# Patient Record
Sex: Male | Born: 1935 | Race: White | Hispanic: No | Smoking: Former smoker
Health system: Southern US, Community
[De-identification: ages and names within clinical notes are randomized; demographics above are authoritative.]

## PROBLEM LIST (undated history)

## (undated) DIAGNOSIS — R269 Unspecified abnormalities of gait and mobility: Secondary | ICD-10-CM

## (undated) DIAGNOSIS — R42 Dizziness and giddiness: Secondary | ICD-10-CM

## (undated) DIAGNOSIS — I6529 Occlusion and stenosis of unspecified carotid artery: Secondary | ICD-10-CM

## (undated) DIAGNOSIS — E785 Hyperlipidemia, unspecified: Secondary | ICD-10-CM

## (undated) DIAGNOSIS — G7000 Myasthenia gravis without (acute) exacerbation: Secondary | ICD-10-CM

## (undated) DIAGNOSIS — M545 Low back pain, unspecified: Secondary | ICD-10-CM

## (undated) DIAGNOSIS — I10 Essential (primary) hypertension: Secondary | ICD-10-CM

## (undated) DIAGNOSIS — R609 Edema, unspecified: Secondary | ICD-10-CM

## (undated) DIAGNOSIS — R600 Localized edema: Secondary | ICD-10-CM

## (undated) DIAGNOSIS — H9190 Unspecified hearing loss, unspecified ear: Secondary | ICD-10-CM

## (undated) DIAGNOSIS — Z923 Personal history of irradiation: Secondary | ICD-10-CM

## (undated) DIAGNOSIS — N400 Enlarged prostate without lower urinary tract symptoms: Secondary | ICD-10-CM

## (undated) DIAGNOSIS — C3490 Malignant neoplasm of unspecified part of unspecified bronchus or lung: Secondary | ICD-10-CM

## (undated) DIAGNOSIS — I8290 Acute embolism and thrombosis of unspecified vein: Secondary | ICD-10-CM

## (undated) DIAGNOSIS — I739 Peripheral vascular disease, unspecified: Secondary | ICD-10-CM

## (undated) DIAGNOSIS — N200 Calculus of kidney: Secondary | ICD-10-CM

## (undated) DIAGNOSIS — M1990 Unspecified osteoarthritis, unspecified site: Secondary | ICD-10-CM

## (undated) HISTORY — DX: Unspecified hearing loss, unspecified ear: H91.90

## (undated) HISTORY — DX: Hyperlipidemia, unspecified: E78.5

## (undated) HISTORY — DX: Low back pain: M54.5

## (undated) HISTORY — PX: CATARACT EXTRACTION: SUR2

## (undated) HISTORY — DX: Localized edema: R60.0

## (undated) HISTORY — DX: Low back pain, unspecified: M54.50

## (undated) HISTORY — DX: Unspecified abnormalities of gait and mobility: R26.9

## (undated) HISTORY — DX: Malignant neoplasm of unspecified part of unspecified bronchus or lung: C34.90

## (undated) HISTORY — DX: Dizziness and giddiness: R42

## (undated) HISTORY — DX: Occlusion and stenosis of unspecified carotid artery: I65.29

## (undated) HISTORY — DX: Edema, unspecified: R60.9

## (undated) HISTORY — DX: Benign prostatic hyperplasia without lower urinary tract symptoms: N40.0

## (undated) HISTORY — DX: Myasthenia gravis without (acute) exacerbation: G70.00

## (undated) HISTORY — PX: THYROIDECTOMY, PARTIAL: SHX18

## (undated) HISTORY — PX: APPENDECTOMY: SHX54

## (undated) HISTORY — DX: Personal history of irradiation: Z92.3

## (undated) HISTORY — DX: Calculus of kidney: N20.0

## (undated) HISTORY — DX: Essential (primary) hypertension: I10

## (undated) HISTORY — DX: Peripheral vascular disease, unspecified: I73.9

## (undated) HISTORY — DX: Unspecified osteoarthritis, unspecified site: M19.90

## (undated) HISTORY — DX: Acute embolism and thrombosis of unspecified vein: I82.90

---

## 1999-07-30 ENCOUNTER — Encounter: Admission: RE | Admit: 1999-07-30 | Discharge: 1999-10-28 | Payer: Self-pay | Admitting: Orthopedic Surgery

## 2006-05-14 ENCOUNTER — Emergency Department (HOSPITAL_COMMUNITY): Admission: EM | Admit: 2006-05-14 | Discharge: 2006-05-14 | Payer: Self-pay | Admitting: Emergency Medicine

## 2006-10-04 ENCOUNTER — Emergency Department (HOSPITAL_COMMUNITY): Admission: EM | Admit: 2006-10-04 | Discharge: 2006-10-04 | Payer: Self-pay | Admitting: Emergency Medicine

## 2006-10-07 ENCOUNTER — Emergency Department (HOSPITAL_COMMUNITY): Admission: EM | Admit: 2006-10-07 | Discharge: 2006-10-07 | Payer: Self-pay | Admitting: Emergency Medicine

## 2006-10-08 ENCOUNTER — Emergency Department (HOSPITAL_COMMUNITY): Admission: EM | Admit: 2006-10-08 | Discharge: 2006-10-08 | Payer: Self-pay | Admitting: Emergency Medicine

## 2007-02-23 ENCOUNTER — Encounter: Admission: RE | Admit: 2007-02-23 | Discharge: 2007-02-23 | Payer: Self-pay | Admitting: Family Medicine

## 2007-04-21 ENCOUNTER — Ambulatory Visit: Payer: Self-pay | Admitting: Vascular Surgery

## 2007-10-27 ENCOUNTER — Ambulatory Visit: Payer: Self-pay | Admitting: Vascular Surgery

## 2008-05-03 ENCOUNTER — Ambulatory Visit: Payer: Self-pay | Admitting: Vascular Surgery

## 2008-05-27 ENCOUNTER — Emergency Department (HOSPITAL_COMMUNITY): Admission: EM | Admit: 2008-05-27 | Discharge: 2008-05-27 | Payer: Self-pay | Admitting: Emergency Medicine

## 2008-11-01 ENCOUNTER — Ambulatory Visit: Payer: Self-pay | Admitting: Vascular Surgery

## 2010-09-02 ENCOUNTER — Encounter
Admission: RE | Admit: 2010-09-02 | Discharge: 2010-09-17 | Payer: Self-pay | Source: Home / Self Care | Attending: Internal Medicine | Admitting: Internal Medicine

## 2010-09-19 ENCOUNTER — Ambulatory Visit: Payer: Medicare Other | Attending: Internal Medicine | Admitting: Physical Therapy

## 2010-09-19 DIAGNOSIS — IMO0001 Reserved for inherently not codable concepts without codable children: Secondary | ICD-10-CM | POA: Insufficient documentation

## 2010-09-19 DIAGNOSIS — R269 Unspecified abnormalities of gait and mobility: Secondary | ICD-10-CM | POA: Insufficient documentation

## 2010-09-19 DIAGNOSIS — R279 Unspecified lack of coordination: Secondary | ICD-10-CM | POA: Insufficient documentation

## 2010-09-24 ENCOUNTER — Ambulatory Visit: Payer: Medicare Other | Admitting: Physical Therapy

## 2010-09-26 ENCOUNTER — Ambulatory Visit: Payer: Medicare Other | Admitting: Physical Therapy

## 2010-10-01 ENCOUNTER — Ambulatory Visit: Payer: Medicare Other | Admitting: Physical Therapy

## 2010-10-03 ENCOUNTER — Ambulatory Visit: Payer: Medicare Other | Admitting: Physical Therapy

## 2010-10-31 ENCOUNTER — Encounter (INDEPENDENT_AMBULATORY_CARE_PROVIDER_SITE_OTHER): Payer: Medicare Other

## 2010-10-31 ENCOUNTER — Ambulatory Visit (INDEPENDENT_AMBULATORY_CARE_PROVIDER_SITE_OTHER): Payer: Medicare Other | Admitting: Vascular Surgery

## 2010-10-31 DIAGNOSIS — I70219 Atherosclerosis of native arteries of extremities with intermittent claudication, unspecified extremity: Secondary | ICD-10-CM

## 2010-10-31 DIAGNOSIS — I739 Peripheral vascular disease, unspecified: Secondary | ICD-10-CM

## 2010-11-01 NOTE — Assessment & Plan Note (Signed)
OFFICE VISIT  [name], [name] DOB:  [birth year]                                       10/31/2010 [id number]#:[id number]  The patient is a 77 year old male who returns for followup today for evaluation of peripheral arterial disease.  He was last seen in March of 2010.  At that time he had minimal symptoms.  Currently he has no calf claudication symptoms.  However, he does describe a cramping sensation in his hips with lengthy walking.  He also has some unsteadiness of gait and balance issues.  He denies any nonhealing wounds on his feet.  He has no history of rest pain.  CHRONIC MEDICAL PROBLEMS:  Continue to remain hypertension and elevated cholesterol.  These are followed by Dr. Clovis Riley and Dr. Thea Silversmith and are currently stable.  PAST SURGICAL HISTORY:  Subtotal thyroid, appendectomy, cataract removal, treatment for macular degeneration and a detached retina.  SOCIAL HISTORY:  He is widowed.  He has 3 children.  He is retired.  He is a former smoker but quit 40 years ago.  He is not consume alcohol regularly.  FAMILY HISTORY:  Not remarkable for vascular disease at early age.  REVIEW OF SYSTEMS:  Full 12 point review of systems was performed with the patient today.  He did complain of problems with an enlarged prostate as well as decline in his eyesight and hearing and some occasional dizziness.  All other systems were negative.  PHYSICAL EXAM:  Vital signs:  Blood pressure is 174/61 in the left arm, oxygen saturation is 98%, heart rate 62.  HEENT:  Unremarkable.  Neck: Has 2+ carotid pulses without bruit.  Chest:  Clear to auscultation. Cardiac:  Regular rate and rhythm without murmur.  Abdomen:  Soft, nontender, nondistended.  No masses.  Musculoskeletal:  Shows no major obvious joint deformities.  Lower extremity vascular exam:  He has 2+ femoral pulses bilaterally.  He has absent popliteal and pedal pulses.  He had bilateral ABIs performed  today which were 1.03 on the left, 0.75 on the right.  These are essentially unchanged from March of 2010. Overall the patient is fairly stable in his symptoms.  He does have some complaints which sound similar to hip claudication but also has some instability of gait which I believe is probably related to his age.  I believe the best option at this point is to continue conservative management and he will have repeat ABIs performed in 6 months' time.    Janetta Hora. Jawaun Celmer, MD Electronically Signed  CEF/MEDQ  D:  11/01/2010  T:  11/01/2010  Job:  [id number]  cc:   [name], M.D. Elsworth Soho, M.D.

## 2010-12-31 NOTE — Assessment & Plan Note (Signed)
OFFICE VISIT   Chirico, Kenan A  DOB:  [birth year]                                       10/27/2007  UEAVW#:[id number]   Patient is seen for followup today for claudication.  He was last seen  in 09/08.  He currently states that he is experiencing claudication  symptoms in his right leg at approximately 200 yards.  This is not very  bothersome to him.  He is able to still bowl twice weekly and is limited  only minimally by his walking distance.  His atherosclerotic risk  factors continue to include age and hypertension.  He is a former smoker  but quit in 1972.   PHYSICAL EXAMINATION:  Blood pressure 154/67 in the left arm.  Heart  rate 72 and regular.  Abdomen is soft and nontender with no mass.  He  has 2+ femoral pulses bilaterally.  He has a 1+ left popliteal, 1+ left  posterior tibial, and 2+ dorsalis pedis pulse.  In the right leg he has  no palpable popliteal or pedal pulses.  There are no ulcerations on the  feet.  Feet are pink, warm, and adequately perfused bilaterally.   He had bilateral ABIs today which were 0.64 on the right and 0.95 on the  left.   Since the patient is not really bothered by his symptoms and currently  is not at risk of limb loss, I believe the best management is continued  risk factor modification and surveillance.  We will see him again in six  months time for repeat ABIs.   Janetta Hora. Fields, MD  Electronically Signed   CEF/MEDQ  D:  10/27/2007  T:  10/28/2007  Job:  [id number]

## 2010-12-31 NOTE — Assessment & Plan Note (Signed)
OFFICE VISIT   Marvin [name], Marvin [name]  DOB:  01/16/1914                                       04/21/2007  [id number]#:[id number]   Patient is [name] 77 year old male who had some coolness of his right foot in  June of this year.  At that time, he also had some aching in his calf  when ambulating 25 yards or more.  He states that since that time, his  symptoms have improved considerably.  He is now walking at his normal  pace and has no symptoms at all at 30 yards.  He states that the  coolness in his foot has completely resolved, and the temperature in  both feet is symmetric.  He denies any rest pain.  He denies any  nonhealing wounds.  Atherosclerotic risk factors include age and  hypertension.  He denies [name] history of diabetes or coronary artery  disease.  He is [name] former smoker but quit in 1972.   Otherwise, past surgical history is remarkable for multiple eye  surgeries with [name] vitrectomy and cataracts.  He has also had appendectomy  and thyroidectomy.   MEDICATIONS:  1. Hydrochlorothiazide 1/2 tablet once daily.  2. Flomax once daily.  3. Prednisolone eye drops 1% twice daily.   FAMILY HISTORY:  Noncontributory.   SOCIAL HISTORY:  He is widowed and has three children.  Smoking history  is as above.  He does not consume alcohol regularly.   REVIEW OF SYSTEMS:  He is 5 feet 8 and 185 pounds.  He denies [name] history  of chest pain, shortness of breath, asthma, wheezing, GI bleeding.  He  has had [name] history of [name] few urinary tract infections.  He denies [name]  history of stroke, TIA, syncopal episodes.  He has had some decrease in  visual acuity recently.   PHYSICAL EXAMINATION:  Blood pressure is 160/94, heart rate 76 and  regular.  HEENT is unremarkable.  Neck has 2+ carotid pulses without  bruits.  Chest is clear to auscultation.  Cardiac exam reveals [name] regular  rate and rhythm.  Abdomen is soft and nontender with no pulsatile mass.  He has 2+ radial, 2+ femoral  pulses bilaterally.  On the right leg, he  has absent popliteal and pedal pulses.  The foot is pink, warm, well  perfused, and symmetric in temperature to the left.  On the left leg, he  has 1+ popliteal, [name] 2+ left posterior tibial pulse, and absent dorsalis  pedis pulse.   He had recent ABIs at [facility], which were normal in the left  leg and greater than 1.  The right was 0.49.   Patient most likely had subacute occlusion of his right superficial  femoral artery; however, his symptoms have almost completely resolved at  this point.  He certainly may have some claudication symptoms long term,  but he currently does not have lifestyle limiting claudication.  He  seems to be very satisfied with his current walking distance.  I believe  the best management for him would be conservative in nature.  We will  start him on aspirin once daily.  He will also follow up with me in six  months time for repeat ABIs or sooner if his symptoms return or become  more disabling to him.   Janetta Hora. Fields, MD  Electronically Signed  CEF/MEDQ  D:  04/26/2007  T:  04/26/2007  Job:  [id number]   cc:   L. Lupe Carney, M.D.

## 2010-12-31 NOTE — Assessment & Plan Note (Signed)
OFFICE VISIT   Marvin [name], Marvin [name]  DOB:  August 07, 1914                                       11/01/2008  UEAVW#:[id number]    The patient is [name] 77 year old male who I previously saw in September of  2009 for mild claudication symptoms.  He states that currently he has no  claudication symptoms whatsoever.  His atherosclerotic risk factors  continue to remain age and hypertension.  He is [name] former smoker but quit  in 1972.  He denies any complaints of rest pain or nonhealing  ulcerations.   PHYSICAL EXAMINATION:  Vital signs:  On physical exam blood pressure is  173/70 in the left arm, pulse 72 and regular.  HEENT:  Unremarkable.  Neck:  Has 2+ carotid pulse without bruit.  Chest:  Clear to  auscultation.  Cardiac:  Regular rate and rhythm without murmur.  Abdomen:  Soft, nontender, nondistended with no masses.  Extremities:  He has 2+ femoral pulses bilaterally.  He has [name] 2+ dorsalis pedis and  posterior tibial pulse in the left foot.  He has absent popliteal and  pedal pulses on the right foot.  There are no ulcerations on the feet.  There is no lower extremity edema.   He had repeat bilateral ABIs today which were 0.92 on the left and 0.68  on the right.  I reassured the patient today that he is not at risk of  imminent limb loss.  Although his ABIs are mildly decreased bilaterally  he currently is experiencing no symptoms of claudication.  I feel the  best management for now is continued observation with risk factor  modification of his hypertension.  He is continuing to keep [name] log of his  blood pressure medication for his primary care doctor.  He will follow  up with me in 1 year for repeat ABIs.   Janetta Hora. Fields, MD  Electronically Signed   CEF/MEDQ  D:  11/01/2008  T:  11/02/2008  Job:  [id number]   cc:   Dr Ronne Binning

## 2010-12-31 NOTE — Assessment & Plan Note (Signed)
OFFICE VISIT   Julin, Delyle A  DOB:  [birth year]                                       05/03/2008  [id number]#:[id number]   The patient is a 76 year old male who I have been following for mild to  moderate claudication symptoms.  He was last seen in March of 2009.  At  that time he was having complaints of claudication type symptoms in his  right leg at approximately 200 yards.  He states that his symptoms are  essentially the same.  He currently is able to go approximately 200  yards or a quarter of a mile before having pain.  He states his right  and left legs both kind of give out at the same time at this point.  He  still goes bowling twice a week and has no problems with this.  He  denies any rest pain.   PHYSICAL EXAMINATION:  Vital signs:  On physical exam today blood  pressure is 183/78 in the left arm, pulse is 67 and regular.  HEENT:  Unremarkable.  He has 2+ carotid pulses without bruit.  Chest:  Clear to  auscultation.  Cardiac:  Exam is regular rate and rhythm without murmur.  Abdomen:  Soft, nontender.  Extremities:  He has 2+ femoral pulses  bilaterally.  He has a 1+ left popliteal and 1+ left dorsalis pedis  pulse.  He has absent popliteal and pedal pulses on the right side.   MEDICATIONS:  1. His medications continue to include hydrochlorothiazide 12.5 mg      once a day.  2. Aspirin once daily.  3. Flomax.  4. Centrum multivitamin.   His ABIs today were 1.0 on the left and 0.56 on the right.  This is  essentially unchanged from 6 months ago.  I discussed with the patient  that he again currently is not at risk for limb loss.  He overall is  fairly satisfied with his walking distance and states this is his  primary nuisance to him.  He is not really interested in any  intervention at this time.  We will continue to follow him and treat him  with risk factor modification and continued walking.  He will follow up  with me in 6 months'  time for repeat ABIs.   Janetta Hora. Fields, MD  Electronically Signed   CEF/MEDQ  D:  05/03/2008  T:  05/04/2008  Job:  [id number]   cc:   L. Lupe Carney, M.D.

## 2011-01-15 HISTORY — PX: TRANSTHORACIC ECHOCARDIOGRAM: SHX275

## 2011-02-16 HISTORY — PX: RETINAL DETACHMENT SURGERY: SHX105

## 2011-02-24 ENCOUNTER — Ambulatory Visit (HOSPITAL_COMMUNITY)
Admission: RE | Admit: 2011-02-24 | Discharge: 2011-02-24 | Disposition: A | Discharge status: Home / Self Care | Payer: Medicare Other | Source: Ambulatory Visit | Attending: Ophthalmology | Admitting: Ophthalmology

## 2011-02-24 ENCOUNTER — Ambulatory Visit (HOSPITAL_COMMUNITY): Payer: Medicare Other

## 2011-02-24 DIAGNOSIS — I059 Rheumatic mitral valve disease, unspecified: Secondary | ICD-10-CM | POA: Insufficient documentation

## 2011-02-24 DIAGNOSIS — Z01812 Encounter for preprocedural laboratory examination: Secondary | ICD-10-CM | POA: Insufficient documentation

## 2011-02-24 DIAGNOSIS — Z79899 Other long term (current) drug therapy: Secondary | ICD-10-CM | POA: Insufficient documentation

## 2011-02-24 DIAGNOSIS — I10 Essential (primary) hypertension: Secondary | ICD-10-CM | POA: Insufficient documentation

## 2011-02-24 DIAGNOSIS — H33029 Retinal detachment with multiple breaks, unspecified eye: Secondary | ICD-10-CM | POA: Insufficient documentation

## 2011-02-24 DIAGNOSIS — Z01818 Encounter for other preprocedural examination: Secondary | ICD-10-CM | POA: Insufficient documentation

## 2011-02-24 DIAGNOSIS — I739 Peripheral vascular disease, unspecified: Secondary | ICD-10-CM | POA: Insufficient documentation

## 2011-02-24 DIAGNOSIS — E785 Hyperlipidemia, unspecified: Secondary | ICD-10-CM | POA: Insufficient documentation

## 2011-02-24 DIAGNOSIS — H543 Unqualified visual loss, both eyes: Secondary | ICD-10-CM | POA: Insufficient documentation

## 2011-02-24 DIAGNOSIS — Z01810 Encounter for preprocedural cardiovascular examination: Secondary | ICD-10-CM | POA: Insufficient documentation

## 2011-02-24 DIAGNOSIS — H3520 Other non-diabetic proliferative retinopathy, unspecified eye: Secondary | ICD-10-CM | POA: Insufficient documentation

## 2011-02-24 DIAGNOSIS — H34239 Retinal artery branch occlusion, unspecified eye: Secondary | ICD-10-CM | POA: Insufficient documentation

## 2011-02-24 DIAGNOSIS — H3340 Traction detachment of retina, unspecified eye: Secondary | ICD-10-CM | POA: Insufficient documentation

## 2011-02-24 LAB — CBC
MCV: 84.1 fL (ref 78.0–100.0)
Platelets: 199 10*3/uL (ref 150–400)
RBC: 4.35 MIL/uL (ref 4.22–5.81)
RDW: 13.5 % (ref 11.5–15.5)
WBC: 9.5 10*3/uL (ref 4.0–10.5)

## 2011-02-24 LAB — BASIC METABOLIC PANEL
BUN: 30 mg/dL — ABNORMAL HIGH (ref 6–23)
CO2: 27 mEq/L (ref 19–32)
Calcium: 9.2 mg/dL (ref 8.4–10.5)
Creatinine, Ser: 1.5 mg/dL — ABNORMAL HIGH (ref 0.50–1.35)
GFR calc Af Amer: 53 mL/min — ABNORMAL LOW (ref >60)
Sodium: 138 mEq/L (ref 135–145)

## 2011-02-24 LAB — SURGICAL PCR SCREEN: MRSA, PCR: NEGATIVE

## 2011-03-26 NOTE — Op Note (Signed)
NAMEANUP, BRIGHAM NO.:  [id number]  MEDICAL RECORD NO.:  [id number]  LOCATION:  [facility]                         FACILITY:  [facility]  PHYSICIAN:  Jillyn Hidden A. Haylynn Pha, M.D.   DATE OF BIRTH:  November 03, 1913  DATE OF PROCEDURE:  02/24/2011 DATE OF DISCHARGE:  02/24/2011                              OPERATIVE REPORT   PREOPERATIVE DIAGNOSES: 1. 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 2. 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  POSTOPERATIVE DIAGNOSES: 1. 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 2. 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 3. Branch retinal artery occlusion, left eye with vision loss.  PROCEDURES: 1. Posterior vitrectomy with membrane peel - left eye - 20 gauge     vitrectomy with complex repair of retinal detachment via     vitrectomy, endolaser photocoagulation, air-fluid exchange,     retinectomy, and insertion of silicone oil permanent 5000     centistokes. 2. Removal of nonmagnetic foreign body - silicone oil from prior     surgery, left eye.  SURGEON:  Alford Highland. Perris Conwell, MD.  ANESTHESIA:  Local retrobulbar with anesthesia control.  INDICATIONS FOR PROCEDURE:  The patient is a 77 year old alert self- sufficient man who has had retinal detachment repair via vitrectomy and endolaser and silicone oil who has recurrent proliferative vitreoretinopathy, now with an inferior and inferotemporal nasal detachment anterior to the equator threatening progression in vision loss involving the macula and profound vision loss.  The patient understands this is an attempt to reattach peripheral retina so as to allow for functioning and preservation of corrective visual acuity in the left eye.   He understands the grim nature of this process and the requirement for removal of the silicone oil and likely reinstallation of silicone oil on using on a long term basis since the size entering the stage of internal scarring and recurrent retinal detachments.  The patient understands the risk of anesthesia including recurrence, death, loss of the eye including but not limited to hemorrhage, infection, scarring, need for another surgery, no change in vision, loss of vision, progressive disease despite intervention.  Appropriate signed consent was obtained.  The patient was taken to the operating room.  In the operating room, appropriate monitors followed by mild sedation. Proper site selection was confirmed with the operating room staff. Thereafter the left periocular region sterilely prepped and draped in usual sterile fashion.  After under mild sedation 2% Xylocaine 5 mL was injected to the retrobulbar with additional 5 mL laterally in fashion of modified Darel Hong.  The right periocular region was sterilely prepped and draped at this time.  Lid speculum was applied.  Conjunctival peritomy was fashioned at each of three quadrants except the inferonasal.  A 20-gauge infusion cannula was secured inferotemporal quadrant in place and the vitreous cavity verified visually.  This was then turned on, secured with 5-0 Mersilene.  Superior sclerotomies fashioned with a 20-gauge MVR blade.  Silicone oil  was evacuated using extraction techniques with an 18-gauge Silastic cannula attached to the cath.  The 1000 centistoke hole was removed in this fashion.  There were multiple three fluid exchange were performed so as to mobilize all the small, partially emulsified silicone oil.  At this time under fluid, epiretinal dissection was really attempted but not necessary because there is no epiretinal tissues of PVR but more intraretinal fibrous contraction inferior and inferonasally.  Tenting the  foreshortened retina required inferior retinotomy fashion from the 6 o'clock position over to the 8:30 position inferonasally.  This freed the retina and relaxed the retina so as to allow to flatten.  At this time to maintain retinal flattening and macular attached which stayed attached throughout the procedure, Perfluoron was injected with flattened posterior pole allowed for removal of subretinal fluid peripherally through the incision of the retinotomy and careful drying on multiple occasions and thereafter the Perfluoron was aspirated from the eye.  The retina remained nicely attached.  Endolaser photocoagulation placed retinopexy fashioned inferiorly, inferonasally, superiorly and temporally.  No complications occurred.  Superotemporal sclerotomy was then closed with 7-0 Vicryl and preplaced 7-0 Vicryl sutures placed superonasally and then under passive conditions, 5000 centistoke oil was injected to the vitreous cavity to feel the appropriate fill.  At this time this sclerotomy was then closed with 7-0 Vicryl suture and thereafter the infusion removed and similarly closed with 7-0 Vicryl.  Conjunctivae closed with 7-0 Vicryl.  The surface of the eye was irrigated copiously with balanced salt solution.  Conjunctivae was then closed with 7-0 Vicryl.  Subconjunctival Decadron applied.  Sterile patch Fox shield applied.  The patient tolerated the procedure without complication. Topical TobraDex was applied.  The patient was taken to the PACU in good stable condition.     Alford Highland Tamario Heal, M.D.     GAR/MEDQ  D:  02/24/2011  T:  02/25/2011  Job:  [id number]  Electronically Signed by Fawn Kirk M.D. on 03/26/2011 02:48:32 PM

## 2011-04-19 HISTORY — PX: OTHER SURGICAL HISTORY: SHX169

## 2011-05-08 ENCOUNTER — Other Ambulatory Visit (INDEPENDENT_AMBULATORY_CARE_PROVIDER_SITE_OTHER): Payer: Medicare Other | Admitting: *Deleted

## 2011-05-08 ENCOUNTER — Encounter (INDEPENDENT_AMBULATORY_CARE_PROVIDER_SITE_OTHER): Payer: Medicare Other | Admitting: *Deleted

## 2011-05-08 DIAGNOSIS — I6529 Occlusion and stenosis of unspecified carotid artery: Secondary | ICD-10-CM

## 2011-05-08 DIAGNOSIS — I739 Peripheral vascular disease, unspecified: Secondary | ICD-10-CM

## 2011-05-16 NOTE — Procedures (Unsigned)
CAROTID DUPLEX EXAM  INDICATION:  Right ICA stenosis.  HISTORY: Diabetes:  No. Cardiac:  No. Hypertension:  Yes. Smoking:  Previous. Previous Surgery:  No. CV History:  History of visual disturbances. Amaurosis Fugax No, Paresthesias No, Hemiparesis No.                                      RIGHT             LEFT Brachial systolic pressure:         163               161 Brachial Doppler waveforms:         Normal            Normal Vertebral direction of flow:        Antegrade DUPLEX VELOCITIES (cm/sec) CCA peak systolic                   77 ECA peak systolic                   89 ICA peak systolic                   433 ICA end diastolic                   126 PLAQUE MORPHOLOGY:                  Mixed PLAQUE AMOUNT:                      Severe PLAQUE LOCATION:                    ICA/CCA  IMPRESSION:  Doppler velocities suggest an 80% to 99 % stenosis of the right proximal to mid internal carotid artery.  An office appointment was made with myself following the completion of this examination.  ___________________________________________ Janetta Hora Fields, MD  CH/MEDQ  D:  05/12/2011  T:  05/12/2011  Job:  [id number]

## 2011-05-20 ENCOUNTER — Encounter: Payer: Self-pay | Admitting: Vascular Surgery

## 2011-05-21 ENCOUNTER — Encounter: Payer: Self-pay | Admitting: Vascular Surgery

## 2011-05-22 ENCOUNTER — Ambulatory Visit (INDEPENDENT_AMBULATORY_CARE_PROVIDER_SITE_OTHER): Payer: Medicare Other | Admitting: Vascular Surgery

## 2011-05-22 ENCOUNTER — Encounter: Payer: Self-pay | Admitting: Vascular Surgery

## 2011-05-22 VITALS — BP 186/67 | HR 71 | Resp 20 | Ht 67.0 in | Wt 194.5 lb

## 2011-05-22 DIAGNOSIS — I739 Peripheral vascular disease, unspecified: Secondary | ICD-10-CM | POA: Insufficient documentation

## 2011-05-22 DIAGNOSIS — I6529 Occlusion and stenosis of unspecified carotid artery: Secondary | ICD-10-CM

## 2011-05-22 DIAGNOSIS — I70219 Atherosclerosis of native arteries of extremities with intermittent claudication, unspecified extremity: Secondary | ICD-10-CM

## 2011-05-22 NOTE — Progress Notes (Signed)
[facility] [place] HISTORY AND PHYSICAL   History of Present Illness:  Patient is a 77 y.o. year old male who presents for follow-up evaluation for carotid stenosis.  He is on Aspirin for antiplatelet therapy.  His atherosclerotic risk factors are PAD, elevated cholesterol, hypertension, remote smoking.  These are all currently stable and followed by his primary care physician.  He denies any new neurologic events including amaurosis, numbness, or weakness.  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  Past Medical History  Diagnosis Date  . Hyperlipidemia   . Hypertension   . Dizziness   . Blood clot in vein   . Arthritis   . Peripheral arterial disease   . Carotid artery occlusion     Past Surgical History  Procedure Date  . Appendectomy   . Cataract extraction     bilateral  . Retinal detachment surgery   . Thyroidectomy, partial     ROS: [x]  Positive   [ ]  Negative   [ ]  All sytems reviewed and are negative  Neurologic: as above Cardiac:denies shortness of breath or chest pain Pulmonary: denies cough or wheeze  Social History History  Substance Use Topics  . Smoking status: Former Smoker -- 1.5 packs/day for 40 years    Types: Cigarettes  . Smokeless tobacco: Former Neurosurgeon    Quit date: 08/18/1970  . Alcohol Use: No    Allergies  No Known Allergies   Current Outpatient Prescriptions  Medication Sig Dispense Refill  . AMLODIPINE BESYLATE PO Take 2.5 mg by mouth daily.       Marland Kitchen aspirin EC 325 MG tablet Take 325 mg by mouth daily.        . hydrochlorothiazide (HYDRODIURIL) 25 MG tablet Take 12.5 mg by mouth daily.        . Multiple Vitamin (MULTIVITAMIN) capsule Take 1 capsule by mouth daily.        . simvastatin (ZOCOR) 40 MG tablet Take 40 mg by mouth at bedtime.        . TAMSULOSIN HCL PO Take by mouth daily.        . Vitamin D, Ergocalciferol, (DRISDOL) 50000 UNITS CAPS Take 50,000 Units by  mouth every 7 (seven) days.          Physical Examination  Filed Vitals:   05/22/11 1054  BP: 186/67  Pulse: 71  Resp: 20  Height: 5\' 7"  (1.702 m)  Weight: 194 lb 8 oz (88.225 kg)    Body mass index is 30.46 kg/(m^2).  General:  Alert and oriented, no acute distress HEENT: Normal Neck: No bruit or JVD Pulmonary: Clear to auscultation bilaterally Cardiac: Regular Rate and Rhythm without murmur Neurologic: Upper and lower extremity motor 5/5 and symmetric Extremities: 2+ femoral pulses bilaterally, absent popliteal and pedal pulses Skin: No ulcers or rash  DATA: Carotid duplex exam dated September 20 is reviewed which shows a greater than 80% right internal carotid artery stenosis with peak systolic velocity of 433 cm/s. This confirms a previous carotid duplex exam performed at [facility] and vascular on 01/15/2011 which also showed greater than 80% stenosis on the right side and a 50-70% left internal carotid artery stenosis   ASSESSMENT: High-grade right internal carotid artery stenosis, asymptomatic. Risks benefits possible complications and procedure details right carotid endarterectomy were explained to the patient today. I also explained to the patient the possible continued medical management of this and risk of stroke.  We also discussed the long-term benefit that he would have to live several years past the operation to achieve full benefit of this. He wishes to discuss the possibility of an operation with his children prior to scheduling this. He will get back to [place] if he wishes to proceed with carotid endarterectomy in the future.   PLAN: #1 PAD patient needs bilateral ABIs in 1 year  #2 asymptomatic high-grade right carotid stenosis, patient to continue his antiplatelet therapy. He will call if he wishes to schedule her right carotid endarterectomy. If he wishes to have an operation he will need cardiac evaluation for risk stratification prior to this. If she opts  to not have carotid endarterectomy at this time he should have a carotid duplex scan with his ABIs at the next visit.

## 2011-07-23 ENCOUNTER — Telehealth: Payer: Self-pay | Admitting: *Deleted

## 2011-07-23 NOTE — Telephone Encounter (Signed)
Called pt regarding scheduling Right CEA and he said to tell Dr Darrick Penna he definitely does not want surgery.

## 2012-04-06 ENCOUNTER — Emergency Department (HOSPITAL_COMMUNITY)
Admission: EM | Admit: 2012-04-06 | Discharge: 2012-04-06 | Disposition: A | Discharge status: Home / Self Care | Payer: Medicare Other | Attending: Emergency Medicine | Admitting: Emergency Medicine

## 2012-04-06 ENCOUNTER — Emergency Department (HOSPITAL_COMMUNITY): Payer: Medicare Other

## 2012-04-06 ENCOUNTER — Encounter (HOSPITAL_COMMUNITY): Payer: Self-pay | Admitting: Emergency Medicine

## 2012-04-06 DIAGNOSIS — Z7982 Long term (current) use of aspirin: Secondary | ICD-10-CM | POA: Insufficient documentation

## 2012-04-06 DIAGNOSIS — I10 Essential (primary) hypertension: Secondary | ICD-10-CM | POA: Insufficient documentation

## 2012-04-06 DIAGNOSIS — Z79899 Other long term (current) drug therapy: Secondary | ICD-10-CM | POA: Insufficient documentation

## 2012-04-06 DIAGNOSIS — D649 Anemia, unspecified: Secondary | ICD-10-CM | POA: Insufficient documentation

## 2012-04-06 DIAGNOSIS — Z87891 Personal history of nicotine dependence: Secondary | ICD-10-CM | POA: Insufficient documentation

## 2012-04-06 DIAGNOSIS — Z8739 Personal history of other diseases of the musculoskeletal system and connective tissue: Secondary | ICD-10-CM | POA: Insufficient documentation

## 2012-04-06 DIAGNOSIS — E785 Hyperlipidemia, unspecified: Secondary | ICD-10-CM | POA: Insufficient documentation

## 2012-04-06 DIAGNOSIS — M25559 Pain in unspecified hip: Secondary | ICD-10-CM | POA: Insufficient documentation

## 2012-04-06 DIAGNOSIS — M549 Dorsalgia, unspecified: Secondary | ICD-10-CM | POA: Insufficient documentation

## 2012-04-06 DIAGNOSIS — Z9089 Acquired absence of other organs: Secondary | ICD-10-CM | POA: Insufficient documentation

## 2012-04-06 LAB — CBC
HCT: 32.3 % — ABNORMAL LOW (ref 39.0–52.0)
MCH: 29.1 pg (ref 26.0–34.0)
Platelets: 195 10*3/uL (ref 150–400)
WBC: 7.4 10*3/uL (ref 4.0–10.5)

## 2012-04-06 LAB — BASIC METABOLIC PANEL
CO2: 27 mEq/L (ref 19–32)
Calcium: 8.9 mg/dL (ref 8.4–10.5)
Chloride: 102 mEq/L (ref 96–112)
GFR calc Af Amer: 40 mL/min — ABNORMAL LOW (ref >90)
GFR calc non Af Amer: 34 mL/min — ABNORMAL LOW (ref >90)
Glucose, Bld: 114 mg/dL — ABNORMAL HIGH (ref 70–99)
Potassium: 4 mEq/L (ref 3.5–5.1)
Sodium: 137 mEq/L (ref 135–145)

## 2012-04-06 LAB — URINALYSIS, MICROSCOPIC ONLY: Urobilinogen, UA: 0.2 mg/dL (ref 0.0–1.0)

## 2012-04-06 MED ORDER — HYDROCODONE-ACETAMINOPHEN 5-325 MG PO TABS: 1.0000 | ORAL_TABLET | Freq: Four times a day (QID) | ORAL | Status: AC | PRN

## 2012-04-06 MED ORDER — MORPHINE SULFATE 4 MG/ML IJ SOLN
6.0000 mg | Freq: Once | INTRAMUSCULAR | Status: AC
  Administered 2012-04-06: 6 mg via INTRAVENOUS
  Filled 2012-04-06 (×2): qty 1

## 2012-04-06 NOTE — ED Provider Notes (Addendum)
History     CSN: [id number]  Arrival date & time 04/06/12  1331   First MD Initiated Contact with Patient 04/06/12 1503      CC: back pain   HPI Comments: The pain is located in the right lateral back area around the superior iliac crest area, lower flank.  It seems to shoot toward her hip.  Patient is a 78 y.o. male presenting with flank pain. The history is provided by the patient.  Flank Pain This is a new problem. Episode onset: He has been having pain off an on for the last week or two but today developed pain about an hour ago that was more severe. Episode frequency: It seems to come and go frequently in spasms. Pertinent negatives include no chest pain, no abdominal pain, no headaches and no shortness of breath. Associated symptoms comments: No numbness or weakness, no fever, no injuries . Exacerbated by: Walking and activity increases the pain.  lifting his leg up increases the pain. Nothing relieves the symptoms. He has tried nothing for the symptoms. The treatment provided no relief.    Past Medical History  Diagnosis Date  . Hyperlipidemia   . Hypertension   . Dizziness   . Blood clot in vein   . Arthritis   . Peripheral arterial disease   . Carotid artery occlusion     Past Surgical History  Procedure Date  . Appendectomy   . Cataract extraction     bilateral  . Retinal detachment surgery   . Thyroidectomy, partial     Family History  Problem Relation Age of Onset  . Cancer Mother   . Pneumonia Father   . Stroke Sister     History  Substance Use Topics  . Smoking status: Former Smoker -- 1.5 packs/day for 40 years    Types: Cigarettes  . Smokeless tobacco: Former Neurosurgeon    Quit date: 08/18/1970  . Alcohol Use: No      Review of Systems  Respiratory: Negative for shortness of breath.   Cardiovascular: Negative for chest pain.  Gastrointestinal: Negative for abdominal pain.  Genitourinary: Positive for flank pain.  Neurological: Negative for  headaches.  All other systems reviewed and are negative.    Allergies  Review of patient's allergies indicates no known allergies.  Home Medications   Current Outpatient Rx  Name Route Sig Dispense Refill  . AMLODIPINE BESYLATE 2.5 MG PO TABS Oral Take 2.5 mg by mouth daily.    . ASPIRIN EC 325 MG PO TBEC Oral Take 325 mg by mouth daily.      Marland Kitchen HYDROCHLOROTHIAZIDE 25 MG PO TABS Oral Take 12.5 mg by mouth daily.      . IBUPROFEN 200 MG PO TABS Oral Take 400 mg by mouth every 6 (six) hours as needed.    . ADULT MULTIVITAMIN W/MINERALS CH Oral Take 1 tablet by mouth daily.    Marland Kitchen SIMVASTATIN 40 MG PO TABS Oral Take 40 mg by mouth at bedtime.      . TAMSULOSIN HCL 0.4 MG PO CAPS Oral Take 0.4 mg by mouth at bedtime.    Marland Kitchen VITAMIN D (ERGOCALCIFEROL) 50000 UNITS PO CAPS Oral Take 50,000 Units by mouth every 7 (seven) days. On Sundays      BP 206/69  Pulse 105  Temp 98.2 F (36.8 C) (Oral)  SpO2 96%  Physical Exam  Nursing note and vitals reviewed. Constitutional: He appears well-developed and well-nourished. No distress.  HENT:  Head: Normocephalic and atraumatic.  Right Ear: External ear normal.  Left Ear: External ear normal.  Eyes: Conjunctivae are normal. Right eye exhibits no discharge. Left eye exhibits no discharge. No scleral icterus.  Neck: Neck supple. No tracheal deviation present.  Cardiovascular: Normal rate, regular rhythm and intact distal pulses.   Pulmonary/Chest: Effort normal and breath sounds normal. No stridor. No respiratory distress. He has no wheezes. He has no rales.  Abdominal: Soft. Bowel sounds are normal. He exhibits no distension. There is no tenderness. There is no rebound and no guarding.  Musculoskeletal: He exhibits no edema and no tenderness.  Neurological: He is alert. He has normal strength. No sensory deficit. Cranial nerve deficit:  no gross defecits noted. He exhibits normal muscle tone. He displays no seizure activity. Coordination normal.    Skin: Skin is warm and dry. No rash noted.  Psychiatric: He has a normal mood and affect.    ED Course  Procedures (including critical care time)  Labs Reviewed  CBC - Abnormal; Notable for the following:    RBC 3.78 (*)     Hemoglobin 11.0 (*)     HCT 32.3 (*)     All other components within normal limits  BASIC METABOLIC PANEL - Abnormal; Notable for the following:    Glucose, Bld 114 (*)     BUN 32 (*)     Creatinine, Ser 1.61 (*)     GFR calc non Af Amer 34 (*)     GFR calc Af Amer 40 (*)     All other components within normal limits  URINALYSIS, WITH MICROSCOPIC - Abnormal; Notable for the following:    Leukocytes, UA TRACE (*)     All other components within normal limits   Dg Lumbar Spine Complete  04/06/2012  *RADIOLOGY REPORT*  Clinical Data: 78 year old with right low back pain radiating to the right hip.  LUMBAR SPINE - COMPLETE 4+ VIEW  Comparison: CT abdomen pelvis 05/14/2006.  Findings: Normal lumbar segmentation.  Vertebral body height and alignment appears stable.  Stable disc spaces.  Chronic endplate spurring maximal at L2-L3 and L3-L4.  Grossly negative sacral ala and SI joints.  Chronic calcified atherosclerosis of the aorta and iliac arteries. Infrarenal abdominal aortic aneurysm based on mural calcification now measures up to 35 mm AP (previously 27 mm).  IMPRESSION: 1.  Chronic infrarenal abdominal aortic aneurysm has increased since 2007,  35 mm AP dimension based on these images. 2. No acute osseous abnormality in the lumbar spine.   Original Report Authenticated By: Harley Hallmark, M.D.    Dg Hip Complete Right  04/06/2012  *RADIOLOGY REPORT*  Clinical Data: 78 year old male with pain radiating to the right hip.  No known injury.  RIGHT HIP - COMPLETE 2+ VIEW  Comparison: CT pelvis 05/14/2006.  Findings: Femoral heads are normally located.  Joint spaces are preserved. Bone mineralization is within normal limits for age.  The pelvis appears intact.  Proximal  right femur intact.  Calcified atherosclerosis extending into both lower extremities.  IMPRESSION: No acute osseous abnormality identified about the right hip or pelvis.  .   Original Report Authenticated By: Harley Hallmark, M.D.       MDM  The patient is feeling better after repeat examination. I palpated his abdomen again and there is no evidence of a pulsatile mass he has no tenderness. I discussed the findings of the abdominal aortic aneurysm noted on the plain films. Considering the fact that he is not having any abdominal pain, this  pain is positional and related to movement of his leg, and the fact that it is moderate in nature I do not feel that the aneurysm is the source of his pain. I discussed treatment at home and encouraged him to follow up with his primary Dr. The patient understands and agrees        Celene Kras, MD 04/06/12 1805  HTN noted.  Pt asymptomatic.  Follow up with PCP  Celene Kras, MD 04/06/12 [phone]

## 2012-04-06 NOTE — ED Notes (Signed)
Bed:WHALA<BR> Expected date:04/06/12<BR> Expected time: 1:20 PM<BR> Means of arrival:Ambulance<BR> Comments:<BR> Elderly, hip pain

## 2012-04-06 NOTE — ED Notes (Addendum)
To ED by [facility], medic [phone] with complaint of right flank pain. Started about an hour ago. Moves from leg to hip but no leg pain per EMS. Pain rated a 7, spasm like pain. History of high blood pressure

## 2012-05-18 DIAGNOSIS — C3490 Malignant neoplasm of unspecified part of unspecified bronchus or lung: Secondary | ICD-10-CM

## 2012-05-18 HISTORY — DX: Malignant neoplasm of unspecified part of unspecified bronchus or lung: C34.90

## 2012-05-26 ENCOUNTER — Other Ambulatory Visit: Payer: Self-pay | Admitting: Neurology

## 2012-05-26 DIAGNOSIS — H02409 Unspecified ptosis of unspecified eyelid: Secondary | ICD-10-CM

## 2012-05-26 DIAGNOSIS — G733 Myasthenic syndromes in other diseases classified elsewhere: Secondary | ICD-10-CM

## 2012-05-26 DIAGNOSIS — G63 Polyneuropathy in diseases classified elsewhere: Secondary | ICD-10-CM

## 2012-05-26 DIAGNOSIS — D518 Other vitamin B12 deficiency anemias: Secondary | ICD-10-CM

## 2012-05-26 DIAGNOSIS — R6889 Other general symptoms and signs: Secondary | ICD-10-CM

## 2012-05-28 ENCOUNTER — Ambulatory Visit
Admission: RE | Admit: 2012-05-28 | Discharge: 2012-05-28 | Disposition: A | Discharge status: Home / Self Care | Payer: Medicare Other | Source: Ambulatory Visit | Attending: Neurology | Admitting: Neurology

## 2012-05-28 ENCOUNTER — Other Ambulatory Visit: Payer: Self-pay | Admitting: Neurology

## 2012-05-28 DIAGNOSIS — R6889 Other general symptoms and signs: Secondary | ICD-10-CM

## 2012-05-28 DIAGNOSIS — D518 Other vitamin B12 deficiency anemias: Secondary | ICD-10-CM

## 2012-05-28 DIAGNOSIS — G63 Polyneuropathy in diseases classified elsewhere: Secondary | ICD-10-CM

## 2012-05-28 DIAGNOSIS — H02409 Unspecified ptosis of unspecified eyelid: Secondary | ICD-10-CM

## 2012-05-28 DIAGNOSIS — G733 Myasthenic syndromes in other diseases classified elsewhere: Secondary | ICD-10-CM

## 2012-06-01 ENCOUNTER — Other Ambulatory Visit (HOSPITAL_COMMUNITY): Payer: Self-pay | Admitting: Neurology

## 2012-06-01 DIAGNOSIS — R918 Other nonspecific abnormal finding of lung field: Secondary | ICD-10-CM

## 2012-06-02 DIAGNOSIS — I8290 Acute embolism and thrombosis of unspecified vein: Secondary | ICD-10-CM | POA: Insufficient documentation

## 2012-06-02 DIAGNOSIS — R911 Solitary pulmonary nodule: Secondary | ICD-10-CM | POA: Insufficient documentation

## 2012-06-02 DIAGNOSIS — N400 Enlarged prostate without lower urinary tract symptoms: Secondary | ICD-10-CM | POA: Insufficient documentation

## 2012-06-02 DIAGNOSIS — G7000 Myasthenia gravis without (acute) exacerbation: Secondary | ICD-10-CM | POA: Insufficient documentation

## 2012-06-02 DIAGNOSIS — R269 Unspecified abnormalities of gait and mobility: Secondary | ICD-10-CM | POA: Insufficient documentation

## 2012-06-02 DIAGNOSIS — N200 Calculus of kidney: Secondary | ICD-10-CM | POA: Insufficient documentation

## 2012-06-02 DIAGNOSIS — I10 Essential (primary) hypertension: Secondary | ICD-10-CM | POA: Insufficient documentation

## 2012-06-02 DIAGNOSIS — I739 Peripheral vascular disease, unspecified: Secondary | ICD-10-CM | POA: Insufficient documentation

## 2012-06-02 DIAGNOSIS — E785 Hyperlipidemia, unspecified: Secondary | ICD-10-CM | POA: Insufficient documentation

## 2012-06-02 DIAGNOSIS — M1990 Unspecified osteoarthritis, unspecified site: Secondary | ICD-10-CM | POA: Insufficient documentation

## 2012-06-02 DIAGNOSIS — I6529 Occlusion and stenosis of unspecified carotid artery: Secondary | ICD-10-CM | POA: Insufficient documentation

## 2012-06-03 ENCOUNTER — Institutional Professional Consult (permissible substitution) (INDEPENDENT_AMBULATORY_CARE_PROVIDER_SITE_OTHER): Payer: Medicare Other | Admitting: Cardiothoracic Surgery

## 2012-06-03 ENCOUNTER — Encounter: Payer: Self-pay | Admitting: Cardiothoracic Surgery

## 2012-06-03 ENCOUNTER — Ambulatory Visit: Payer: Medicare Other | Attending: Radiation Oncology | Admitting: Radiation Oncology

## 2012-06-03 VITALS — BP 132/63 | HR 76 | Resp 18 | Ht 67.0 in | Wt 195.0 lb

## 2012-06-03 DIAGNOSIS — R911 Solitary pulmonary nodule: Secondary | ICD-10-CM

## 2012-06-05 NOTE — Progress Notes (Signed)
[address]            [place] [address]          [phone]      [facility] Medical Record #[id number] Date of Birth: [birth year]  Referring: York Spaniel, MD Primary Care: [name], MD  Chief Complaint:    Chief Complaint  Patient presents with  . Lung Lesion    MTOC/ Referral from Dr Anne Hahn for surgical eval on Left upper lobe nodule     History of Present Illness:    Patient recently dx with ocular myasthenia . During evaluation for this a ct of chest was done to check for mediastinal mass/thyoma.  Patient is a distant smoker, quit in 1975. Denies any cough or hemoptysis.    Current Activity/ Functional Status: Patient is independent with mobility/ambulation, transfers, ADL's, IADL's.   Past Medical History  Diagnosis Date  . Hyperlipidemia   . Hypertension   . Dizziness   . Blood clot in vein   . Arthritis   . Peripheral arterial disease   . Carotid artery occlusion   . Ocular myasthenia gravis   . BPH (benign prostatic hyperplasia)   . Nephrolithiasis   . Low back pain   . Gait disorder     Past Surgical History  Procedure Date  . Appendectomy   . Cataract extraction     bilateral  . Retinal detachment surgery   . Thyroidectomy, partial     Family History  Problem Relation Age of Onset  . Cancer Mother   . Pneumonia Father   . Stroke Sister     History   Social History  . Marital Status: Widowed    Spouse Name: N/A    Number of Children: 3  . Years of Education: N/A   Occupational History  .     Social History Main Topics  . Smoking status: Former Smoker -- 1.5 packs/day for 40 years    Types: Cigarettes  . Smokeless tobacco: Former Neurosurgeon    Quit date: 08/18/1970  . Alcohol Use: No  . Drug Use: No               History  Smoking status  . Former Smoker -- 1.5 packs/day for 40 years  . Types: Cigarettes  Smokeless tobacco  . Former Neurosurgeon  . Quit date:  08/18/1970    History  Alcohol Use No     No Known Allergies  Current Outpatient Prescriptions  Medication Sig Dispense Refill  . amLODipine (NORVASC) 2.5 MG tablet Take 2.5 mg by mouth daily.      Marland Kitchen aspirin EC 325 MG tablet Take 325 mg by mouth daily.        . Calcium Carbonate (CALTRATE 600 PO) Take by mouth 2 (two) times daily.      . hydrochlorothiazide (HYDRODIURIL) 25 MG tablet Take 12.5 mg by mouth daily.        Marland Kitchen ibuprofen (ADVIL,MOTRIN) 200 MG tablet Take 400 mg by mouth every 6 (six) hours as needed.      . Multiple Vitamin (MULTIVITAMIN WITH MINERALS) TABS Take 1 tablet by mouth daily.      . predniSONE (DELTASONE) 5 MG tablet Take 5 mg by mouth daily. Taper doses pack      . pyridostigmine (MESTINON) 60 MG tablet Take 60 mg by mouth 3 (three) times daily.      Marland Kitchen  simvastatin (ZOCOR) 40 MG tablet Take 40 mg by mouth at bedtime.        . Tamsulosin HCl (FLOMAX) 0.4 MG CAPS Take 0.4 mg by mouth at bedtime.      . Vitamin D, Ergocalciferol, (DRISDOL) 50000 UNITS CAPS Take 50,000 Units by mouth every 7 (seven) days. On Sundays           Review of Systems:     Cardiac Review of Systems: Y or N  Chest Pain [n    ]  Resting SOB [ n  ] Exertional SOB  Cove.Etienne  ]  Orthopnea Milo.Brash  ]   Pedal Edema [ n  ]    Palpitations [ n ] Syncope  [n  ]   Presyncope [ n  ]  General Review of Systems: [Y] = yes [  ]=no Constitional: recent weight change [ y ]; anorexia [  ]; fatigue [ y ]; nausea [ n ]; night sweats [ n ]; fever [n  ]; or chills [n  ];                                                                                                                                          Dental: poor dentition[ n ]  Eye : blurred vision [  ]; diplopia [   ]; vision changes [blind in rt eye , poor vision in left  ];  Amaurosis fugax[  ]; Resp: cough [n  ];  wheezing[  ];  hemoptysis[  ]; shortness of breath[  ]; paroxysmal nocturnal dyspnea[  ]; dyspnea on exertion[ y ]; or orthopnea[  ];  GI:   gallstones[  ], vomiting[  ];  dysphagia[  ]; melena[  ];  hematochezia [  ]; heartburn[  ];   Hx of  Colonoscopy[  ]; GU: kidney stones [  ]; hematuria[  ];   dysuria [  ];  nocturia[  ];  history of     obstruction [  ];             Skin: rash, swelling[  ];, hair loss[  ];  peripheral edema[  ];  or itching[  ]; Musculosketetal: myalgias[  ];  joint swelling[  ];  joint erythema[  ];  joint pain[  ];  back pain[  ];  Heme/Lymph: bruising[  ];  bleeding[  ];  anemia[  ];  Neuro: TIA[n  ];  headaches[n  ];  stroke[ n ];  vertigo[ n ];  seizures[n  ];   paresthesias[n  ];  difficulty walking[y  ]; Eye lids drooping  Psych:depression[n  ]; anxiety[n  ];  Endocrine: diabetes[  ];  thyroid dysfunction[  ];  Immunizations: Flu [  ]; Pneumococcal[  ]; no immunizations recorded in EPIC  Other:  Physical Exam: BP 132/63  Pulse 76  Resp 18  Ht 5\' 7"  (1.702 m)  Wt  195 lb (88.451 kg)  BMI 30.54 kg/m2  SpO2 94%  General appearance: alert, cooperative, appears stated age, fatigued, no distress, mildly obese and unable to keep left eye lid open , hold rt open Neurologic: intact except for drooping eye lids Heart: regular rate and rhythm, S1, S2 normal, no murmur, click, rub or gallop and normal apical impulse Lungs: clear to auscultation bilaterally and normal percussion bilaterally Abdomen: soft, non-tender; bowel sounds normal; no masses,  no organomegaly Extremities: extremities normal, atraumatic, no cyanosis or edema and Homans sign is negative, no sign of DVT no cervical or axillary adenopathy, no carotid bruits   Diagnostic Studies & Laboratory data:     Recent Radiology Findings:   Ct Chest Wo Contrast  05/28/2012  *RADIOLOGY REPORT*  Clinical Data: Myasthenia syndrome.  Former smoker.  CT CHEST WITHOUT CONTRAST  Technique:  Multidetector CT imaging of the chest was performed following the standard protocol without IV contrast.  BUN and creatinine were obtained on site at [facility]  Imaging at [address] Results:  BUN 26 mg/dL,  Creatinine 1.6 mg/dL.  Comparison: None.  Findings: Right lobe of the thyroid is asymmetrically enlarged and contains areas of calcification.  No pathologically enlarged mediastinal or axillary lymph nodes.  Hilar regions are difficult to definitively evaluate without IV contrast.  Atherosclerotic calcification of the arterial vasculature including coronary arteries.  Heart size normal.  No pericardial effusion.  There is a spiculated nodule in the apical left upper lobe, measuring 2.0 x 2.0 cm (image 16).  Vague areas of ground-glass are seen in the extreme apex of the left upper lobe (example image 7) and are nonspecific.  Scattered tiny pulmonary nodules measure less than 4 mm in size bilaterally.  Nodular areas of ground-glass are seen in the right upper lobe (image 23) and right lower lobe (image 19).  No pleural fluid.  Airway is unremarkable.  Incidental imaging of the upper abdomen shows pneumobilia.  A low attenuation lesion in the right kidney measures 2.1 cm.  No worrisome lytic or sclerotic lesions.  IMPRESSION:  1.  Spiculated left upper lobe nodule is most consistent with primary bronchogenic carcinoma. These results will be called to the ordering clinician or representative by the Radiologist Assistant, and communication documented in the PACS Dashboard. 2.  Additional areas of ground glass nodularity bilaterally may represent sites of low grade adenocarcinoma. 3.  A few scattered tiny pulmonary nodules are nonspecific. 4.  No anterior mediastinal mass.   Original Report Authenticated By: Reyes Ivan, M.D.    Recent Lab Findings: Lab Results  Component Value Date   WBC 7.4 04/06/2012   HGB 11.0* 04/06/2012   HCT 32.3* 04/06/2012   PLT 195 04/06/2012   GLUCOSE 114* 04/06/2012   NA 137 04/06/2012   K 4.0 04/06/2012   CL 102 04/06/2012   CREATININE 1.61* 04/06/2012   BUN 32* 04/06/2012   CO2 27 04/06/2012      Assessment / Plan:    Spiculated left upper lobe nodule is most consistent with primary bronchogenic carcinoma. Additional areas of ground glass nodularity bilaterally may represent sites of low grade adenocarcinoma Ocular Myasthenia, no evidence of Anterior mediastinal mass on CT  Blind rt eye Carotid Stenosis rt by duplex 80-99% Renal Insufficiency, cr 1.6 Patient has been scheduled for PET Scan, after which will make recommendations for further studies. Can consider stereotatic radiotherapy as treatment option depending on additional studies. Will need needle bx before treatment I discussed the ct findings,  possibility of Lung cancer and need for further studies before deciding on treatment plan Case presented at [facility] conference.  Delight Ovens MD  Beeper [phone] Office [phone] 06/05/2012 5:04 PM

## 2012-06-07 ENCOUNTER — Encounter (HOSPITAL_COMMUNITY)
Admission: RE | Admit: 2012-06-07 | Discharge: 2012-06-07 | Disposition: A | Discharge status: Home / Self Care | Payer: Medicare Other | Source: Ambulatory Visit | Attending: Neurology | Admitting: Neurology

## 2012-06-07 DIAGNOSIS — D381 Neoplasm of uncertain behavior of trachea, bronchus and lung: Secondary | ICD-10-CM | POA: Insufficient documentation

## 2012-06-07 DIAGNOSIS — R918 Other nonspecific abnormal finding of lung field: Secondary | ICD-10-CM

## 2012-06-07 DIAGNOSIS — R222 Localized swelling, mass and lump, trunk: Secondary | ICD-10-CM | POA: Insufficient documentation

## 2012-06-07 LAB — GLUCOSE, CAPILLARY: Glucose-Capillary: 94 mg/dL (ref 70–99)

## 2012-06-07 MED ORDER — FLUDEOXYGLUCOSE F - 18 (FDG) INJECTION
18.7000 | Freq: Once | INTRAVENOUS | Status: AC | PRN
  Administered 2012-06-07: 18.7 via INTRAVENOUS

## 2012-06-08 ENCOUNTER — Other Ambulatory Visit: Payer: Self-pay | Admitting: Cardiothoracic Surgery

## 2012-06-08 DIAGNOSIS — D381 Neoplasm of uncertain behavior of trachea, bronchus and lung: Secondary | ICD-10-CM

## 2012-06-10 ENCOUNTER — Other Ambulatory Visit: Payer: Self-pay | Admitting: Radiology

## 2012-06-14 ENCOUNTER — Ambulatory Visit (HOSPITAL_COMMUNITY)
Admission: RE | Admit: 2012-06-14 | Discharge: 2012-06-14 | Disposition: A | Discharge status: Home / Self Care | Payer: Medicare Other | Source: Ambulatory Visit | Attending: Cardiothoracic Surgery | Admitting: Cardiothoracic Surgery

## 2012-06-14 ENCOUNTER — Ambulatory Visit (HOSPITAL_COMMUNITY)
Admission: RE | Admit: 2012-06-14 | Discharge: 2012-06-14 | Disposition: A | Discharge status: Home / Self Care | Payer: Medicare Other | Source: Ambulatory Visit | Attending: Interventional Radiology | Admitting: Interventional Radiology

## 2012-06-14 ENCOUNTER — Other Ambulatory Visit (HOSPITAL_COMMUNITY): Payer: Self-pay | Admitting: Interventional Radiology

## 2012-06-14 ENCOUNTER — Encounter (HOSPITAL_COMMUNITY): Payer: Self-pay

## 2012-06-14 DIAGNOSIS — J984 Other disorders of lung: Secondary | ICD-10-CM | POA: Insufficient documentation

## 2012-06-14 DIAGNOSIS — Z9889 Other specified postprocedural states: Secondary | ICD-10-CM

## 2012-06-14 DIAGNOSIS — D381 Neoplasm of uncertain behavior of trachea, bronchus and lung: Secondary | ICD-10-CM

## 2012-06-14 DIAGNOSIS — IMO0002 Reserved for concepts with insufficient information to code with codable children: Secondary | ICD-10-CM | POA: Insufficient documentation

## 2012-06-14 DIAGNOSIS — J988 Other specified respiratory disorders: Secondary | ICD-10-CM | POA: Insufficient documentation

## 2012-06-14 LAB — CBC
Hemoglobin: 12.8 g/dL — ABNORMAL LOW (ref 13.0–17.0)
MCH: 29.2 pg (ref 26.0–34.0)
MCHC: 34.2 g/dL (ref 30.0–36.0)
MCV: 85.4 fL (ref 78.0–100.0)
Platelets: 228 10*3/uL (ref 150–400)
RBC: 4.38 MIL/uL (ref 4.22–5.81)

## 2012-06-14 LAB — PROTIME-INR: Prothrombin Time: 11.9 seconds (ref 11.6–15.2)

## 2012-06-14 MED ORDER — SODIUM CHLORIDE 0.9 % IV SOLN
INTRAVENOUS | Status: DC
  Administered 2012-06-14: 10:00:00 via INTRAVENOUS

## 2012-06-14 MED ORDER — FENTANYL CITRATE 0.05 MG/ML IJ SOLN
INTRAMUSCULAR | Status: AC | PRN
  Administered 2012-06-14: 50 ug via INTRAVENOUS
  Administered 2012-06-14 (×2): 25 ug via INTRAVENOUS

## 2012-06-14 MED ORDER — FENTANYL CITRATE 0.05 MG/ML IJ SOLN
INTRAMUSCULAR | Status: AC
  Filled 2012-06-14: qty 4

## 2012-06-14 MED ORDER — MIDAZOLAM HCL 2 MG/2ML IJ SOLN
INTRAMUSCULAR | Status: AC
  Filled 2012-06-14: qty 4

## 2012-06-14 MED ORDER — MIDAZOLAM HCL 2 MG/2ML IJ SOLN
INTRAMUSCULAR | Status: AC | PRN
  Administered 2012-06-14 (×2): 1 mg via INTRAVENOUS

## 2012-06-14 NOTE — H&P (Signed)
Chief Complaint: Left lung mass Referring Physician:Gerhardt HPI: Marvin [name] is an 78 y.o. male found to have a left upper lung mass.  He has seen Thoracic surgery, who has recommended Perc biopsy to get tissue diagnosis.  He otherwise feels well. No new c/o today. No recent illnesses.  Past Medical History:  Past Medical History  Diagnosis Date  . Hyperlipidemia   . Hypertension   . Dizziness   . Blood clot in vein   . Arthritis   . Peripheral arterial disease   . Carotid artery occlusion   . Ocular myasthenia gravis   . BPH (benign prostatic hyperplasia)   . Nephrolithiasis   . Low back pain   . Gait disorder     Past Surgical History:  Past Surgical History  Procedure Date  . Appendectomy   . Cataract extraction     bilateral  . Retinal detachment surgery   . Thyroidectomy, partial     Family History:  Family History  Problem Relation Age of Onset  . Cancer Mother   . Pneumonia Father   . Stroke Sister     Social History:  reports that he has quit smoking. His smoking use included Cigarettes. He has a 60 pack-year smoking history. He quit smokeless tobacco use about 41 years ago. He reports that he does not drink alcohol or use illicit drugs.  Allergies: No Known Allergies  Medications: Reviewed med list, no anticoagulants  Please HPI for pertinent positives, otherwise complete 10 system ROS negative.  Physical Exam: Blood pressure 140/66, pulse 61, temperature 98.8 F (37.1 C), resp. rate 16, SpO2 97.00%. There is no height or weight on file to calculate BMI.   General Appearance:  Alert, cooperative, no distress, appears stated age  Head:  Normocephalic, without obvious abnormality, atraumatic  ENT: Unremarkable  Neck: Supple, symmetrical, trachea midline, no adenopathy, thyroid: not enlarged, symmetric, no tenderness/mass/nodules  Lungs:   Clear to auscultation bilaterally, no w/r/r, respirations unlabored without use of accessory muscles.    Chest Wall:  No tenderness or deformity  Heart:  Regular rate and rhythm, S1, S2 normal, no murmur, rub or gallop. Carotids 2+ without bruit.  Abdomen:   Soft, non-tender, non distended. Bowel sounds active all four quadrants,  no masses, no organomegaly.  Neurologic: Normal affect, no gross deficits.   Results for orders placed during the hospital encounter of 06/14/12 (from the past 48 hour(s))  APTT     Status: Normal   Collection Time   06/14/12  9:39 AM      Component Value Range Comment   aPTT 24  24 - 37 seconds   CBC     Status: Abnormal   Collection Time   06/14/12  9:39 AM      Component Value Range Comment   WBC 13.9 (*) 4.0 - 10.5 K/uL    RBC 4.38  4.22 - 5.81 MIL/uL    Hemoglobin 12.8 (*) 13.0 - 17.0 g/dL    HCT 04.5 (*) 40.9 - 52.0 %    MCV 85.4  78.0 - 100.0 fL    MCH 29.2  26.0 - 34.0 pg    MCHC 34.2  30.0 - 36.0 g/dL    RDW 81.1  91.4 - 78.2 %    Platelets 228  150 - 400 K/uL   PROTIME-INR     Status: Normal   Collection Time   06/14/12  9:39 AM      Component Value Range Comment   Prothrombin Time  11.9  11.6 - 15.2 seconds    INR 0.88  0.00 - 1.49    No results found.  Assessment/Plan LUL lung mass For CT guided biopsy today Labs reviewed. Pt has been on Prednisone dose pack, likely accounting for elevated WBC as no other infectious source evident. Discussed procedure and risks Consent signed in chart  Brayton El PA-C 06/14/2012, 11:46 AM

## 2012-06-14 NOTE — H&P (Signed)
Agree with PA note.  Will proceed with CT guided core biopsy of LUL nodule.  Signed,  Sterling Big, MD Vascular & Interventional Radiologist [facility] Radiology

## 2012-06-14 NOTE — Progress Notes (Addendum)
06/14/2012 to [facility] via stretcher from radiology with no complaints.  Respirations easy.  Color good , skin warm and dry.  On oxygen at 2 liters per nasal canula per order with oxygen saturation at 98%.

## 2012-06-14 NOTE — Procedures (Signed)
Interventional Radiology Procedure Note  Procedure: CT guided biopsy of LUL pulmonary nodule Complications: Mild pulmonary hemorrhage, hemoptysis Recommendations: - Bedrest until CXR cleared.  Minimize talking, coughing or otherwise straining.  - Follow up 2 hr CXR pending  - If 2 hr CXR negative for PTX, pt may advance diet and ambulate  Signed,  Sterling Big, MD Vascular & Interventional Radiologist [facility] Radiology

## 2012-06-15 ENCOUNTER — Telehealth (HOSPITAL_COMMUNITY): Payer: Self-pay | Admitting: *Deleted

## 2012-06-15 NOTE — Telephone Encounter (Signed)
Radiology post lung biopsy phone call.  Pt doing well with no problems or complaints.  Minimal hemoptysis.

## 2012-06-17 ENCOUNTER — Encounter: Payer: Self-pay | Admitting: *Deleted

## 2012-06-17 ENCOUNTER — Ambulatory Visit
Admission: RE | Admit: 2012-06-17 | Discharge: 2012-06-17 | Disposition: A | Discharge status: Home / Self Care | Payer: Medicare Other | Source: Ambulatory Visit | Attending: Radiation Oncology | Admitting: Radiation Oncology

## 2012-06-17 ENCOUNTER — Encounter: Payer: Self-pay | Admitting: Cardiothoracic Surgery

## 2012-06-17 ENCOUNTER — Ambulatory Visit (INDEPENDENT_AMBULATORY_CARE_PROVIDER_SITE_OTHER): Payer: Medicare Other | Admitting: Cardiothoracic Surgery

## 2012-06-17 ENCOUNTER — Encounter: Payer: Self-pay | Admitting: Radiation Oncology

## 2012-06-17 VITALS — BP 123/56 | HR 83 | Resp 18 | Ht 67.0 in | Wt 195.0 lb

## 2012-06-17 DIAGNOSIS — C3490 Malignant neoplasm of unspecified part of unspecified bronchus or lung: Secondary | ICD-10-CM

## 2012-06-17 DIAGNOSIS — R911 Solitary pulmonary nodule: Secondary | ICD-10-CM

## 2012-06-17 DIAGNOSIS — C3410 Malignant neoplasm of upper lobe, unspecified bronchus or lung: Secondary | ICD-10-CM | POA: Insufficient documentation

## 2012-06-17 NOTE — Progress Notes (Signed)
Spoke with pt and family member at [facility] today.  Educational/resource information given and explained.  Distress and nutrition screening completed.  Questions or concerns addressed

## 2012-06-17 NOTE — Progress Notes (Signed)
Radiation Oncology         [phone]) [phone] ________________________________  Initial outpatient Consultation  Name: Marvin [name] MRN: [id number]  Date: 06/17/2012  DOB: 01/29/1914  KV:[id number],FIEPP, MD  Delight Ovens, MD   REFERRING PHYSICIAN: Delight Ovens, MD  DIAGNOSIS: The encounter diagnosis was Malignant neoplasm of bronchus and lung, unspecified site.  HISTORY OF PRESENT ILLNESS::Marvin [name] is a 78 y.o. male who is seen out of the courtesy of Dr. Tyrone Sage for an opinion concerning radiation therapy as part of management of patient's recently diagnosed clinical stage I non-small cell lung cancer. Patient presented earlier this year with ocular myasthenia gravis. As part of his workup a chest x-ray/ct scan was performed. This revealed a small lesion approximately 2 cm in size in the left upper lobe region. PET scan was performed which showed isolated activity in this lesion. He did proceed to undergo biopsy of this area with pathology significant for adenocarcinoma consistent with lung primary. Patient was seen by Dr. Tyrone Sage after his diagnosis. Given the patient's age and his other medical issues, the patient does not wish to proceed with surgical intervention.   PREVIOUS RADIATION THERAPY: No  PAST MEDICAL HISTORY:  has a past medical history of Hyperlipidemia; Hypertension; Dizziness; Blood clot in vein; Arthritis; Peripheral arterial disease; Carotid artery occlusion; Ocular myasthenia gravis; BPH (benign prostatic hyperplasia); Nephrolithiasis; Low back pain; and Gait disorder.    PAST SURGICAL HISTORY: Past Surgical History  Procedure Date  . Appendectomy   . Cataract extraction     bilateral  . Retinal detachment surgery   . Thyroidectomy, partial     FAMILY HISTORY: family history includes Cancer in his mother; Pneumonia in his father; and Stroke in his sister.  SOCIAL HISTORY:  reports that he has quit smoking. His smoking use included  Cigarettes. He has a 60 pack-year smoking history. He quit smokeless tobacco use about 41 years ago. He reports that he does not drink alcohol or use illicit drugs.  ALLERGIES: Review of patient's allergies indicates no known allergies.  MEDICATIONS:  Current Outpatient Prescriptions  Medication Sig Dispense Refill  . amLODipine (NORVASC) 2.5 MG tablet Take 2.5 mg by mouth daily.      Marland Kitchen aspirin EC 325 MG tablet Take 325 mg by mouth daily.        . Calcium Carbonate (CALTRATE 600 PO) Take by mouth 2 (two) times daily.      . hydrochlorothiazide (HYDRODIURIL) 25 MG tablet Take 12.5 mg by mouth daily.        Marland Kitchen ibuprofen (ADVIL,MOTRIN) 200 MG tablet Take 400 mg by mouth every 6 (six) hours as needed.      . Multiple Vitamin (MULTIVITAMIN WITH MINERALS) TABS Take 1 tablet by mouth daily.      . predniSONE (DELTASONE) 5 MG tablet Take 5 mg by mouth daily. Taper doses pack      . pyridostigmine (MESTINON) 60 MG tablet Take 60 mg by mouth 3 (three) times daily.      . simvastatin (ZOCOR) 40 MG tablet Take 40 mg by mouth at bedtime.        . Tamsulosin HCl (FLOMAX) 0.4 MG CAPS Take 0.4 mg by mouth at bedtime.      . Vitamin D, Ergocalciferol, (DRISDOL) 50000 UNITS CAPS Take 50,000 Units by mouth every 7 (seven) days. On Sundays        REVIEW OF SYSTEMS:  A 15 point review of systems is documented in the electronic medical record. This  was obtained by the nursing staff. However, I reviewed this with the patient to discuss relevant findings and make appropriate changes. He denies any pain in the chest region. He had some mild hemoptysis after his biopsy. He denies any breathing problems or swallowing difficulties. He denies any headaches. He has visual problems related to her retinal detachment and more recently related to his myasthenia gravis. He continues to have ptosis of his left eyelid.   PHYSICAL EXAM:  height is 5\' 7"  (1.702 m) and weight is 195 lb (88.451 kg). His blood pressure is 123/56 and his  pulse is 83. His respiration is 18 and oxygen saturation is 96%.  this is a very pleasant elderly gentleman in no acute distress. He is accompanied by one of his daughters on evaluation today. The patient has bilateral hearing aids in place. He has ptosis of the left eye. The extraocular eye movements appeared to be intact bilaterally. Examination of oral cavity reveals patient to be edentulous is without any secondary infection. Examination of the neck and supraclavicular region reveals no evidence of adenopathy. The axillary areas are free of adenopathy. Examination of the lungs reveals them to be clear. The heart has regular rhythm and rate. The abdomen is soft and nontender with normal bowel sounds. On neurological examination motor strength is 5 out of 5 in the proximal and distal muscle groups in the upper lower extremities.   LABORATORY DATA:  Lab Results  Component Value Date   WBC 13.9* 06/14/2012   HGB 12.8* 06/14/2012   HCT 37.4* 06/14/2012   MCV 85.4 06/14/2012   PLT 228 06/14/2012   Lab Results  Component Value Date   NA 137 04/06/2012   K 4.0 04/06/2012   CL 102 04/06/2012   CO2 27 04/06/2012   No results found for this basename: ALT, AST, GGT, ALKPHOS, BILITOT     RADIOGRAPHY: Ct Chest Wo Contrast  05/28/2012  *RADIOLOGY REPORT*  Clinical Data: Myasthenia syndrome.  Former smoker.  CT CHEST WITHOUT CONTRAST  Technique:  Multidetector CT imaging of the chest was performed following the standard protocol without IV contrast.  BUN and creatinine were obtained on site at [facility] at [address] Results:  BUN 26 mg/dL,  Creatinine 1.6 mg/dL.  Comparison: None.  Findings: Right lobe of the thyroid is asymmetrically enlarged and contains areas of calcification.  No pathologically enlarged mediastinal or axillary lymph nodes.  Hilar regions are difficult to definitively evaluate without IV contrast.  Atherosclerotic calcification of the arterial vasculature including  coronary arteries.  Heart size normal.  No pericardial effusion.  There is a spiculated nodule in the apical left upper lobe, measuring 2.0 x 2.0 cm (image 16).  Vague areas of ground-glass are seen in the extreme apex of the left upper lobe (example image 7) and are nonspecific.  Scattered tiny pulmonary nodules measure less than 4 mm in size bilaterally.  Nodular areas of ground-glass are seen in the right upper lobe (image 23) and right lower lobe (image 19).  No pleural fluid.  Airway is unremarkable.  Incidental imaging of the upper abdomen shows pneumobilia.  A low attenuation lesion in the right kidney measures 2.1 cm.  No worrisome lytic or sclerotic lesions.  IMPRESSION:  1.  Spiculated left upper lobe nodule is most consistent with primary bronchogenic carcinoma. These results will be called to the ordering clinician or representative by the Radiologist Assistant, and communication documented in the PACS Dashboard. 2.  Additional areas of ground  glass nodularity bilaterally may represent sites of low grade adenocarcinoma. 3.  A few scattered tiny pulmonary nodules are nonspecific. 4.  No anterior mediastinal mass.   Original Report Authenticated By: Reyes Ivan, M.D.    Nm Pet Image Initial (pi) Skull Base To Thigh  06/07/2012  *RADIOLOGY REPORT*  Clinical Data: Initial treatment strategy for lung nodule.  NUCLEAR MEDICINE PET SKULL BASE TO THIGH  Fasting Blood Glucose:  94  Technique:  18.7 mCi F-18 FDG was injected intravenously. CT data was obtained and used for attenuation correction and anatomic localization only.  (This was not acquired as a diagnostic CT examination.) Additional exam technical data entered on technologist worksheet.  Comparison:  CT 05/28/2012  Findings:  Neck: No hypermetabolic lymph nodes in the neck.  Chest:  Left upper lobe spiculated nodule measuring 20 mm has intense metabolic activity with SUV max = 7.8.  No hypermetabolic mediastinal lymph nodes.  No additional  suspicious pulmonary nodules.  There are several ground-glass nodules in the left upper lobe and superior segment of the right lower lobe which are not hypermetabolic (not unexpectedly).  There is enlargement of the right lobe of the thyroid gland to 3.7 x 3.3 cm without significant hypermetabolic activity which likely represents benign goiter.  Abdomen/Pelvis:  No abnormal hypermetabolic active within the adrenal glands which are normal sinus.  Pneumobilia noted within the liver suggest prior sphincterotomy.  No hypermetabolic activity within the liver.  No hypermetabolic abdominal pelvic lymph nodes.  Skeleton:  No focal hypermetabolic activity to suggest skeletal metastasis.  IMPRESSION:  1.  Hypermetabolic left upper lobe nodule most consistent with primary bronchogenic carcinoma. 2.  No evidence of mediastinal metastasis or systemic disease. Staging by PET CT imaging is T1a N0 M0  3.  Ground-glass nodules as described on comparison CT.  Cannot exclude low grade adenocarcinoma. 4.  Pneumobilia presumably related to prior sphincterotomy.   Original Report Authenticated By: Genevive Bi, M.D.    Ct Biopsy  06/14/2012  *RADIOLOGY REPORT*  CT GUIDED LUNG BIOPSY  Date: 06/14/2012  Clinical History: Hypermetabolic left upper lobe pulmonary nodule. CT guided biopsy is requested to facilitate soft tissue diagnosis  Procedures Performed: 1. CT guided biopsy left upper lobe pulmonary nodule  Interventional Radiologist:  Sterling Big, MD  Sedation: Moderate (conscious) sedation was used.  Two mg Versed, 100 mcg Fentanyl were administered intravenously.  The patient's vital signs were monitored continuously by radiology nursing throughout the procedure.  Sedation Time: 24 minutes  Fluoroscopy time: 12 seconds  Contrast volume: None  PROCEDURE/FINDINGS:   Informed consent was obtained from the patient following explanation of the procedure, risks, benefits and alternatives. The patient understands, agrees and  consents for the procedure. All questions were addressed. A time out was performed.  Maximal barrier sterile technique utilized including caps, mask, sterile gowns, sterile gloves, large sterile drape, hand hygiene, and betadine skin prep.  A planning axial CT scan was performed with the patient in the supine position.  The left upper lobe pulmonary nodule was localized.  An appropriate skin entry site was selected and marked. Local anesthesia was achieved with infiltration of 1% lidocaine. Under CT fluoroscopic guidance, a 17 gauge trocar needle was advanced through the soft tissues, across the pleura and into the anterior margin of the pulmonary nodule.  Of note, the nodule is difficult to puncture and was initially displaced by the needle. The nodule was then quickly pierced to gain sufficient purchase. Two 18 gauge core needle biopsies were  then obtained.  Following this the patient began to experience a small amount of hemoptysis, therefore an axial CT scan was performed which demonstrated a moderate amount of ground-glass attenuation opacity surrounding the nodule consistent with alveolar hemorrhage.  Given the quality of the cores obtained, the decision was made to terminate the procedure at this time rather than risk worsening hemorrhage.  The needle was removed and the patient placed in the left lateral decubitus position.  Overall, the patient tolerated the procedure well.  The post procedural hemoptysis was minimal.  A two hour follow-up chest x- ray demonstrated no pneumothorax and no evidence of expanding pulmonary hemorrhage.  The patient was discharged home in stable condition at 3 hours.  IMPRESSION:  1.  Technically successful CT-guided core biopsy of hypermetabolic left upper lobe pulmonary nodule.  2.  Complicated by mild pulmonary hemorrhage and hemoptysis which was self limiting.  Signed,  Sterling Big, MD Vascular & Interventional Radiologist [facility]   Original Report  Authenticated By: Sterling Big, M.D.    Dg Chest Port 1 View  06/14/2012  *RADIOLOGY REPORT*  Clinical Data: Status post percutaneous biopsy of left upper lobe lung nodule earlier today.  PORTABLE CHEST - 1 VIEW  Comparison: Imaging during CT-guided biopsy procedure.  Findings: Small amount of regional parenchymal density representing focal hemorrhage remains surrounding the left upper lobe lung nodule.  There is no evidence of increasing hemorrhage or pneumothorax after the procedure.  No edema or pleural fluid identified.  Heart size is normal.  IMPRESSION: Some residual regional hemorrhage is suspected around the left upper lobe lung nodule without evidence of increasing hemorrhage or pneumothorax following the procedure.   Original Report Authenticated By: Reola Calkins, M.D.       IMPRESSION: Clinical stage I non-small cell lung cancer.  Given the patient's age as well as medical issues he is not felt to be a good candidate for surgical intervention. Patient in addition does not desire surgery for management of his clinical stage I non-small cell lung cancer.   he would be a good candidate for stereotactic body radiotherapy. His lesion appears to be in an ideal location for this therapy. I discussed treatment course side effects and toxicities of SBRT with the patient and his daughter. Patient appears to understand and wishes to proceed with planned course of treatment.  PLAN: Simulation and planning on November 6. Anticipate between 3 and 5 SBRT treatments.  I ------------------------------------------------   Billie Lade, PhD, MD

## 2012-06-17 NOTE — Progress Notes (Signed)
[facility] Brief Psychosocial Assessment Clinical Social Work  Clinical Social Work met with patient and patient's daughter from [place] in [place] today.  Marvin [name] states he lives alone and sometimes gets bored.  However, he is not interested in getting involved in activities because he "is a bit of a loner".  He is unable to drive but has friends that have volunteered to provide transportation.  CSW encouraged patient to contact CSW with any additional needs or questions.  Kathrin Penner, MSW, LCSW Clinical Social Worker [facility] [phone]

## 2012-06-17 NOTE — Progress Notes (Signed)
[address]            [place] [address]          [phone]       Marvin [name] [facility] Medical Record #[id number] Date of Birth: 21-Apr-1914  York Spaniel, MD [name], MD  Chief Complaint:   PostOp Follow Up Visit   History of Present Illness:      Since last seen patient had needle bx of lung lesion confirming adenocarcinoma of the lung         History  Smoking status  . Former Smoker -- 1.5 packs/day for 40 years  . Types: Cigarettes  Smokeless tobacco  . Former Neurosurgeon  . Quit date: 08/18/1970       No Known Allergies  Current Outpatient Prescriptions  Medication Sig Dispense Refill  . amLODipine (NORVASC) 2.5 MG tablet Take 2.5 mg by mouth daily.      Marland Kitchen aspirin EC 325 MG tablet Take 325 mg by mouth daily.        . Calcium Carbonate (CALTRATE 600 PO) Take by mouth 2 (two) times daily.      . hydrochlorothiazide (HYDRODIURIL) 25 MG tablet Take 12.5 mg by mouth daily.        Marland Kitchen ibuprofen (ADVIL,MOTRIN) 200 MG tablet Take 400 mg by mouth every 6 (six) hours as needed.      . Multiple Vitamin (MULTIVITAMIN WITH MINERALS) TABS Take 1 tablet by mouth daily.      . predniSONE (DELTASONE) 5 MG tablet Take 5 mg by mouth daily. Taper doses pack      . pyridostigmine (MESTINON) 60 MG tablet Take 60 mg by mouth 3 (three) times daily.      . simvastatin (ZOCOR) 40 MG tablet Take 40 mg by mouth at bedtime.        . Tamsulosin HCl (FLOMAX) 0.4 MG CAPS Take 0.4 mg by mouth at bedtime.      . Vitamin D, Ergocalciferol, (DRISDOL) 50000 UNITS CAPS Take 50,000 Units by mouth every 7 (seven) days. On Sundays           Physical Exam: BP 123/56  Pulse 83  Resp 18  Ht 5\' 7"  (1.702 m)  Wt 195 lb (88.451 kg)  BMI 30.54 kg/m2  SpO2 96%  General appearance: alert and cooperative Neurologic: intact and ptosis of eyes rt greater then left Heart: regular rate and rhythm, S1, S2 normal, no murmur, click, rub or gallop  and normal apical impulse Lungs: clear to auscultation bilaterally and normal percussion bilaterally Abdomen: soft, non-tender; bowel sounds normal; no masses,  no organomegaly Extremities: extremities normal, atraumatic, no cyanosis or edema and Homans sign is negative, no sign of DVT   Diagnostic Studies & Laboratory data:         Recent Radiology Findings:  1Ct Biopsy  06/14/2012  *RADIOLOGY REPORT*  CT GUIDED LUNG BIOPSY  Date: 06/14/2012  Clinical History: Hypermetabolic left upper lobe pulmonary nodule. CT guided biopsy is requested to facilitate soft tissue diagnosis  Procedures Performed: 1. CT guided biopsy left upper lobe pulmonary nodule  Interventional Radiologist:  Sterling Big, MD  Sedation: Moderate (conscious) sedation was used.  Two mg Versed, 100 mcg Fentanyl were administered intravenously.  The patient's vital signs were monitored continuously by radiology nursing throughout the procedure.  Sedation Time: 24 minutes  Fluoroscopy time: 12 seconds  Contrast volume: None  PROCEDURE/FINDINGS:   Informed consent was obtained from the patient following explanation of the procedure, risks, benefits and alternatives. The patient understands, agrees and consents for the procedure. All questions were addressed. A time out was performed.  Maximal barrier sterile technique utilized including caps, mask, sterile gowns, sterile gloves, large sterile drape, hand hygiene, and betadine skin prep.  A planning axial CT scan was performed with the patient in the supine position.  The left upper lobe pulmonary nodule was localized.  An appropriate skin entry site was selected and marked. Local anesthesia was achieved with infiltration of 1% lidocaine. Under CT fluoroscopic guidance, a 17 gauge trocar needle was advanced through the soft tissues, across the pleura and into the anterior margin of the pulmonary nodule.  Of note, the nodule is difficult to puncture and was initially displaced by the  needle. The nodule was then quickly pierced to gain sufficient purchase. Two 18 gauge core needle biopsies were then obtained.  Following this the patient began to experience a small amount of hemoptysis, therefore an axial CT scan was performed which demonstrated a moderate amount of ground-glass attenuation opacity surrounding the nodule consistent with alveolar hemorrhage.  Given the quality of the cores obtained, the decision was made to terminate the procedure at this time rather than risk worsening hemorrhage.  The needle was removed and the patient placed in the left lateral decubitus position.  Overall, the patient tolerated the procedure well.  The post procedural hemoptysis was minimal.  A two hour follow-up chest x- ray demonstrated no pneumothorax and no evidence of expanding pulmonary hemorrhage.  The patient was discharged home in stable condition at 3 hours.  IMPRESSION:  1.  Technically successful CT-guided core biopsy of hypermetabolic left upper lobe pulmonary nodule.  2.  Complicated by mild pulmonary hemorrhage and hemoptysis which was self limiting.  Signed,  Sterling Big, MD Vascular & Interventional Radiologist [facility]   Original Report Authenticated By: Sterling Big, M.D.    Dg Chest Port 1 View  06/14/2012  *RADIOLOGY REPORT*  Clinical Data: Status post percutaneous biopsy of left upper lobe lung nodule earlier today.  PORTABLE CHEST - 1 VIEW  Comparison: Imaging during CT-guided biopsy procedure.  Findings: Small amount of regional parenchymal density representing focal hemorrhage remains surrounding the left upper lobe lung nodule.  There is no evidence of increasing hemorrhage or pneumothorax after the procedure.  No edema or pleural fluid identified.  Heart size is normal.  IMPRESSION: Some residual regional hemorrhage is suspected around the left upper lobe lung nodule without evidence of increasing hemorrhage or pneumothorax following the procedure.    Original Report Authenticated By: Reola Calkins, M.D.      Recent Labs: Lab Results  Component Value Date   WBC 13.9* 06/14/2012   HGB 12.8* 06/14/2012   HCT 37.4* 06/14/2012   PLT 228 06/14/2012   GLUCOSE 114* 04/06/2012   NA 137 04/06/2012   K 4.0 04/06/2012   CL 102 04/06/2012   CREATININE 1.61* 04/06/2012   BUN 32* 04/06/2012   CO2 27 04/06/2012   INR 0.88 06/14/2012      Assessment / Plan:   Stage 1 adenocarcinoma of lung, I have discussed with the patient and daughter the options of treatment, he wishes to proceed with stereotactic radiotherapy and will see Dr Roselind Messier today        Sheliah Plane B 06/17/2012 8:18 PM

## 2012-06-23 ENCOUNTER — Ambulatory Visit
Admission: RE | Admit: 2012-06-23 | Discharge: 2012-06-23 | Disposition: A | Discharge status: Home / Self Care | Payer: Medicare Other | Source: Ambulatory Visit | Attending: Radiation Oncology | Admitting: Radiation Oncology

## 2012-06-23 ENCOUNTER — Telehealth: Payer: Self-pay | Admitting: Radiation Oncology

## 2012-06-23 DIAGNOSIS — C3490 Malignant neoplasm of unspecified part of unspecified bronchus or lung: Secondary | ICD-10-CM | POA: Insufficient documentation

## 2012-06-23 DIAGNOSIS — C3410 Malignant neoplasm of upper lobe, unspecified bronchus or lung: Secondary | ICD-10-CM

## 2012-06-23 DIAGNOSIS — Z510 Encounter for antineoplastic radiation therapy: Secondary | ICD-10-CM | POA: Insufficient documentation

## 2012-06-23 NOTE — Progress Notes (Signed)
[facility] Radiation Oncology Simulation and Treatment Planning Note   Name:  @PATNAME @ MRN: [id number]   Date: 06/23/2012  DOB: 1913-11-10  Status:outpatient    DIAGNOSIS: Stage I non-small cell lung   CONSENT VERIFIED:yes   SET UP: Patient is setup supine   IMMOBILIZATION: The patient was immobilized using a Vac Loc bag and Abdominal Compression.   NARRATIVE:The patient was brought to the CT Simulation planning suite.  Identity was confirmed.  All relevant records and images related to the planned course of therapy were reviewed.  Then, the patient was positioned in a stable reproducible clinical set-up for radiation therapy. Abdominal compression was applied by me.  4D CT images were obtained and reproducible breathing pattern was confirmed. Free breathing CT images were obtained.  Skin markings were placed.  The CT images were loaded into the planning software where the target and avoidance structures were contoured.  The radiation prescription was entered and confirmed.    TREATMENT PLANNING NOTE:  Treatment planning then occurred. I have requested : MLC's, isodose plan, basic dose calculation. Dose will be 54 gray and 3 fractions.  3 dimensional simulation is performed and dose volume histogram of the gross tumor volume, planning tumor volume and criticial normal structures including the spinal cord and lungs were analyzed and requested.  Special treatment procedure was performed due to high dose per fraction.  The patient will be monitored for increased risk of toxicity.  Daily imaging using cone beam CT will be used for target localization.  _____________________________    Billie Lade, PhD, MD

## 2012-06-23 NOTE — Telephone Encounter (Signed)
Met with patient to discuss RO billing. Pt has financial concerns and was given an EPP and MCD application to complete and return.  Dx:  162.9 Malignant neoplasm of bronchus and lung unspecified.  Attending Rad: Dr. Marian Sorrow Tx: [facility] Robot [phone]

## 2012-07-05 ENCOUNTER — Ambulatory Visit: Payer: Medicare Other | Admitting: Radiation Oncology

## 2012-07-06 ENCOUNTER — Ambulatory Visit
Admission: RE | Admit: 2012-07-06 | Discharge: 2012-07-06 | Disposition: A | Discharge status: Home / Self Care | Payer: Medicare Other | Source: Ambulatory Visit | Attending: Radiation Oncology | Admitting: Radiation Oncology

## 2012-07-06 ENCOUNTER — Ambulatory Visit: Payer: Medicare Other

## 2012-07-06 VITALS — BP 179/67 | HR 76 | Temp 97.7°F | Wt 184.8 lb

## 2012-07-06 DIAGNOSIS — C3410 Malignant neoplasm of upper lobe, unspecified bronchus or lung: Secondary | ICD-10-CM

## 2012-07-06 NOTE — Progress Notes (Signed)
  Radiation Oncology         (336) [phone] ________________________________  Name: PEIGHTON EDGIN MRN: [id number]  Date: 07/06/2012  DOB: 1914-04-23  Stereotactic Body Radiotherapy Treatment Procedure Note  NARRATIVE:  EZARIAH NACE was brought to the stereotactic radiation treatment machine and placed supine on the CT couch. The patient was set up for stereotactic body radiotherapy on the body fix pillow.  3D TREATMENT PLANNING AND DOSIMETRY:  The patient's radiation plan was reviewed and approved prior to starting treatment.  It showed 3-dimensional radiation distributions overlaid onto the planning CT.  The [facility] for the target structures as well as the organs at risk were reviewed. The documentation of this is filed in the radiation oncology EMR.  SIMULATION VERIFICATION:  The patient underwent CT imaging on the treatment unit.  These were carefully aligned to document that the ablative radiation dose would cover the target volume and maximally spare the nearby organs at risk according to the planned distribution.  SPECIAL TREATMENT PROCEDURE: Li A Tierno received high dose ablative stereotactic body radiotherapy to the planned target volume without unforeseen complications. Treatment was delivered uneventfully. The high doses associated with stereotactic body radiotherapy and the significant potential risks require careful treatment set up and patient monitoring constituting a special treatment procedure   STEREOTACTIC TREATMENT MANAGEMENT:  Following delivery, the patient was evaluated clinically. The patient tolerated treatment without significant acute effects, and was discharged to home in stable condition.    PLAN: Continue treatment as planned.  ________________________________  Billie Lade, PhD, MD

## 2012-07-06 NOTE — Progress Notes (Signed)
Patient here for 1 of 3 SBRT of left lung.Denies pain, shortness of breath or cough.

## 2012-07-06 NOTE — Progress Notes (Signed)
[facility]    Radiation Oncology [address] [place]     Maryln Gottron, M.D. [place], [place] [address]               Billie Lade, M.D., Ph.D. Phone: [phone]      Molli Hazard A. Kathrynn Running, M.D. Fax: [phone]      Radene Gunning, M.D., Ph.D.         Lurline Hare, M.D.         Grayland Jack, M.D Weekly Treatment Management Note  Name: Marvin [name]     MRN: [id number]        CSN: [id number] Date: 07/06/2012      DOB: [birth year]  CC: Marvin Headings, MD         Marvin [name]    Status: Outpatient  Diagnosis: The encounter diagnosis was Lung cancer, upper lobe.  Current Dose: 18 Gy   Current Fraction: 1  Planned Dose: 54.0 Gy  Narrative: Marvin [name] was seen today for weekly treatment management. The chart was checked and CBCT  were reviewed. He received his first SBRT treatment earlier today and tolerated this well. He denies any cough or breathing problems.  Review of patient's allergies indicates no known allergies. Current Outpatient Prescriptions  Medication Sig Dispense Refill  . amLODipine (NORVASC) 2.5 MG tablet Take 2.5 mg by mouth daily.      Marland Kitchen aspirin EC 325 MG tablet Take 325 mg by mouth daily.        . Calcium Carbonate (CALTRATE 600 PO) Take by mouth 2 (two) times daily.      . hydrochlorothiazide (HYDRODIURIL) 25 MG tablet Take 12.5 mg by mouth daily.        Marland Kitchen ibuprofen (ADVIL,MOTRIN) 200 MG tablet Take 400 mg by mouth every 6 (six) hours as needed.      . Multiple Vitamin (MULTIVITAMIN WITH MINERALS) TABS Take 1 tablet by mouth daily.      . predniSONE (DELTASONE) 5 MG tablet Take 5 mg by mouth daily. Taper doses pack      . pyridostigmine (MESTINON) 60 MG tablet Take 60 mg by mouth 3 (three) times daily.      . simvastatin (ZOCOR) 40 MG tablet Take 40 mg by mouth at bedtime.        . Tamsulosin HCl (FLOMAX) 0.4 MG CAPS Take 0.4 mg by mouth at bedtime.      . Vitamin D, Ergocalciferol, (DRISDOL) 50000 UNITS CAPS Take 50,000  Units by mouth every 7 (seven) days. On Sundays       Labs:  Lab Results  Component Value Date   WBC 13.9* 06/14/2012   HGB 12.8* 06/14/2012   HCT 37.4* 06/14/2012   MCV 85.4 06/14/2012   PLT 228 06/14/2012   Lab Results  Component Value Date   CREATININE 1.61* 04/06/2012   BUN 32* 04/06/2012   NA 137 04/06/2012   K 4.0 04/06/2012   CL 102 04/06/2012   CO2 27 04/06/2012   No results found for this basename: ALT, AST, GGT, ALK, PHOS, BILITOT    Physical Examination:  weight is 184 lb 12.8 oz (83.825 kg). His temperature is 97.7 F (36.5 C). His blood pressure is 179/67 and his pulse is 76.    Wt Readings from Last 3 Encounters:  07/06/12 184 lb 12.8 oz (83.825 kg)  06/17/12 195 lb (88.451 kg)  06/17/12 195 lb (88.451 kg)     Lungs - Normal respiratory effort, chest expands symmetrically. Lungs are  clear to auscultation, no crackles or wheezes.  Heart has regular rhythm and rate  Abdomen is soft and non tender with normal bowel sounds  Assessment:  Patient tolerating treatments well  Plan: Continue treatment per original radiation prescription

## 2012-07-07 ENCOUNTER — Ambulatory Visit: Payer: Medicare Other | Admitting: Radiation Oncology

## 2012-07-08 ENCOUNTER — Ambulatory Visit
Admission: RE | Admit: 2012-07-08 | Discharge: 2012-07-08 | Disposition: A | Discharge status: Home / Self Care | Payer: Medicare Other | Source: Ambulatory Visit | Attending: Radiation Oncology | Admitting: Radiation Oncology

## 2012-07-08 ENCOUNTER — Ambulatory Visit: Payer: Medicare Other

## 2012-07-08 DIAGNOSIS — C3410 Malignant neoplasm of upper lobe, unspecified bronchus or lung: Secondary | ICD-10-CM

## 2012-07-08 NOTE — Progress Notes (Signed)
  Radiation Oncology         [phone] ________________________________  Name: Marvin [name] MRN: [id number]  Date: 07/08/2012  DOB: 1914-07-05  Stereotactic Body Radiotherapy Treatment Procedure Note  NARRATIVE:  Marvin [name] was brought to the stereotactic radiation treatment machine and placed supine on the CT couch. The patient was set up for stereotactic body radiotherapy on the body fix pillow.  3D TREATMENT PLANNING AND DOSIMETRY:  The patient's radiation plan was reviewed and approved prior to starting treatment.  It showed 3-dimensional radiation distributions overlaid onto the planning CT.  The [facility] for the target structures as well as the organs at risk were reviewed. The documentation of this is filed in the radiation oncology EMR.  SIMULATION VERIFICATION:  The patient underwent CT imaging on the treatment unit.  These were carefully aligned to document that the ablative radiation dose would cover the target volume and maximally spare the nearby organs at risk according to the planned distribution.  SPECIAL TREATMENT PROCEDURE: Marvin [name] received high dose ablative stereotactic body radiotherapy to the planned target volume without unforeseen complications. Treatment was delivered uneventfully. The high doses associated with stereotactic body radiotherapy and the significant potential risks require careful treatment set up and patient monitoring constituting a special treatment procedure   STEREOTACTIC TREATMENT MANAGEMENT:  Following delivery, the patient was evaluated clinically. The patient tolerated treatment without significant acute effects, and was discharged to home in stable condition.    PLAN: Continue treatment as planned.  ________________________________  Billie Lade, PhD, MD

## 2012-07-09 ENCOUNTER — Ambulatory Visit: Payer: Medicare Other | Admitting: Radiation Oncology

## 2012-07-12 ENCOUNTER — Ambulatory Visit: Payer: Medicare Other | Admitting: Radiation Oncology

## 2012-07-13 ENCOUNTER — Ambulatory Visit
Admission: RE | Admit: 2012-07-13 | Discharge: 2012-07-13 | Disposition: A | Discharge status: Home / Self Care | Payer: Medicare Other | Source: Ambulatory Visit | Attending: Radiation Oncology | Admitting: Radiation Oncology

## 2012-07-13 DIAGNOSIS — C3410 Malignant neoplasm of upper lobe, unspecified bronchus or lung: Secondary | ICD-10-CM

## 2012-07-13 NOTE — Progress Notes (Signed)
  Radiation Oncology         [phone] ________________________________  Name: Marvin [name] MRN: [id number]  Date: 07/13/2012  DOB: 08/27/1913  Stereotactic Body Radiotherapy Treatment Procedure Note  NARRATIVE:  Marvin [name] was brought to the stereotactic radiation treatment machine and placed supine on the CT couch. The patient was set up for stereotactic body radiotherapy on the body fix pillow.  3D TREATMENT PLANNING AND DOSIMETRY:  The patient's radiation plan was reviewed and approved prior to starting treatment.  It showed 3-dimensional radiation distributions overlaid onto the planning CT.  The [facility] for the target structures as well as the organs at risk were reviewed. The documentation of this is filed in the radiation oncology EMR.  SIMULATION VERIFICATION:  The patient underwent CT imaging on the treatment unit.  These were carefully aligned to document that the ablative radiation dose would cover the target volume and maximally spare the nearby organs at risk according to the planned distribution.  SPECIAL TREATMENT PROCEDURE: Marvin [name] received high dose ablative stereotactic body radiotherapy to the planned target volume without unforeseen complications. Treatment was delivered uneventfully. The high doses associated with stereotactic body radiotherapy and the significant potential risks require careful treatment set up and patient monitoring constituting a special treatment procedure   STEREOTACTIC TREATMENT MANAGEMENT:  Following delivery, the patient was evaluated clinically. The patient tolerated treatment without significant acute effects, and was discharged to home in stable condition.    PLAN: Continue treatment as planned.  ________________________________  Billie Lade, PhD, MD

## 2012-07-13 NOTE — Progress Notes (Signed)
  Radiation Oncology         [phone] ________________________________  Name: Marvin [name] MRN: [id number]  Date: 07/13/2012  DOB: April 06, 1914  End of Treatment Note  Diagnosis:   Clinical stage I non-small cell lung cancer     Indication for treatment:  Definitive       Radiation treatment dates:   November 19 November 21 and 07/13/2012  Site/dose:   Left upper lung nodule 54 gray in 3 fractions  Beams/energy:   Patient was treated with SBRT techniques, the patient was treated with 6 MV photons  Narrative: The patient tolerated radiation treatment relatively well.   He did not experience any fatigue, cough or breathing problems or pain within the chest.  Plan: The patient has completed radiation treatment. The patient will return to radiation oncology clinic for routine followup in one month. I advised them to call or return sooner if they have any questions or concerns related to their recovery or treatment.  -----------------------------------  Billie Lade, PhD, MD

## 2012-07-14 ENCOUNTER — Ambulatory Visit: Payer: Medicare Other | Admitting: Radiation Oncology

## 2012-08-10 ENCOUNTER — Encounter: Payer: Self-pay | Admitting: Radiation Oncology

## 2012-08-10 DIAGNOSIS — Z923 Personal history of irradiation: Secondary | ICD-10-CM | POA: Insufficient documentation

## 2012-08-10 DIAGNOSIS — C3490 Malignant neoplasm of unspecified part of unspecified bronchus or lung: Secondary | ICD-10-CM | POA: Insufficient documentation

## 2012-08-12 ENCOUNTER — Ambulatory Visit
Admission: RE | Admit: 2012-08-12 | Discharge: 2012-08-12 | Disposition: A | Discharge status: Home / Self Care | Payer: Medicare Other | Source: Ambulatory Visit | Attending: Radiation Oncology | Admitting: Radiation Oncology

## 2012-08-12 ENCOUNTER — Encounter: Payer: Self-pay | Admitting: Radiation Oncology

## 2012-08-12 VITALS — BP 186/68 | HR 88 | Temp 97.6°F | Resp 20

## 2012-08-12 DIAGNOSIS — C3410 Malignant neoplasm of upper lobe, unspecified bronchus or lung: Secondary | ICD-10-CM

## 2012-08-12 NOTE — Progress Notes (Signed)
Patient here for follow up lung rad txs on 07/06/12,07/08/12, 07/13/12 No coughing, no nausea, no difficulty swallowing, no pain, 100% room air sats, states occasionally both arms feels"like no blood supply dead, " he does exercises till it resolves, and leg cramps on his shins most nights

## 2012-08-12 NOTE — Progress Notes (Signed)
  Radiation Oncology         ([phone]) [phone] ________________________________  Name: Marvin [name] MRN: [id number]  Date: 08/12/2012  DOB: 10-17-1913  Follow-Up Visit Note  CC: [name], MD  Delight Ovens, MD  Diagnosis:   Clinical stage I non-small cell lung cancer, the patient underwent SBRT totaling 54 Gy  Interval Since Last Radiation:  4 weeks  Narrative:  The patient returns today for routine follow-up.  He is doing well at this time. He noticed some slight fatigue after his treatments but no other issues. He denies any cough, hemoptysis or breathing problems. He denies any pain in the chest area.  His appetite is good.                              ALLERGIES:   has no known allergies.  Meds: Current Outpatient Prescriptions  Medication Sig Dispense Refill  . amLODipine (NORVASC) 2.5 MG tablet Take 2.5 mg by mouth daily.      Marland Kitchen aspirin EC 325 MG tablet Take 325 mg by mouth daily.        . Calcium Carbonate (CALTRATE 600 PO) Take by mouth 2 (two) times daily.      . hydrochlorothiazide (HYDRODIURIL) 25 MG tablet Take 12.5 mg by mouth daily.        Marland Kitchen ibuprofen (ADVIL,MOTRIN) 200 MG tablet Take 400 mg by mouth every 6 (six) hours as needed.      . Multiple Vitamin (MULTIVITAMIN WITH MINERALS) TABS Take 1 tablet by mouth daily.      . predniSONE (DELTASONE) 5 MG tablet Take 5 mg by mouth daily. Taper doses pack      . pyridostigmine (MESTINON) 60 MG tablet Take 60 mg by mouth 3 (three) times daily.      . simvastatin (ZOCOR) 40 MG tablet Take 40 mg by mouth at bedtime.        . Tamsulosin HCl (FLOMAX) 0.4 MG CAPS Take 0.4 mg by mouth at bedtime.      . Vitamin D, Ergocalciferol, (DRISDOL) 50000 UNITS CAPS Take 50,000 Units by mouth every 7 (seven) days. On Sundays        Physical Findings: The patient is in no acute distress. Patient is alert and oriented.  oral temperature is 97.6 F (36.4 C). His blood pressure is 186/68 and his pulse is 88. His respiration is 20  and oxygen saturation is 100%. .  No palpable supraclavicular adenopathy.  The lungs are clear to auscultation. The heart has a regular rhythm and rate.  The abdomen is soft and nontender with normal bowel sounds.    Impression:  The patient is recovering from the effects of radiation.  He is doing well clinically at this time  Plan:  Routine followup in 2 months. At that time the patient will be scheduled for CT scan of the chest to assess his response to therapy.  _____________________________________  -----------------------------------  Billie Lade, PhD, MD

## 2012-09-14 ENCOUNTER — Ambulatory Visit
Admission: RE | Admit: 2012-09-14 | Discharge: 2012-09-14 | Disposition: A | Discharge status: Home / Self Care | Payer: Medicare Other | Source: Ambulatory Visit | Attending: Internal Medicine | Admitting: Internal Medicine

## 2012-09-14 ENCOUNTER — Other Ambulatory Visit: Payer: Self-pay | Admitting: Internal Medicine

## 2012-09-14 DIAGNOSIS — M79604 Pain in right leg: Secondary | ICD-10-CM

## 2012-09-14 DIAGNOSIS — R609 Edema, unspecified: Secondary | ICD-10-CM

## 2012-10-02 ENCOUNTER — Other Ambulatory Visit: Payer: Self-pay

## 2012-10-18 ENCOUNTER — Ambulatory Visit
Admission: RE | Admit: 2012-10-18 | Discharge: 2012-10-18 | Disposition: A | Discharge status: Home / Self Care | Payer: Medicare Other | Source: Ambulatory Visit | Attending: Radiation Oncology | Admitting: Radiation Oncology

## 2012-10-18 ENCOUNTER — Encounter: Payer: Self-pay | Admitting: Radiation Oncology

## 2012-10-18 ENCOUNTER — Telehealth: Payer: Self-pay | Admitting: *Deleted

## 2012-10-18 NOTE — Progress Notes (Addendum)
Patient presents to the clinic today accompanied by a friend for follow up with Dr. Roselind Messier. Patient alert and oriented to person, place, and time. No distress noted. Slow steady gait noted with assistance of cane. Pleasant affect noted. Patient denies pain at this time. Patient HOH. Patient denies shortness of breath. Patient denies cough. Patient denies painful or difficulty swallowing. Patient reports sleeping without difficulty. Patient reports a great appetite. Patient denies hemoptysis or breathing problems. Patient denies pain in chest. Patient is concerned about scant blood from his nasal passages when he blows his nose. Encouraged patient to use vaseline just inside nostrils before going to bed and try a humidifier at night by the bed. Patient verbalized understanding. Reported all findings to Dr. Roselind Messier.

## 2012-10-18 NOTE — Progress Notes (Signed)
  Radiation Oncology         ([phone]) [phone] ________________________________  Name: Marvin [name] MRN: [id number]  Date: 10/18/2012  DOB: [birth year]  Follow-Up Visit Note  CC: [name], MD  Delight Ovens, MD  Diagnosis:   Clinical stage I non-small cell lung cancer  Interval Since Last Radiation:  3  months , the patient underwent SBRT  Narrative:  The patient returns today for routine follow-up.  He is doing well and without complaints. He has had some problems with swelling in his ankle and feet in was placed on Lasix for this issue. The patient did undergo a Doppler of his right lower extremity to rule out DVT on January 28. The study was negative.  He denies any pain in the chest area cough or hemoptysis. Patient denies any dyspnea.                              ALLERGIES:  has No Known Allergies.  Meds: Current Outpatient Prescriptions  Medication Sig Dispense Refill  . amLODipine (NORVASC) 2.5 MG tablet Take 2.5 mg by mouth daily.      Marland Kitchen aspirin EC 325 MG tablet Take 325 mg by mouth daily.        . Calcium Carbonate (CALTRATE 600 PO) Take by mouth 2 (two) times daily.      . hydrochlorothiazide (HYDRODIURIL) 25 MG tablet Take 12.5 mg by mouth daily.        Marland Kitchen ibuprofen (ADVIL,MOTRIN) 200 MG tablet Take 400 mg by mouth every 6 (six) hours as needed.      . Multiple Vitamin (MULTIVITAMIN WITH MINERALS) TABS Take 1 tablet by mouth daily.      . predniSONE (DELTASONE) 5 MG tablet Take 5 mg by mouth daily. Taper doses pack      . pyridostigmine (MESTINON) 60 MG tablet Take 60 mg by mouth 3 (three) times daily.      . simvastatin (ZOCOR) 40 MG tablet Take 40 mg by mouth at bedtime.        . Tamsulosin HCl (FLOMAX) 0.4 MG CAPS Take 0.4 mg by mouth at bedtime.      . Vitamin D, Ergocalciferol, (DRISDOL) 50000 UNITS CAPS Take 50,000 Units by mouth every 7 (seven) days. On Sundays       No current facility-administered medications for this encounter.    Physical  Findings: The patient is in no acute distress. Patient is alert and oriented.  weight is 191 lb 11.2 oz (86.955 kg). His oral temperature is 97.4 F (36.3 C). His blood pressure is 164/70 and his pulse is 99. His respiration is 18 and oxygen saturation is 98%. .  No palpable supraclavicular or axillary adenopathy. The lungs are clear to auscultation. The heart has a regular rhythm and rate. The abdomen is soft and nontender with normal bowel sounds. Examination of extremities reveals some pitting edema in the ankle and foot areas.    Radiographic Findings: No results found.  Impression:  The patient is recovering from the effects of radiation.  No evidence of recurrence on clinical exam today.  Plan:  The patient will be scheduled for CT scan of the chest to assess his response to therapy. He will return for routine followup in 6 months.  _____________________________________  -----------------------------------  Billie Lade, PhD, MD

## 2012-10-18 NOTE — Telephone Encounter (Signed)
CALLED PATIENT TO INFORM OF TEST, SPOKE WITH PATIENT AND HE IS AWARE OF THIS TEST. 

## 2012-11-09 ENCOUNTER — Encounter (HOSPITAL_COMMUNITY): Payer: Self-pay

## 2012-11-09 ENCOUNTER — Ambulatory Visit (HOSPITAL_COMMUNITY)
Admission: RE | Admit: 2012-11-09 | Discharge: 2012-11-09 | Disposition: A | Discharge status: Home / Self Care | Payer: Medicare Other | Source: Ambulatory Visit | Attending: Radiation Oncology | Admitting: Radiation Oncology

## 2012-11-09 DIAGNOSIS — N289 Disorder of kidney and ureter, unspecified: Secondary | ICD-10-CM | POA: Insufficient documentation

## 2012-11-09 DIAGNOSIS — C3490 Malignant neoplasm of unspecified part of unspecified bronchus or lung: Secondary | ICD-10-CM | POA: Insufficient documentation

## 2012-11-09 DIAGNOSIS — E049 Nontoxic goiter, unspecified: Secondary | ICD-10-CM | POA: Insufficient documentation

## 2012-11-09 DIAGNOSIS — I2510 Atherosclerotic heart disease of native coronary artery without angina pectoris: Secondary | ICD-10-CM | POA: Insufficient documentation

## 2012-12-02 ENCOUNTER — Inpatient Hospital Stay (HOSPITAL_COMMUNITY)
Admission: EM | Admit: 2012-12-02 | Discharge: 2012-12-05 | DRG: 378 | Disposition: A | Discharge status: Home Health | Payer: Medicare Other | Attending: Internal Medicine | Admitting: Internal Medicine

## 2012-12-02 ENCOUNTER — Encounter (HOSPITAL_COMMUNITY): Payer: Self-pay | Admitting: *Deleted

## 2012-12-02 DIAGNOSIS — R7309 Other abnormal glucose: Secondary | ICD-10-CM | POA: Diagnosis present

## 2012-12-02 DIAGNOSIS — K922 Gastrointestinal hemorrhage, unspecified: Secondary | ICD-10-CM

## 2012-12-02 DIAGNOSIS — R911 Solitary pulmonary nodule: Secondary | ICD-10-CM

## 2012-12-02 DIAGNOSIS — C3490 Malignant neoplasm of unspecified part of unspecified bronchus or lung: Secondary | ICD-10-CM | POA: Diagnosis present

## 2012-12-02 DIAGNOSIS — N269 Renal sclerosis, unspecified: Secondary | ICD-10-CM | POA: Diagnosis present

## 2012-12-02 DIAGNOSIS — G7000 Myasthenia gravis without (acute) exacerbation: Secondary | ICD-10-CM

## 2012-12-02 DIAGNOSIS — I959 Hypotension, unspecified: Secondary | ICD-10-CM | POA: Diagnosis present

## 2012-12-02 DIAGNOSIS — T380X5A Adverse effect of glucocorticoids and synthetic analogues, initial encounter: Secondary | ICD-10-CM | POA: Diagnosis present

## 2012-12-02 DIAGNOSIS — K625 Hemorrhage of anus and rectum: Secondary | ICD-10-CM

## 2012-12-02 DIAGNOSIS — N183 Chronic kidney disease, stage 3 unspecified: Secondary | ICD-10-CM | POA: Diagnosis present

## 2012-12-02 DIAGNOSIS — I10 Essential (primary) hypertension: Secondary | ICD-10-CM

## 2012-12-02 DIAGNOSIS — N400 Enlarged prostate without lower urinary tract symptoms: Secondary | ICD-10-CM

## 2012-12-02 DIAGNOSIS — Z923 Personal history of irradiation: Secondary | ICD-10-CM

## 2012-12-02 DIAGNOSIS — I739 Peripheral vascular disease, unspecified: Secondary | ICD-10-CM

## 2012-12-02 DIAGNOSIS — E785 Hyperlipidemia, unspecified: Secondary | ICD-10-CM

## 2012-12-02 DIAGNOSIS — D72829 Elevated white blood cell count, unspecified: Secondary | ICD-10-CM | POA: Diagnosis present

## 2012-12-02 DIAGNOSIS — N189 Chronic kidney disease, unspecified: Secondary | ICD-10-CM

## 2012-12-02 DIAGNOSIS — K5731 Diverticulosis of large intestine without perforation or abscess with bleeding: Principal | ICD-10-CM | POA: Diagnosis present

## 2012-12-02 DIAGNOSIS — I6529 Occlusion and stenosis of unspecified carotid artery: Secondary | ICD-10-CM

## 2012-12-02 DIAGNOSIS — I129 Hypertensive chronic kidney disease with stage 1 through stage 4 chronic kidney disease, or unspecified chronic kidney disease: Secondary | ICD-10-CM | POA: Diagnosis present

## 2012-12-02 DIAGNOSIS — Z79899 Other long term (current) drug therapy: Secondary | ICD-10-CM

## 2012-12-02 LAB — BASIC METABOLIC PANEL
Calcium: 8.9 mg/dL (ref 8.4–10.5)
GFR calc Af Amer: 40 mL/min — ABNORMAL LOW (ref >90)
GFR calc non Af Amer: 35 mL/min — ABNORMAL LOW (ref >90)
Glucose, Bld: 224 mg/dL — ABNORMAL HIGH (ref 70–99)
Sodium: 139 mEq/L (ref 135–145)

## 2012-12-02 LAB — PROTIME-INR: INR: 0.91 (ref 0.00–1.49)

## 2012-12-02 LAB — CBC
Hemoglobin: 10.5 g/dL — ABNORMAL LOW (ref 13.0–17.0)
MCH: 31.4 pg (ref 26.0–34.0)
MCHC: 34 g/dL (ref 30.0–36.0)
RDW: 14.8 % (ref 11.5–15.5)

## 2012-12-02 NOTE — ED Provider Notes (Signed)
History     CSN: [id number]  Arrival date & time 12/02/12  2217   First MD Initiated Contact with Patient 12/02/12 2302      Chief Complaint  Patient presents with  . Rectal Bleeding     The history is provided by the patient.   Patient reports development of new rectal bleeding in the past several hours.  8:30 PM he fell again have a bowel movement and prior blood came out of his rectum.  He says 3-4 episodes like this at home and had one episode in the emergency department.  No history of diverticulitis.  The patient denies abdominal pain at this time.  He was in his normal state of health earlier today.  He takes a 325 mg aspirin but is otherwise not on any other anticoagulants.  No lightheadedness.  No nausea or vomiting.  No fevers or chills.  No other complaints.  Symptoms are mild to moderate in severity.  No prior history of significant lower GI bleeds.  No prior history of diverticulitis.  Patient reports he had sigmoidoscopy many years ago and does not want another one   Past Medical History  Diagnosis Date  . Hyperlipidemia   . Hypertension   . Dizziness   . Blood clot in vein   . Arthritis   . Peripheral arterial disease   . Carotid artery occlusion   . Ocular myasthenia gravis   . BPH (benign prostatic hyperplasia)   . Nephrolithiasis   . Low back pain   . Gait disorder   . Lung cancer 05/2012    left  . Hx of radiation therapy 07/06/12, 07/08/12, 07/13/12    left upper lung nodule, 3 fractions    Past Surgical History  Procedure Laterality Date  . Appendectomy    . Cataract extraction      bilateral  . Retinal detachment surgery    . Thyroidectomy, partial      Family History  Problem Relation Age of Onset  . Cancer Mother   . Pneumonia Father   . Stroke Sister     History  Substance Use Topics  . Smoking status: Former Smoker -- 1.50 packs/day for 40 years    Types: Cigarettes  . Smokeless tobacco: Former Neurosurgeon    Quit date: 08/18/1970  .  Alcohol Use: No      Review of Systems  All other systems reviewed and are negative.    Allergies  Review of patient's allergies indicates no known allergies.  Home Medications   Current Outpatient Rx  Name  Route  Sig  Dispense  Refill  . amLODipine (NORVASC) 2.5 MG tablet   Oral   Take 2.5 mg by mouth daily.         Marland Kitchen aspirin EC 325 MG tablet   Oral   Take 325 mg by mouth daily.           . Calcium Carbonate (CALTRATE 600 PO)   Oral   Take by mouth 2 (two) times daily.         . hydrochlorothiazide (HYDRODIURIL) 25 MG tablet   Oral   Take 12.5 mg by mouth daily.           Marland Kitchen ibuprofen (ADVIL,MOTRIN) 200 MG tablet   Oral   Take 400 mg by mouth every 6 (six) hours as needed.         . Multiple Vitamin (MULTIVITAMIN WITH MINERALS) TABS   Oral   Take 1 tablet by mouth  daily.         . predniSONE (DELTASONE) 5 MG tablet   Oral   Take 5 mg by mouth daily. Taper doses pack         . pyridostigmine (MESTINON) 60 MG tablet   Oral   Take 60 mg by mouth 3 (three) times daily.         . simvastatin (ZOCOR) 40 MG tablet   Oral   Take 40 mg by mouth at bedtime.           . Tamsulosin HCl (FLOMAX) 0.4 MG CAPS   Oral   Take 0.4 mg by mouth at bedtime.         . Vitamin D, Ergocalciferol, (DRISDOL) 50000 UNITS CAPS   Oral   Take 50,000 Units by mouth every 7 (seven) days. On Sundays           BP 93/68  Pulse 99  Temp(Src) 97.7 F (36.5 C) (Oral)  Resp 18  SpO2 95%  Physical Exam  Nursing note and vitals reviewed. Constitutional: He is oriented to person, place, and time. He appears well-developed and well-nourished.  HENT:  Head: Normocephalic and atraumatic.  Eyes: EOM are normal.  Neck: Normal range of motion.  Cardiovascular: Normal rate, regular rhythm, normal heart sounds and intact distal pulses.   Pulmonary/Chest: Effort normal and breath sounds normal. No respiratory distress.  Abdominal: Soft. He exhibits no distension.  There is no tenderness.  Musculoskeletal: Normal range of motion.  Neurological: He is alert and oriented to person, place, and time.  Skin: Skin is warm and dry.  Psychiatric: He has a normal mood and affect. Judgment normal.    ED Course  Procedures (including critical care time)  Labs Reviewed  CBC - Abnormal; Notable for the following:    WBC 15.0 (*)    RBC 3.34 (*)    Hemoglobin 10.5 (*)    HCT 30.9 (*)    All other components within normal limits  BASIC METABOLIC PANEL - Abnormal; Notable for the following:    Glucose, Bld 224 (*)    BUN 44 (*)    Creatinine, Ser 1.60 (*)    GFR calc non Af Amer 35 (*)    GFR calc Af Amer 40 (*)    All other components within normal limits  PROTIME-INR  TYPE AND SCREEN  ABO/RH   No results found.   1. Lower GI bleed       MDM  Obvious lower GI bleeding.  The patient's had several bloody bowel movements since 8:30 PM.  No abdominal tenderness at this time.  No indication for imaging.  His only anticoagulant is aspirin.  The patient will need to be admitted the hospital for ongoing observation and serial CBCs.  He's had one bloody bowel movement in the emergency department.  Filed Vitals:   12/03/12 0001  BP: 131/54  Pulse: 103  Temp:   Resp: 15         Lyanne Co, MD 12/03/12 0030

## 2012-12-02 NOTE — ED Notes (Signed)
Pt states he has been having rectal bleeding for the past 2 hours. Pt states he went to [facility] and noted that stool had dark red blood noted x 2. Pt states this has not occurred prior to today. Pt states that he has been having constipation for the last 2-3 months. Pt states that his MD has placed him on several meds for this and when he does have BM will have some blood noted but not like this recent incident.

## 2012-12-03 ENCOUNTER — Encounter (HOSPITAL_COMMUNITY): Payer: Self-pay | Admitting: Internal Medicine

## 2012-12-03 DIAGNOSIS — K922 Gastrointestinal hemorrhage, unspecified: Secondary | ICD-10-CM

## 2012-12-03 DIAGNOSIS — G7000 Myasthenia gravis without (acute) exacerbation: Secondary | ICD-10-CM

## 2012-12-03 DIAGNOSIS — N400 Enlarged prostate without lower urinary tract symptoms: Secondary | ICD-10-CM | POA: Diagnosis present

## 2012-12-03 DIAGNOSIS — I10 Essential (primary) hypertension: Secondary | ICD-10-CM

## 2012-12-03 DIAGNOSIS — K625 Hemorrhage of anus and rectum: Secondary | ICD-10-CM | POA: Diagnosis present

## 2012-12-03 DIAGNOSIS — N189 Chronic kidney disease, unspecified: Secondary | ICD-10-CM | POA: Diagnosis present

## 2012-12-03 DIAGNOSIS — E785 Hyperlipidemia, unspecified: Secondary | ICD-10-CM | POA: Diagnosis present

## 2012-12-03 LAB — CBC
HCT: 24.6 % — ABNORMAL LOW (ref 39.0–52.0)
HCT: 24.1 % — ABNORMAL LOW (ref 39.0–52.0)
Hemoglobin: 9.6 g/dL — ABNORMAL LOW (ref 13.0–17.0)
Hemoglobin: 8.4 g/dL — ABNORMAL LOW (ref 13.0–17.0)
Hemoglobin: 8 g/dL — ABNORMAL LOW (ref 13.0–17.0)
MCH: 31.7 pg (ref 26.0–34.0)
MCH: 30.9 pg (ref 26.0–34.0)
Platelets: 181 10*3/uL (ref 150–400)
RBC: 3.03 MIL/uL — ABNORMAL LOW (ref 4.22–5.81)
RBC: 2.67 MIL/uL — ABNORMAL LOW (ref 4.22–5.81)
RBC: 2.59 MIL/uL — ABNORMAL LOW (ref 4.22–5.81)
RDW: 14.9 % (ref 11.5–15.5)
WBC: 12.2 10*3/uL — ABNORMAL HIGH (ref 4.0–10.5)
WBC: 9.2 10*3/uL (ref 4.0–10.5)

## 2012-12-03 LAB — BASIC METABOLIC PANEL
BUN: 46 mg/dL — ABNORMAL HIGH (ref 6–23)
CO2: 27 mEq/L (ref 19–32)
Calcium: 9 mg/dL (ref 8.4–10.5)
Glucose, Bld: 214 mg/dL — ABNORMAL HIGH (ref 70–99)
Sodium: 136 mEq/L (ref 135–145)

## 2012-12-03 LAB — GLUCOSE, CAPILLARY

## 2012-12-03 LAB — MRSA PCR SCREENING: MRSA by PCR: NEGATIVE

## 2012-12-03 LAB — HEMOGLOBIN A1C: Mean Plasma Glucose: 140 mg/dL — ABNORMAL HIGH (ref <117)

## 2012-12-03 MED ORDER — ACETAMINOPHEN 650 MG RE SUPP: 650.0000 mg | Freq: Four times a day (QID) | RECTAL | Status: DC | PRN

## 2012-12-03 MED ORDER — TAMSULOSIN HCL 0.4 MG PO CAPS
0.4000 mg | ORAL_CAPSULE | Freq: Every day | ORAL | Status: DC
  Administered 2012-12-03 – 2012-12-04 (×2): 0.4 mg via ORAL
  Filled 2012-12-03 (×3): qty 1

## 2012-12-03 MED ORDER — ADULT MULTIVITAMIN W/MINERALS CH
1.0000 | ORAL_TABLET | Freq: Every morning | ORAL | Status: DC
  Administered 2012-12-03 – 2012-12-05 (×3): 1 via ORAL
  Filled 2012-12-03 (×3): qty 1

## 2012-12-03 MED ORDER — ONDANSETRON HCL 4 MG/2ML IJ SOLN: 4.0000 mg | Freq: Four times a day (QID) | INTRAMUSCULAR | Status: DC | PRN

## 2012-12-03 MED ORDER — HYDROCORTISONE SOD SUCCINATE 100 MG IJ SOLR
50.0000 mg | Freq: Three times a day (TID) | INTRAMUSCULAR | Status: DC
  Administered 2012-12-03 – 2012-12-04 (×4): 50 mg via INTRAVENOUS
  Filled 2012-12-03: qty 2
  Filled 2012-12-03: qty 1
  Filled 2012-12-03: qty 2
  Filled 2012-12-03: qty 1
  Filled 2012-12-03: qty 2
  Filled 2012-12-03: qty 1
  Filled 2012-12-03: qty 2

## 2012-12-03 MED ORDER — VITAMIN D (ERGOCALCIFEROL) 1.25 MG (50000 UNIT) PO CAPS
50000.0000 [IU] | ORAL_CAPSULE | ORAL | Status: DC
  Administered 2012-12-05: 50000 [IU] via ORAL
  Filled 2012-12-03: qty 1

## 2012-12-03 MED ORDER — SIMVASTATIN 40 MG PO TABS
40.0000 mg | ORAL_TABLET | Freq: Every day | ORAL | Status: DC
  Administered 2012-12-03: 40 mg via ORAL
  Filled 2012-12-03 (×2): qty 1

## 2012-12-03 MED ORDER — SODIUM CHLORIDE 0.9 % IV SOLN
INTRAVENOUS | Status: AC
  Administered 2012-12-03: 03:00:00 via INTRAVENOUS

## 2012-12-03 MED ORDER — PYRIDOSTIGMINE BROMIDE 60 MG PO TABS
30.0000 mg | ORAL_TABLET | Freq: Three times a day (TID) | ORAL | Status: DC
  Administered 2012-12-03 – 2012-12-04 (×6): 30 mg via ORAL
  Administered 2012-12-05: 11:00:00 via ORAL
  Filled 2012-12-03 (×10): qty 0.5

## 2012-12-03 MED ORDER — SODIUM CHLORIDE 0.9 % IJ SOLN
3.0000 mL | Freq: Two times a day (BID) | INTRAMUSCULAR | Status: DC
  Administered 2012-12-03 – 2012-12-05 (×6): 3 mL via INTRAVENOUS

## 2012-12-03 MED ORDER — ACETAMINOPHEN 325 MG PO TABS: 650.0000 mg | ORAL_TABLET | Freq: Four times a day (QID) | ORAL | Status: DC | PRN

## 2012-12-03 MED ORDER — PANTOPRAZOLE SODIUM 40 MG IV SOLR
40.0000 mg | INTRAVENOUS | Status: DC
  Administered 2012-12-03 – 2012-12-04 (×3): 40 mg via INTRAVENOUS
  Filled 2012-12-03 (×4): qty 40

## 2012-12-03 MED ORDER — AMLODIPINE BESYLATE 2.5 MG PO TABS
2.5000 mg | ORAL_TABLET | Freq: Every day | ORAL | Status: DC
  Administered 2012-12-03 – 2012-12-05 (×3): 2.5 mg via ORAL
  Filled 2012-12-03 (×3): qty 1

## 2012-12-03 MED ORDER — ONDANSETRON HCL 4 MG PO TABS: 4.0000 mg | ORAL_TABLET | Freq: Four times a day (QID) | ORAL | Status: DC | PRN

## 2012-12-03 NOTE — Progress Notes (Signed)
[facility] PROGRESS NOTE  Marvin [name]:[id number] DOB: [birth year] DOA: 12/02/2012 PCP: [name], MD  Brief narrative: 79 y.o. male with past medical history of lung cancer status post radiation therapy, ocular myasthenia gravis, hypertension, peripheral arterial disease who presented to [facility] ED 12/02/12 with sudden onset rectal bleed, multiple episodes In the ER initially he was found to be mildly hypotensive  (93/68) but this improved with IV  fluids. His hemoglobin was 10.5 which was lower then previous compared values of around 12.   Assessment/Plan:  Principal Problem:   Rectal bleed - possible bleeding diverticuli triggered by aspirin; hold aspirin - last colonoscopy in 1945 - Hemoglobin is 8.4 now; no indications for transfusion yet; continue to monitor to signs of bleed - follow up CBC Active Problems:   Ocular myasthenia gravis - continue pyridostigmine    Hypertension - antihypertensive meds on hold due to borderline low BP (norvasc, lasix and Hctz)   CKD (chronic kidney disease), stage 3 - creatinine around the patient's baseline of 1.6   BPH - we will restart flomax   Dyslipidemia - we will restart atorvastatin   Code Status: full code Family Communication: no family at bedside Disposition Plan: home when stable  Manson Passey, MD  [facility] Pager [phone]  If 7PM-7AM, please contact night-coverage [email or web address] Password [email or web address] 12/03/2012, 6:25 AM   LOS: 1 day   Consultants:  None   Procedures:  None   Antibiotics:  None   HPI/Subjective: No acute overnight events.  Objective: Filed Vitals:   12/03/12 0300 12/03/12 0400 12/03/12 0500 12/03/12 0600  BP: 170/69 141/48 164/53 133/52  Pulse: 95 87 88 86  Temp: 97.7 F (36.5 C) 98.2 F (36.8 C)    TempSrc: Oral Oral    Resp: 18 14 16 13   Height: 5\' 7"  (1.702 m)     Weight: 82 kg (180 lb 12.4 oz)     SpO2: 94% 95% 91% 97%    Intake/Output Summary (Last 24 hours) at 12/03/12  8657 Last data filed at 12/03/12 0600  Gross per 24 hour  Intake  252.5 ml  Output      1 ml  Net  251.5 ml    Exam:   General:  Pt is alert, follows commands appropriately, not in acute distress  Cardiovascular: Regular rate and rhythm, S1/S2, SEM appreciated  Respiratory: Clear to auscultation bilaterally, no wheezing, no crackles, no rhonchi  Abdomen: Soft, non tender, non distended, bowel sounds present, no guarding  Extremities: No edema, pulses DP and PT palpable bilaterally  Neuro: Grossly nonfocal  Data Reviewed: Basic Metabolic Panel:  Recent Labs Lab 12/02/12 2330 12/03/12 0309  NA 139 136  K 4.0 3.8  CL 101 98  CO2 22 27  GLUCOSE 224* 214*  BUN 44* 46*  CREATININE 1.60* 1.47*  CALCIUM 8.9 9.0   CBC:  Recent Labs Lab 12/02/12 2330 12/03/12 0309  WBC 15.0* 12.2*  HGB 10.5* 9.6*  HCT 30.9* 28.1*  MCV 92.5 92.7  PLT 206 181   CBG:  Recent Labs Lab 12/03/12 0549  GLUCAP 197*    MRSA PCR SCREENING     Status: None   Collection Time    12/03/12  2:55 AM      Result Value Range Status   MRSA by PCR NEGATIVE  NEGATIVE Final     Studies: No results found.  Scheduled Meds: .  (SOLU-CORTEF)   50 mg Intravenous Q8H  . pantoprazole  40 mg Intravenous Q24H  . pyridostigmine  30 mg Oral TID  . sodium chloride  3 mL Intravenous Q12H   Continuous Infusions: . sodium chloride 75 mL/hr at 12/03/12 [phone]

## 2012-12-03 NOTE — H&P (Addendum)
[facility] History and Physical  MARQUEL POTTENGER ZOX:[id number] DOB: Dec 27, 1913 DOA: 12/02/2012  Referring physician: Beatris Ship. PCP: [name], MD  Chief Complaint: Rectal bleeding.  HPI: Marvin [name] is a 79 y.o. male with history of lung cancer status post radiation therapy, ocular myasthenia gravis, hypertension, peripheral arterial disease started to experience rectal bleeding last evening. Patient has had multiple episodes of frank rectal bleeding. Denies any associated nausea vomiting or abdominal pain fever chills. Has not had similar episodes before. He takes aspirin otherwise denies any NSAIDs. In the ER initially was found to be mildly hypotensive but improved with fluids. His hemoglobin was 2 g less than the previous. At this time patient will be admitted for further management. Most likely source could be lower GI. Patient states the last time he had a scope done was in 1945.  Review of Systems: As presented in the history of presenting illness, rest negative.  Past Medical History  Diagnosis Date  . Hyperlipidemia   . Hypertension   . Dizziness   . Blood clot in vein   . Arthritis   . Peripheral arterial disease   . Carotid artery occlusion   . Ocular myasthenia gravis   . BPH (benign prostatic hyperplasia)   . Nephrolithiasis   . Low back pain   . Gait disorder   . Lung cancer 05/2012    left  . Hx of radiation therapy 07/06/12, 07/08/12, 07/13/12    left upper lung nodule, 3 fractions   Past Surgical History  Procedure Laterality Date  . Appendectomy    . Cataract extraction      bilateral  . Retinal detachment surgery    . Thyroidectomy, partial     Social History:  reports that he has quit smoking. His smoking use included Cigarettes. He has a 60 pack-year smoking history. He quit smokeless tobacco use about 42 years ago. He reports that he does not drink alcohol or use illicit drugs. Lives at home. where does patient live-- Can do  ADLs. Can patient participate in ADLs?  No Known Allergies  Family History  Problem Relation Age of Onset  . Cancer Mother   . Pneumonia Father   . Stroke Sister       Prior to Admission medications   Medication Sig Start Date End Date Taking? Authorizing Provider  amLODipine (NORVASC) 2.5 MG tablet Take 2.5 mg by mouth every morning.    Yes Historical Provider, MD  aspirin EC 325 MG tablet Take 325 mg by mouth daily with lunch.    Yes Historical Provider, MD  calcium carbonate (OS-CAL) 600 MG TABS Take 600 mg by mouth daily with lunch.   Yes Historical Provider, MD  furosemide (LASIX) 20 MG tablet Take 20 mg by mouth every morning.   Yes Historical Provider, MD  hydrochlorothiazide (HYDRODIURIL) 25 MG tablet Take 12.5 mg by mouth every morning.    Yes Historical Provider, MD  Multiple Vitamin (MULTIVITAMIN WITH MINERALS) TABS Take 1 tablet by mouth every morning.    Yes Historical Provider, MD  OVER THE COUNTER MEDICATION Take 1 tablet by mouth every morning. OTC potassium supplement   Yes Historical Provider, MD  predniSONE (DELTASONE) 5 MG tablet Take 5 mg by mouth 3 (three) times daily.    Yes Historical Provider, MD  pyridostigmine (MESTINON) 60 MG tablet Take 30 mg by mouth 3 (three) times daily.    Yes Historical Provider, MD  simvastatin (ZOCOR) 40 MG tablet Take 40 mg by mouth at bedtime.  Yes Historical Provider, MD  Tamsulosin HCl (FLOMAX) 0.4 MG CAPS Take 0.4 mg by mouth at bedtime.   Yes Historical Provider, MD  Vitamin D, Ergocalciferol, (DRISDOL) 50000 UNITS CAPS Take 50,000 Units by mouth every 7 (seven) days. On Sundays   Yes Historical Provider, MD   Physical Exam: Filed Vitals:   12/02/12 2250 12/03/12 0001  BP: 93/68 131/54  Pulse: 99 103  Temp: 97.7 F (36.5 C)   TempSrc: Oral   Resp: 18 15  SpO2: 95% 95%     General:  Well-developed and nourished.  Eyes: Anicteric no pallor.  ENT: No discharge from ears eyes nose and mouth.  Neck: No mass  felt.  Cardiovascular: S1-S2 heard.  Respiratory: No rhonchi or crepitations.  Abdomen: Soft nontender bowel sounds present.  Skin: No rash.  Musculoskeletal: No edema.  Psychiatric: Appears normal.  Neurologic: Alert awake oriented to time place and person. Moves all extremities.  Labs on Admission:  Basic Metabolic Panel:  Recent Labs Lab 12/02/12 2330  NA 139  K 4.0  CL 101  CO2 22  GLUCOSE 224*  BUN 44*  CREATININE 1.60*  CALCIUM 8.9   Liver Function Tests: No results found for this basename: AST, ALT, ALKPHOS, BILITOT, PROT, ALBUMIN,  in the last 168 hours No results found for this basename: LIPASE, AMYLASE,  in the last 168 hours No results found for this basename: AMMONIA,  in the last 168 hours CBC:  Recent Labs Lab 12/02/12 2330  WBC 15.0*  HGB 10.5*  HCT 30.9*  MCV 92.5  PLT 206   Cardiac Enzymes: No results found for this basename: CKTOTAL, CKMB, CKMBINDEX, TROPONINI,  in the last 168 hours  BNP (last 3 results) No results found for this basename: PROBNP,  in the last 8760 hours CBG: No results found for this basename: GLUCAP,  in the last 168 hours  Radiological Exams on Admission: No results found.   Assessment/Plan Principal Problem:   Rectal bleed Active Problems:   PAD (peripheral artery disease)   Ocular myasthenia gravis   Hypertension   Hyperlipidemia   Lung cancer   CKD (chronic kidney disease)   1. Rectal bleeding - painless rectal bleeding most likely source could be lower GI bleed. At this time patient will be kept n.p.o. in anticipation of possible procedure later. Frequent CBC checks. Continue with IV hydration. IV protonix. Hold aspirin. Consult GI. 2. Ocular myasthenia gravis - until patient can reliably take orally patient will be on stress dose steroids IV as patient used to be on prednisone. Continue Mestinon. 3. Hypertension - patient was initially mildly hypotensive. We'll hold antihypertensives for now and  closely follow blood pressure trends. 4. Hyperlipidemia - continue statins when patient can take orally. 5. Chronic kidney disease - creatinine appears at baseline. Closely follow intake output and daily weights. 6. History of lung cancer status post radiation. 7. History of peripheral artery disease. 8. Hyperglycemia - probably steroid related. Check hemoglobin A1c.  9. Leukocytosis - probably reactionary and also secondary to steroids. Patient is afebrile. Closely follow WBC trends.    Code Status: Full code.  Family Communication: None.  Disposition Plan: Admit to inpatient.    Boyde Grieco N. [facility] Pager [phone].  If 7PM-7AM, please contact night-coverage [email or web address] Password [place] 12/03/2012, 12:56 AM

## 2012-12-03 NOTE — ED Notes (Signed)
Called to give report nurse unavailable will call back.  

## 2012-12-03 NOTE — Progress Notes (Signed)
CARE MANAGEMENT NOTE 12/03/2012  Patient:  Marvin [name], Marvin [name]   Account Number:  [id number]  Date Initiated:  12/03/2012  Documentation initiated by:  Coleman Kalas  Subjective/Objective Assessment:   pt with gi bleed hgb dropping  hypotensive, 79 y.o. with hx of ca and recent readiation therapy.     Action/Plan:   patient lives alone does have family   Anticipated DC Date:  12/06/2012   Anticipated DC Plan:  HOME/SELF CARE  In-house referral  NA      DC Planning Services  NA      [facility] Choice  NA   Choice offered to / List presented to:  NA   DME arranged  NA      DME agency  NA     HH arranged  NA      HH agency  NA   Status of service:  In process, will continue to follow Medicare Important Message given?  NA - LOS <3 / Initial given by admissions (If response is "NO", the following Medicare IM given date fields will be blank) Date Medicare IM given:   Date Additional Medicare IM given:    Discharge Disposition:    Per UR Regulation:  Reviewed for med. necessity/level of care/duration of stay  If discussed at Long Length of Stay Meetings, dates discussed:    Comments:  [id number] Earlene Plater, RN, BSN, CCM:  CHART REVIEWED AND UPDATED.  Next chart review due on [id number]. NO DISCHARGE NEEDS PRESENT AT THIS TIME. CASE MANAGEMENT [phone]

## 2012-12-04 DIAGNOSIS — N400 Enlarged prostate without lower urinary tract symptoms: Secondary | ICD-10-CM

## 2012-12-04 DIAGNOSIS — E785 Hyperlipidemia, unspecified: Secondary | ICD-10-CM

## 2012-12-04 LAB — GLUCOSE, CAPILLARY
Glucose-Capillary: 119 mg/dL — ABNORMAL HIGH (ref 70–99)
Glucose-Capillary: 130 mg/dL — ABNORMAL HIGH (ref 70–99)

## 2012-12-04 LAB — BASIC METABOLIC PANEL
CO2: 27 mEq/L (ref 19–32)
Calcium: 8.2 mg/dL — ABNORMAL LOW (ref 8.4–10.5)
Creatinine, Ser: 1.25 mg/dL (ref 0.50–1.35)
GFR calc non Af Amer: 47 mL/min — ABNORMAL LOW (ref >90)
Glucose, Bld: 137 mg/dL — ABNORMAL HIGH (ref 70–99)

## 2012-12-04 LAB — CBC
Hemoglobin: 7.8 g/dL — ABNORMAL LOW (ref 13.0–17.0)
MCH: 32 pg (ref 26.0–34.0)
MCHC: 34.5 g/dL (ref 30.0–36.0)
MCV: 92.6 fL (ref 78.0–100.0)
Platelets: 144 10*3/uL — ABNORMAL LOW (ref 150–400)
RBC: 2.44 MIL/uL — ABNORMAL LOW (ref 4.22–5.81)

## 2012-12-04 MED ORDER — PREDNISONE 50 MG PO TABS
50.0000 mg | ORAL_TABLET | Freq: Every day | ORAL | Status: DC
  Administered 2012-12-04 – 2012-12-05 (×2): 50 mg via ORAL
  Filled 2012-12-04 (×4): qty 1

## 2012-12-04 MED ORDER — PREDNISONE 50 MG PO TABS
50.0000 mg | ORAL_TABLET | Freq: Every day | ORAL | Status: DC
  Filled 2012-12-04: qty 1

## 2012-12-04 MED ORDER — ATORVASTATIN CALCIUM 20 MG PO TABS
20.0000 mg | ORAL_TABLET | Freq: Every day | ORAL | Status: DC
  Administered 2012-12-04: 20 mg via ORAL
  Filled 2012-12-04 (×2): qty 1

## 2012-12-04 NOTE — Progress Notes (Signed)
[facility] PROGRESS NOTE  Marvin [name] ZOX:[id number] DOB: 05-23-1914 DOA: 12/02/2012 PCP: [name], MD  Brief narrative: 79 y.o. male with past medical history of lung cancer status post radiation therapy, ocular myasthenia gravis, hypertension, peripheral arterial disease who presented to [facility] ED 12/02/12 with sudden onset rectal bleed, multiple episodes  In the ER initially he was found to be mildly hypotensive (93/68) but this improved with IV fluids. His hemoglobin was 10.5 which was lower then previous compared values of around 12.   Assessment/Plan:   Principal Problem:  *Rectal bleed  - possible bleeding diverticuli triggered by aspirin; aspirin still on hold - last colonoscopy in 1945  - Hemoglobin dropped further down to 7.8; we will transfuse 1 unit PRBC today - follow up post transfusion CBC  Active Problems:  Ocular myasthenia gravis  - continue pyridostigmine  Hypertension  - We started Norvasc 2.5 mg daily CKD (chronic kidney disease), stage 3  - creatinine around the patient's baseline of 1.6  - Creatinine trending down, 1.47 today BPH  - Continue flomax  Dyslipidemia  - Continue atorvastatin  Code Status: full code  Family Communication: no family at bedside  Disposition Plan: home when stable   Manson Passey, MD  [facility]  Pager [phone]  Consultants:  None  Procedures:  1 unit PRBC transfusion 12/04/2012  If 7PM-7AM, please contact night-coverage [email or web address] Password [email or web address] 12/04/2012, 8:25 AM   LOS: 2 days    HPI/Subjective: No acute overnight events.  Objective: Filed Vitals:   12/04/12 0200 12/04/12 0400 12/04/12 0500 12/04/12 0816  BP: 146/44 138/52  144/106  Pulse: 66 65  98  Temp:  97.7 F (36.5 C)    TempSrc:  Oral    Resp: 15 12  18   Height:      Weight:   83.7 kg (184 lb 8.4 oz)   SpO2: 91% 94%  96%    Intake/Output Summary (Last 24 hours) at 12/04/12 0825 Last data filed at 12/04/12 0600  Gross per 24 hour   Intake   1520 ml  Output    925 ml  Net    595 ml    Exam:   General:  Pt is alert, follows commands appropriately, not in acute distress  Cardiovascular: Regular rate and rhythm, S1/S2, no murmurs, no rubs, no gallops  Respiratory: Clear to auscultation bilaterally, no wheezing, no crackles, no rhonchi  Abdomen: Soft, non tender, non distended, bowel sounds present, no guarding  Extremities: No edema, pulses DP and PT palpable bilaterally  Neuro: Grossly nonfocal  Data Reviewed: Basic Metabolic Panel:  Recent Labs Lab 12/02/12 2330 12/03/12 0309  NA 139 136  K 4.0 3.8  CL 101 98  CO2 22 27  GLUCOSE 224* 214*  BUN 44* 46*  CREATININE 1.60* 1.47*  CALCIUM 8.9 9.0   Liver Function Tests: No results found for this basename: AST, ALT, ALKPHOS, BILITOT, PROT, ALBUMIN,  in the last 168 hours No results found for this basename: LIPASE, AMYLASE,  in the last 168 hours No results found for this basename: AMMONIA,  in the last 168 hours CBC:  Recent Labs Lab 12/02/12 2330 12/03/12 0309 12/03/12 0625 12/03/12 0945 12/04/12 0720  WBC 15.0* 12.2* 9.2 8.8 9.6  HGB 10.5* 9.6* 8.4* 8.0* 7.8*  HCT 30.9* 28.1* 24.6* 24.1* 22.6*  MCV 92.5 92.7 92.1 93.1 92.6  PLT 206 181 151 157 144*   Cardiac Enzymes: No results found for this basename: CKTOTAL, CKMB, CKMBINDEX, TROPONINI,  in the last 168  hours BNP: No components found with this basename: POCBNP,  CBG:  Recent Labs Lab 12/03/12 0549 12/03/12 1105 12/03/12 1744 12/04/12 0033 12/04/12 0607  GLUCAP 197* 207* 126* 119* 130*    Recent Results (from the past 240 hour(s))  MRSA PCR SCREENING     Status: None   Collection Time    12/03/12  2:55 AM      Result Value Range Status   MRSA by PCR NEGATIVE  NEGATIVE Final     Studies: No results found.  Scheduled Meds: . amLODipine  2.5 mg Oral Daily  .  (SOLU-CORTEF) inj  50 mg Intravenous Q8H  . multivitamin   1 tablet Oral q morning - 10a  . pantoprazole    40 mg Intravenous Q24H  . pyridostigmine  30 mg Oral TID  . simvastatin  40 mg Oral QHS  . tamsulosin  0.4 mg Oral QHS

## 2012-12-05 LAB — CBC
HCT: 27.2 % — ABNORMAL LOW (ref 39.0–52.0)
Hemoglobin: 9.2 g/dL — ABNORMAL LOW (ref 13.0–17.0)
MCH: 30.6 pg (ref 26.0–34.0)
MCHC: 33.8 g/dL (ref 30.0–36.0)
RDW: 16.6 % — ABNORMAL HIGH (ref 11.5–15.5)

## 2012-12-05 LAB — TYPE AND SCREEN

## 2012-12-05 LAB — HEMOGLOBIN AND HEMATOCRIT, BLOOD
HCT: 27.2 % — ABNORMAL LOW (ref 39.0–52.0)
Hemoglobin: 9.4 g/dL — ABNORMAL LOW (ref 13.0–17.0)

## 2012-12-05 MED ORDER — PANTOPRAZOLE SODIUM 40 MG PO TBEC: 40.0000 mg | DELAYED_RELEASE_TABLET | Freq: Every day | ORAL | Status: DC

## 2012-12-05 NOTE — Care Management (Signed)
   CARE MANAGEMENT NOTE 12/05/2012  Patient:  Marvin [name], Marvin [name]   Account Number:  [id number]  Date Initiated:  12/03/2012  Documentation initiated by:  DAVIS,RHONDA  Subjective/Objective Assessment:   pt with gi bleed hgb dropping  hypotensive, 79 y.o. with hx of ca and recent readiation therapy.     Action/Plan:   patient lives alone does have family   Anticipated DC Date:  12/05/2012   Anticipated DC Plan:  HOME W HOME HEALTH SERVICES  In-house referral  NA      DC Planning Services  CM consult      [facility] Choice  HOME HEALTH  DURABLE MEDICAL EQUIPMENT   Choice offered to / List presented to:  C-3 Spouse   DME arranged  [facility] - Lavone Nian      DME agency  [facility]     HH arranged  HH-2 PT  HH-3 OT  HH-4 NURSE'S AIDE      HH agency  [facility]   Status of service:  Completed, signed off Medicare Important Message given?  NA - LOS <3 / Initial given by admissions (If response is "NO", the following Medicare IM given date fields will be blank) Date Medicare IM given:   Date Additional Medicare IM given:    Discharge Disposition:  HOME W HOME HEALTH SERVICES  Per UR Regulation:  Reviewed for med. necessity/level of care/duration of stay  If discussed at Long Length of Stay Meetings, dates discussed:    Comments:  12/05/12 Reed Breech [phone] Patient will need home health services arranged. Spoke with wife who initally chose [facility]. Spoke with [facility] on call and she stated she is not sure about the availability for the [place] office. Asked that the office be called back on Monday. Informed oncall that this is not an option. Made patient's wife aware and she chose [facility] for [facility] services. Asher Muir with [facility] aware of referral for DME and HHC services. No further needs assessed. Reed Breech [phone]    [id number] Earlene Plater, RN, BSN, CCM:  CHART REVIEWED AND UPDATED.  Next chart review due on [id number]. NO  DISCHARGE NEEDS PRESENT AT THIS TIME. CASE MANAGEMENT [phone]

## 2012-12-05 NOTE — Discharge Summary (Signed)
Physician Discharge Summary  Marvin [name] ZOX:[id number] DOB: November 10, 1913 DOA: 12/02/2012  PCP: [name], MD  Admit date: 12/02/2012 Discharge date: 12/05/2012  Recommendations for Outpatient Follow-up:  1. Patient has appointment with primary care provider 12/06/2012. Patient was instructed to check hemoglobin during that visit to make sure it is stable. Hold aspirin until instructed by PCP you can resume it.  Discharge Diagnoses:  Principal Problem:   Rectal bleed Active Problems:   Ocular myasthenia gravis   Hypertension   CKD (chronic kidney disease)   BPH (benign prostatic hyperplasia)   Dyslipidemia  Discharge Condition: Medically stable for discharge home today. Order placed for home health physical therapy, occupational therapy and home health aide; order placed for rolling walker  Diet recommendation: As tolerated  History of present illness:  79 y.o. male with past medical history of lung cancer status post radiation therapy, ocular myasthenia gravis, hypertension, peripheral arterial disease who presented to [facility] ED 12/02/12 with sudden onset rectal bleed, multiple episodes  In the ER initially he was found to be mildly hypotensive (93/68) but this improved with IV fluids. His hemoglobin was 10.5 which was lower then previous compared values of around 12.   Assessment/Plan:   Principal Problem:  *Rectal bleed  - possible bleeding diverticuli triggered by aspirin; aspirin still on hold  - last colonoscopy in 1945  - Hemoglobin dropped further down to 7.8; status post 1 unit of transfusion of PRBC 12/04/2012 - follow up post transfusion CBC - 9.2 Active Problems:  Ocular myasthenia gravis  - continue pyridostigmine  Hypertension  - We started Norvasc 2.5 mg daily  CKD (chronic kidney disease), stage 3  - creatinine around the patient's baseline of 1.6  - Creatinine trending down and now it is within normal limits BPH  - Continue flomax  Dyslipidemia  -  Continue atorvastatin   Code Status: full code  Family Communication: no family at bedside    Manson Passey, MD  [facility]  Pager [phone]   Consultants:  None Procedures:  1 unit PRBC transfusion 12/04/2012   Discharge Exam: Filed Vitals:   12/05/12 0502  BP: 177/63  Pulse: 85  Temp: 97.5 F (36.4 C)  Resp: 18   Filed Vitals:   12/04/12 1430 12/04/12 1510 12/04/12 2128 12/05/12 0502  BP: 138/45 147/53 154/68 177/63  Pulse: 70 82 94 85  Temp: 97.3 F (36.3 C) 97.3 F (36.3 C) 97.2 F (36.2 C) 97.5 F (36.4 C)  TempSrc: Oral Oral Oral Oral  Resp: 16 22 20 18   Height:      Weight:      SpO2:   96% 97%    General: Pt is alert, follows commands appropriately, not in acute distress Cardiovascular: Regular rate and rhythm, S1/S2 +, no murmurs, no rubs, no gallops Respiratory: Clear to auscultation bilaterally, no wheezing, no crackles, no rhonchi Abdominal: Soft, non tender, non distended, bowel sounds +, no guarding Extremities: no edema, no cyanosis, pulses palpable bilaterally DP and PT Neuro: Grossly nonfocal  Discharge Instructions  Discharge Orders   Future Appointments Provider Department Dept Phone   12/14/2012 11:00 AM York Spaniel, MD [facility] [phone]   12/15/2012 9:00 AM Myeong Eyvonne Left [facility] [phone]   04/25/2013 1:00 PM Billie Lade, MD [facility] [phone]   Future Orders Complete By Expires     Call MD for:  difficulty breathing, headache or visual disturbances  As directed     Call MD for:  persistant dizziness or light-headedness  As directed     Call MD for:  persistant nausea and vomiting  As directed     Call MD for:  severe uncontrolled pain  As directed     Diet - low sodium heart healthy  As directed     Discharge instructions  As directed     Comments:      Please stop taking aspirin, recheck hemoglobin with primary care physician and if stable may resume  aspirin.    Increase activity slowly  As directed         Medication List    STOP taking these medications       aspirin EC 325 MG tablet      TAKE these medications       amLODipine 2.5 MG tablet  Commonly known as:  NORVASC  Take 2.5 mg by mouth every morning.     calcium carbonate 600 MG Tabs  Commonly known as:  OS-CAL  Take 600 mg by mouth daily with lunch.     furosemide 20 MG tablet  Commonly known as:  LASIX  Take 20 mg by mouth every morning.     hydrochlorothiazide 25 MG tablet  Commonly known as:  HYDRODIURIL  Take 12.5 mg by mouth every morning.     multivitamin with minerals Tabs  Take 1 tablet by mouth every morning.     OVER THE COUNTER MEDICATION  Take 1 tablet by mouth every morning. OTC potassium supplement     pantoprazole 40 MG tablet  Commonly known as:  PROTONIX  Take 1 tablet (40 mg total) by mouth daily.     predniSONE 5 MG tablet  Commonly known as:  DELTASONE  Take 5 mg by mouth 3 (three) times daily.     pyridostigmine 60 MG tablet  Commonly known as:  MESTINON  Take 30 mg by mouth 3 (three) times daily.     simvastatin 40 MG tablet  Commonly known as:  ZOCOR  Take 40 mg by mouth at bedtime.     tamsulosin 0.4 MG Caps  Commonly known as:  FLOMAX  Take 0.4 mg by mouth at bedtime.     Vitamin D (Ergocalciferol) 50000 UNITS Caps  Commonly known as:  DRISDOL  Take 50,000 Units by mouth every 7 (seven) days. On Sundays           Follow-up Information   Follow up with [name], MD On 12/06/2012.   Contact information:   [address] [place] [address] [phone]        The results of significant diagnostics from this hospitalization (including imaging, microbiology, ancillary and laboratory) are listed below for reference.    Significant Diagnostic Studies: Ct Chest Wo Contrast  11/09/2012  *RADIOLOGY REPORT*  Clinical Data: Lung cancer status post SBRT.  Evaluate for response to  treatment.  CT CHEST WITHOUT CONTRAST  Technique:  Multidetector CT imaging of the chest was performed following the standard protocol without IV contrast.  Comparison: Chest CT 05/28/2012.  Findings:  Mediastinum: Heart size is normal. There is no significant pericardial fluid, thickening or pericardial calcification. No pathologically enlarged mediastinal or hilar lymph nodes. Please note that accurate exclusion of hilar adenopathy is limited on noncontrast CT scans. There is atherosclerosis of the thoracic aorta, the great vessels of the mediastinum and the coronary arteries, including calcified atherosclerotic plaque in the left main, left anterior descending, left circumflex and right coronary arteries. Esophagus is unremarkable in appearance.  Heterogeneous thyroid gland with asymmetric enlargement of the right lobe of the gland which contains multiple internal hypoattenuating areas and multiple coarse calcifications.  Lungs/Pleura: Previously noted left upper lobe nodule has decreased in size and currently measures 12 x 9 mm (image 14 of series six). This is now surrounded by patchy areas of ground-glass attenuation, septal thickening and architectural distortion, compatible with evolving postradiation changes.  No other definite suspicious- appearing pulmonary nodules or masses are noted.  There is a pattern of fine subpleural reticulation throughout the lungs bilaterally which has slightly increased compared to the prior study, suspicious for a developing interstitial lung disease (at this point, the pattern is nonspecific).  Previously noted nodular area of ground-glass attenuation in the superior segment of the right lower lobe is very similar in size and appearance to the prior study, currently measuring 2.7 x 1.9 cm (no central solid component is identified).  Other subtle area of ground-glass attenuation noted in the lateral aspect of the right upper lobe on the prior study has resolved on today's  examination.  Upper Abdomen: A small amount of pneumobilia is again noted, compatible with prior sphincterotomy.  Multiple renal lesions bilaterally are incompletely characterized on today's noncontrast CT examination, however, what appears to be a complex cystic lesion in the upper pole of the right kidney anteriorly has some calcifications associated with the wall.  This lesion is similar in size to the prior study measuring 2.1 cm in diameter on today's examination.  Numerous calcifications in the spleen are compatible with calcified granulomas. Extensive atherosclerosis.  Musculoskeletal: There are no aggressive appearing lytic or blastic lesions noted in the visualized portions of the skeleton.  IMPRESSION: 1. Today's study demonstrates a positive response to therapy with decrease in size of left upper lobe nodule which currently measures 12 x 9 mm, and surrounding postradiation changes.  Today's study is a new baseline for future follow up examinations. 2.  Previously noted nodular area of ground-glass attenuation in the superior segment of the right lower lobe is unchanged and nonspecific, but warrants continued attention on follow-up studies to exclude the possibility of a low grade adenocarcinoma. 3. Atherosclerosis, including left main and three-vessel coronary artery disease. 4.  Asymmetric enlargement of the right lobe of the thyroid gland is nonspecific, but similar to the prior examination, favored to represent asymmetric goiter. 5.  Additional incidental findings, similar to prior studies, as above.   Original Report Authenticated By: Trudie Reed, M.D.     Microbiology: Recent Results (from the past 240 hour(s))  MRSA PCR SCREENING     Status: None   Collection Time    12/03/12  2:55 AM      Result Value Range Status   MRSA by PCR NEGATIVE  NEGATIVE Final   Comment:            The GeneXpert MRSA Assay (FDA     approved for NASAL specimens     only), is one component of a      comprehensive MRSA colonization     surveillance program. It is not     intended to diagnose MRSA     infection nor to guide or     monitor treatment for     MRSA infections.     Labs: Basic Metabolic Panel:  Recent Labs Lab 12/02/12 2330 12/03/12 0309 12/04/12 0720  NA 139 136 141  K 4.0 3.8 4.1  CL 101 98 107  CO2 22 27 27   GLUCOSE 224* 214* 137*  BUN 44* 46* 39*  CREATININE 1.60* 1.47* 1.25  CALCIUM 8.9 9.0 8.2*   Liver Function Tests: No results found for this basename: AST, ALT, ALKPHOS, BILITOT, PROT, ALBUMIN,  in the last 168 hours No results found for this basename: LIPASE, AMYLASE,  in the last 168 hours No results found for this basename: AMMONIA,  in the last 168 hours CBC:  Recent Labs Lab 12/03/12 0309 12/03/12 0625 12/03/12 0945 12/04/12 0720 12/05/12 0820  WBC 12.2* 9.2 8.8 9.6 11.2*  HGB 9.6* 8.4* 8.0* 7.8* 9.2*  HCT 28.1* 24.6* 24.1* 22.6* 27.2*  MCV 92.7 92.1 93.1 92.6 90.4  PLT 181 151 157 144* 166   Cardiac Enzymes: No results found for this basename: CKTOTAL, CKMB, CKMBINDEX, TROPONINI,  in the last 168 hours BNP: BNP (last 3 results) No results found for this basename: PROBNP,  in the last 8760 hours CBG:  Recent Labs Lab 12/03/12 1105 12/03/12 1744 12/04/12 0033 12/04/12 0607 12/04/12 1228  GLUCAP 207* 126* 119* 130* 97    Time coordinating discharge: Over 30 minutes  Signed:  Manson Passey, MD  [facility]  12/05/2012, 9:50 AM  Pager #: [phone]

## 2012-12-05 NOTE — Progress Notes (Signed)
Pt ready for d/c. Went over d/c instructions with pt's wife, instructed to stop aspirin and to see PCP tomorrow to monitor pt's hemoglobin level. Removed PIV, small amount of bleeding afterwards, pressure applied, bleeding stopped. No change in pt's condition since AM assessment. Pt d/c'd to home with wife and daughter. Eugene Garnet RN

## 2012-12-14 ENCOUNTER — Encounter: Payer: Self-pay | Admitting: Neurology

## 2012-12-14 ENCOUNTER — Ambulatory Visit (INDEPENDENT_AMBULATORY_CARE_PROVIDER_SITE_OTHER): Payer: Medicare Other | Admitting: Neurology

## 2012-12-14 VITALS — BP 144/69 | HR 79 | Temp 97.4°F | Ht 67.0 in | Wt 192.0 lb

## 2012-12-14 DIAGNOSIS — G7000 Myasthenia gravis without (acute) exacerbation: Secondary | ICD-10-CM

## 2012-12-14 NOTE — Progress Notes (Signed)
Reason for visit: Myasthenia gravis  Marvin [name] is an 79 y.o. male  History of present illness:  Marvin [name] is a 79 year old right-handed white male with a history of myasthenia gravis with primarily ocular features. During the workup for his myasthenia gravis, he underwent a CT scan of the chest that revealed evidence of an apical lung tumor on the left. The patient has undergone radiation treatment for this, and he indicates that the radiation has shrunken the tumor. The patient has recently been in the hospital with a diverticular bleed, and he required one unit of packed red blood cells. The patient is now off of aspirin, but he eventually will be going back on 81 mg daily. The patient remains on prednisone taking 15 mg daily, and he takes Mestinon, 30 mg 3 times daily. The patient continues to have some problems with double vision, but he denies any problems with ptosis of the eyes. The patient has a large difference in visual acuity from one eye to the next, with the left eye being around 20/200, and the right side being around 20/50. The patient uses an eye patch to read or watch TV. The patient returns for an evaluation. The patient denies any problems with swallowing, any jaw weakness, or weakness of the extremities.  Past Medical History  Diagnosis Date  . Hyperlipidemia   . Hypertension   . Dizziness   . Blood clot in vein   . Arthritis   . Peripheral arterial disease   . Carotid artery occlusion   . Ocular myasthenia gravis   . BPH (benign prostatic hyperplasia)   . Nephrolithiasis   . Low back pain   . Gait disorder   . Hx of radiation therapy 07/06/12, 07/08/12, 07/13/12    left upper lung nodule, 3 fractions  . Lung cancer 05/2012    left  . Hearing difficulty   . Lumbago     Past Surgical History  Procedure Laterality Date  . Appendectomy    . Cataract extraction      bilateral  . Retinal detachment surgery    . Thyroidectomy, partial      Family  History  Problem Relation Age of Onset  . Cancer Mother   . Pneumonia Father   . Stroke Sister   . Hypertension Sister   . Heart disease Sister     Social history:  reports that he has quit smoking. His smoking use included Cigarettes. He has a 60 pack-year smoking history. He quit smokeless tobacco use about 42 years ago. He reports that he does not drink alcohol or use illicit drugs.  Allergies: No Known Allergies  Medications:  Current Outpatient Prescriptions on File Prior to Visit  Medication Sig Dispense Refill  . amLODipine (NORVASC) 2.5 MG tablet Take 2.5 mg by mouth every morning.       . calcium carbonate (OS-CAL) 600 MG TABS Take 600 mg by mouth daily with lunch.      . hydrochlorothiazide (HYDRODIURIL) 25 MG tablet Take 12.5 mg by mouth every morning.       . Multiple Vitamin (MULTIVITAMIN WITH MINERALS) TABS Take 1 tablet by mouth every morning.       Marland Kitchen OVER THE COUNTER MEDICATION Take 1 tablet by mouth every morning. OTC potassium supplement      . pantoprazole (PROTONIX) 40 MG tablet Take 1 tablet (40 mg total) by mouth daily.  30 tablet  0  . predniSONE (DELTASONE) 5 MG tablet Take 5 mg by  mouth 3 (three) times daily.       Marland Kitchen pyridostigmine (MESTINON) 60 MG tablet Take 30 mg by mouth 3 (three) times daily.       . simvastatin (ZOCOR) 40 MG tablet Take 40 mg by mouth at bedtime.        . Tamsulosin HCl (FLOMAX) 0.4 MG CAPS Take 0.4 mg by mouth at bedtime.      . Vitamin D, Ergocalciferol, (DRISDOL) 50000 UNITS CAPS Take 50,000 Units by mouth every 7 (seven) days. On Sundays       No current facility-administered medications on file prior to visit.    ROS:  Out of a complete 14 system review of symptoms, the patient complains only of the following symptoms, and all other reviewed systems are negative.  Double vision Fatigue Gait instability  Peripheral edema  Blood pressure 144/69, pulse 79, temperature 97.4 F (36.3 C), temperature source Oral, height 5\' 7"   (1.702 m), weight 192 lb (87.091 kg).  Physical Exam  General: The patient is alert and cooperative at the time of the examination.  Skin: 2+ peripheral edema is noted below the knees bilaterally.   Neurologic Exam  Cranial nerves: Facial symmetry is present. Speech is normal, no aphasia or dysarthria is noted. Extraocular movements are full. Visual fields are full. The patient is hard of hearing. The patient has good facial strength, and good jaw strength. With superior gaze for 1 minute, no increased ptosis or divergent gaze is noted. The patient does not report double vision.  Motor: The patient has good strength in all 4 extremities.With the arms outstretched for 1 minute, the patient has no fatigable weakness of the deltoid muscles.  Coordination: The patient has good finger-nose-finger and heel-to-shin bilaterally.  Gait and station: The patient has a  wide-based, slightly unsteady gait. The patient usually uses a walker for ambulation. Tandem gait was not attempted. Romberg is negative. No drift is seen.  Reflexes: Deep tendon reflexes are symmetric.   Assessment/Plan:  One. Myasthenia gravis with ocular features  2. Lung cancer  The patient will continue prednisone at 15 mg daily, and he will continue the Mestinon taking 30 mg 3 times daily. The patient will contact me if he has new symptoms regarding his myasthenia gravis. The patient will followup in about 4 months.  Marlan Palau MD 12/14/2012 7:26 PM  [facility] [address] [place], [place] [address]  Phone [phone] Fax [phone]

## 2012-12-15 ENCOUNTER — Ambulatory Visit: Payer: Self-pay | Admitting: Podiatry

## 2012-12-22 ENCOUNTER — Ambulatory Visit (INDEPENDENT_AMBULATORY_CARE_PROVIDER_SITE_OTHER): Payer: Medicare Other | Admitting: Podiatry

## 2012-12-22 VITALS — BP 176/71 | HR 74 | Ht 67.0 in | Wt 195.0 lb

## 2012-12-22 DIAGNOSIS — R600 Localized edema: Secondary | ICD-10-CM

## 2012-12-22 DIAGNOSIS — B351 Tinea unguium: Secondary | ICD-10-CM

## 2012-12-22 DIAGNOSIS — M25579 Pain in unspecified ankle and joints of unspecified foot: Secondary | ICD-10-CM | POA: Insufficient documentation

## 2012-12-22 NOTE — Progress Notes (Signed)
Subjective: 79 y.o. year old male patient presents accompanied by his son walking with a walker, complaining of painful nails. Patient requests toe nails trimmed. Son stated that Mr. Marvin [name] was in hospital a month ago from a bleeding intestine. He had received transfusion during hospital stay. Stated that he was told to use support hose daily for swollen foot.  Objective: Dermatologic: Thick yellow deformed nails x 10. Vascular: Dorsalis pedis arteries are palpable through swollen foot bilateral.  Posterior tibial arteries are not palpable.  Severe pedal edema bilateral, pitting 3+. Orthopedic: Rectus foot without gross osseous deformities.  Neurologic: All epicritic and tactile sensations grossly intact.  Assessment: Dystrophic mycotic nails x 10. Edema foot, pitting 3+ bilateral.  Treatment: All mycotic nails debrided.  Re-enforced about wearing compression socks. Return in 3 months or as needed.

## 2012-12-24 ENCOUNTER — Other Ambulatory Visit: Payer: Self-pay

## 2012-12-24 MED ORDER — PREDNISONE 5 MG PO TABS: 5.0000 mg | ORAL_TABLET | Freq: Three times a day (TID) | ORAL | Status: DC

## 2013-03-11 ENCOUNTER — Encounter: Payer: Self-pay | Admitting: Podiatry

## 2013-03-11 ENCOUNTER — Ambulatory Visit (INDEPENDENT_AMBULATORY_CARE_PROVIDER_SITE_OTHER): Payer: Medicare Other | Admitting: Podiatry

## 2013-03-11 VITALS — BP 170/68 | HR 63

## 2013-03-11 DIAGNOSIS — B351 Tinea unguium: Secondary | ICD-10-CM

## 2013-03-11 DIAGNOSIS — M25579 Pain in unspecified ankle and joints of unspecified foot: Secondary | ICD-10-CM

## 2013-03-11 DIAGNOSIS — L600 Ingrowing nail: Secondary | ICD-10-CM

## 2013-03-11 NOTE — Progress Notes (Signed)
79 year old male presents complaining of painful toes on both great toes and painful corn on 5th toe right foot and under the 5th MPJ area of right.  Walking with help of wheeled walker. Patient is wearing compression socks.   Objective: Severe pedal edema and wearing compression socks. Ingrown both great toe nails. Pedal pulses are not palpable due to excess fluid.   Assessment: Ingrown nail right hallux medial border. No drainage and no cellulitis noted. Hypertrophic nails x 10. Pedal edema bilateral. PVD.  Treatment: All nails debrided. Offending borders resected.  Return in 3 months or as needed.

## 2013-03-23 ENCOUNTER — Other Ambulatory Visit: Payer: Self-pay

## 2013-03-25 ENCOUNTER — Ambulatory Visit: Payer: Medicare Other | Admitting: Podiatry

## 2013-04-11 ENCOUNTER — Ambulatory Visit (HOSPITAL_COMMUNITY): Payer: Medicare Other

## 2013-04-13 ENCOUNTER — Emergency Department (HOSPITAL_COMMUNITY)
Admission: EM | Admit: 2013-04-13 | Discharge: 2013-04-13 | Disposition: A | Discharge status: Home / Self Care | Payer: Medicare Other | Attending: Emergency Medicine | Admitting: Emergency Medicine

## 2013-04-13 ENCOUNTER — Encounter (HOSPITAL_COMMUNITY): Payer: Self-pay | Admitting: Emergency Medicine

## 2013-04-13 DIAGNOSIS — Z8679 Personal history of other diseases of the circulatory system: Secondary | ICD-10-CM | POA: Insufficient documentation

## 2013-04-13 DIAGNOSIS — Z87891 Personal history of nicotine dependence: Secondary | ICD-10-CM | POA: Insufficient documentation

## 2013-04-13 DIAGNOSIS — Z87442 Personal history of urinary calculi: Secondary | ICD-10-CM | POA: Insufficient documentation

## 2013-04-13 DIAGNOSIS — M129 Arthropathy, unspecified: Secondary | ICD-10-CM | POA: Insufficient documentation

## 2013-04-13 DIAGNOSIS — Z923 Personal history of irradiation: Secondary | ICD-10-CM | POA: Insufficient documentation

## 2013-04-13 DIAGNOSIS — M7989 Other specified soft tissue disorders: Secondary | ICD-10-CM | POA: Insufficient documentation

## 2013-04-13 DIAGNOSIS — N400 Enlarged prostate without lower urinary tract symptoms: Secondary | ICD-10-CM | POA: Insufficient documentation

## 2013-04-13 DIAGNOSIS — Z8669 Personal history of other diseases of the nervous system and sense organs: Secondary | ICD-10-CM | POA: Insufficient documentation

## 2013-04-13 DIAGNOSIS — R600 Localized edema: Secondary | ICD-10-CM

## 2013-04-13 DIAGNOSIS — Z7982 Long term (current) use of aspirin: Secondary | ICD-10-CM | POA: Insufficient documentation

## 2013-04-13 DIAGNOSIS — Z79899 Other long term (current) drug therapy: Secondary | ICD-10-CM | POA: Insufficient documentation

## 2013-04-13 DIAGNOSIS — E785 Hyperlipidemia, unspecified: Secondary | ICD-10-CM | POA: Insufficient documentation

## 2013-04-13 DIAGNOSIS — I10 Essential (primary) hypertension: Secondary | ICD-10-CM | POA: Insufficient documentation

## 2013-04-13 DIAGNOSIS — Z85118 Personal history of other malignant neoplasm of bronchus and lung: Secondary | ICD-10-CM | POA: Insufficient documentation

## 2013-04-13 LAB — CBC WITH DIFFERENTIAL/PLATELET
Basophils Absolute: 0 10*3/uL (ref 0.0–0.1)
Basophils Relative: 0 % (ref 0–1)
Lymphocytes Relative: 9 % — ABNORMAL LOW (ref 12–46)
MCHC: 33 g/dL (ref 30.0–36.0)
Neutro Abs: 6.8 10*3/uL (ref 1.7–7.7)
Neutrophils Relative %: 80 % — ABNORMAL HIGH (ref 43–77)
Platelets: 183 10*3/uL (ref 150–400)
RDW: 15.5 % (ref 11.5–15.5)
WBC: 8.6 10*3/uL (ref 4.0–10.5)

## 2013-04-13 LAB — COMPREHENSIVE METABOLIC PANEL
ALT: 35 U/L (ref 0–53)
AST: 25 U/L (ref 0–37)
Albumin: 2.9 g/dL — ABNORMAL LOW (ref 3.5–5.2)
CO2: 24 mEq/L (ref 19–32)
Chloride: 102 mEq/L (ref 96–112)
GFR calc non Af Amer: 43 mL/min — ABNORMAL LOW (ref >90)
Sodium: 136 mEq/L (ref 135–145)
Total Bilirubin: 0.4 mg/dL (ref 0.3–1.2)

## 2013-04-13 MED ORDER — FUROSEMIDE 10 MG/ML IJ SOLN
20.0000 mg | Freq: Once | INTRAMUSCULAR | Status: AC
  Administered 2013-04-13: 20 mg via INTRAVENOUS
  Filled 2013-04-13: qty 4

## 2013-04-13 NOTE — ED Notes (Signed)
Brought in by EMS from [facility] facility with c/o bilateral leg swelling.  Per EMS, pt reported that he stopped taking Lasix a week ago without his PCP's order--- reported that he quit taking it because he does not like to use bathroom a lot especially at night.

## 2013-04-13 NOTE — ED Notes (Signed)
Bed: WA10 Expected date: 04/13/13 Expected time: 2:21 AM Means of arrival: Ambulance Comments: Lower extremity swelling

## 2013-04-13 NOTE — ED Provider Notes (Signed)
CSN: [id number]     Arrival date & time 04/13/13  4098 History   First MD Initiated Contact with Patient 04/13/13 [phone]     Chief Complaint  Patient presents with  . Leg Swelling   (Consider location/radiation/quality/duration/timing/severity/associated sxs/prior Treatment) HPI Comments: Patient is a 79 year old male who lives in an assisted living facility. He was sent here this evening with complaints of increased lower leg swelling. Patient stopped taking his Lasix one week ago as he was having some issues with urinary continence. He denies any chest pain or any shortness of breath. He denies any fevers or chills. Symptoms are aggravated by stopping his Lasix and improved when he takes it.  The history is provided by the patient.    Past Medical History  Diagnosis Date  . Hyperlipidemia   . Hypertension   . Dizziness   . Blood clot in vein   . Arthritis   . Peripheral arterial disease   . Carotid artery occlusion   . Ocular myasthenia gravis   . BPH (benign prostatic hyperplasia)   . Nephrolithiasis   . Low back pain   . Gait disorder   . Hx of radiation therapy 07/06/12, 07/08/12, 07/13/12    left upper lung nodule, 3 fractions  . Lung cancer 05/2012    left  . Hearing difficulty   . Lumbago    Past Surgical History  Procedure Laterality Date  . Appendectomy    . Cataract extraction      bilateral  . Retinal detachment surgery    . Thyroidectomy, partial     Family History  Problem Relation Age of Onset  . Cancer Mother   . Pneumonia Father   . Stroke Sister   . Hypertension Sister   . Heart disease Sister    History  Substance Use Topics  . Smoking status: Former Smoker -- 1.50 packs/day for 40 years    Types: Cigarettes  . Smokeless tobacco: Former Neurosurgeon    Quit date: 08/18/1970  . Alcohol Use: No    Review of Systems  All other systems reviewed and are negative.    Allergies  Review of patient's allergies indicates no known allergies.  Home  Medications   Current Outpatient Rx  Name  Route  Sig  Dispense  Refill  . amLODipine (NORVASC) 2.5 MG tablet   Oral   Take 2.5 mg by mouth every morning.          Marland Kitchen aspirin 81 MG chewable tablet   Oral   Chew 81 mg by mouth daily.         . calcium carbonate (OS-CAL) 600 MG TABS   Oral   Take 600 mg by mouth daily with lunch.         . hydrochlorothiazide (HYDRODIURIL) 25 MG tablet   Oral   Take 12.5 mg by mouth every morning.          . Multiple Vitamin (MULTIVITAMIN WITH MINERALS) TABS   Oral   Take 1 tablet by mouth every morning.          . predniSONE (DELTASONE) 5 MG tablet   Oral   Take 1 tablet (5 mg total) by mouth 3 (three) times daily.   90 tablet   6   . pyridostigmine (MESTINON) 60 MG tablet   Oral   Take 30 mg by mouth 3 (three) times daily.          . simvastatin (ZOCOR) 40 MG tablet   Oral  Take 40 mg by mouth at bedtime.           . Tamsulosin HCl (FLOMAX) 0.4 MG CAPS   Oral   Take 0.4 mg by mouth at bedtime.         . Vitamin D, Ergocalciferol, (DRISDOL) 50000 UNITS CAPS   Oral   Take 50,000 Units by mouth every 7 (seven) days. On Sundays          BP 180/65  Pulse 80  Temp(Src) 98.3 F (36.8 C) (Oral)  Resp 18  SpO2 97% Physical Exam  Nursing note and vitals reviewed. Constitutional: He is oriented to person, place, and time. He appears well-developed and well-nourished. No distress.  HENT:  Head: Normocephalic and atraumatic.  Mouth/Throat: Oropharynx is clear and moist.  Neck: Normal range of motion. Neck supple.  Cardiovascular: Normal rate, regular rhythm and normal heart sounds.   No murmur heard. Pulmonary/Chest: Effort normal and breath sounds normal. No respiratory distress. He has no wheezes.  Abdominal: Soft. Bowel sounds are normal.  Musculoskeletal: Normal range of motion.  There is 3+ pitting edema of the bilateral lower extremities. There is no redness or erythema. There is no calf tenderness. Homans  sign is absent bilaterally.  Neurological: He is alert and oriented to person, place, and time.  Skin: Skin is warm and dry. He is not diaphoretic.    ED Course  Procedures (including critical care time) Labs Review Labs Reviewed  CBC WITH DIFFERENTIAL - Abnormal; Notable for the following:    RBC 3.30 (*)    Hemoglobin 10.0 (*)    HCT 30.3 (*)    Neutrophils Relative % 80 (*)    Lymphocytes Relative 9 (*)    All other components within normal limits  COMPREHENSIVE METABOLIC PANEL - Abnormal; Notable for the following:    Glucose, Bld 227 (*)    BUN 34 (*)    Total Protein 5.5 (*)    Albumin 2.9 (*)    GFR calc non Af Amer 43 (*)    GFR calc Af Amer 50 (*)    All other components within normal limits   Imaging Review No results found.  MDM  No diagnosis found. This patient presents with increased edema after stopping his Lasix one week ago. Renal function is normal and the patient does not appear clinically to be in heart failure. No further workup seems necessary with the exception of restarting the patient's Lasix. He was given 20 of IV Lasix with good output. He will be discharged to followup as needed if he worsens.    Geoffery Lyons, MD 04/13/13 [phone]

## 2013-04-25 ENCOUNTER — Encounter: Payer: Self-pay | Admitting: Radiation Oncology

## 2013-04-25 ENCOUNTER — Ambulatory Visit
Admission: RE | Admit: 2013-04-25 | Discharge: 2013-04-25 | Disposition: A | Discharge status: Home / Self Care | Payer: Medicare Other | Source: Ambulatory Visit | Attending: Radiation Oncology | Admitting: Radiation Oncology

## 2013-04-25 NOTE — Progress Notes (Signed)
Marvin [name] here for follow up with his friend after treatment to his left lung.  He is using a walker.  He denies pain, shortness of breath and a cough.  He did have a nose bleed last night.  He reports that it was a small amount of blood when he blew his nose.  He dose not usually have nose bleeds.

## 2013-04-25 NOTE — Progress Notes (Signed)
  Radiation Oncology         [phone] ________________________________  Name: Marvin [name] MRN: [id number]  Date: 04/25/2013  DOB: 04/25/1914  Follow-Up Visit Note  CC: [name], MD  [name], MD  Diagnosis:   Clinical stage I non-small cell lung cancer  Interval Since Last Radiation:  9  months  Narrative:  The patient returns today for routine follow-up.  He seems to be doing reasonably well. He denies any pain in the chest area cough or hemoptysis. He was admitted recently for rectal bleeding. Last chest CT scan approximately 6 months ago showed shrinkage of his tumor in the left upper lung region                              ALLERGIES:  has No Known Allergies.  Meds: Current Outpatient Prescriptions  Medication Sig Dispense Refill  . amLODipine (NORVASC) 2.5 MG tablet Take 2.5 mg by mouth every morning.       Marland Kitchen aspirin 81 MG chewable tablet Chew 81 mg by mouth daily.      . calcium carbonate (OS-CAL) 600 MG TABS Take 600 mg by mouth daily with lunch.      . furosemide (LASIX) 20 MG tablet Take 20 mg by mouth daily. Takes 40 mg at night as needed      . hydrochlorothiazide (HYDRODIURIL) 25 MG tablet Take 12.5 mg by mouth every morning.       . Multiple Vitamin (MULTIVITAMIN WITH MINERALS) TABS Take 1 tablet by mouth every morning.       . predniSONE (DELTASONE) 5 MG tablet Take 1 tablet (5 mg total) by mouth 3 (three) times daily.  90 tablet  6  . pyridostigmine (MESTINON) 60 MG tablet Take 30 mg by mouth 3 (three) times daily.       . simvastatin (ZOCOR) 40 MG tablet Take 40 mg by mouth at bedtime.        . Tamsulosin HCl (FLOMAX) 0.4 MG CAPS Take 0.4 mg by mouth at bedtime.      . Vitamin D, Ergocalciferol, (DRISDOL) 50000 UNITS CAPS Take 50,000 Units by mouth every 7 (seven) days. On Sundays       No current facility-administered medications for this encounter.    Physical Findings: The patient is in no acute distress. Patient is alert and oriented.  height is 5\' 7"  (1.702 m) and weight is 190 lb (86.183 kg). His temperature is 97.4 F (36.3 C). His blood pressure is 166/78 and his pulse is 82. His oxygen saturation is 95%. .  No palpable supraclavicular or axillary adenopathy. The lungs are clear to auscultation. The heart has a regular rhythm and rate.  Lab Findings: Lab Results  Component Value Date   WBC 8.6 04/13/2013   HGB 10.0* 04/13/2013   HCT 30.3* 04/13/2013   MCV 91.8 04/13/2013   PLT 183 04/13/2013      Radiographic Findings: No results found.  Impression:  The patient is recovering from the effects of radiation.  No evidence of recurrence on clinical exam today  Plan:  Routine followup in 6 months. Patient will be scheduled for a repeat CT scan of the chest in the next few days to followup on his lung cancer.  _____________________________________  -----------------------------------  Billie Lade, PhD, MD

## 2013-04-26 ENCOUNTER — Telehealth: Payer: Self-pay | Admitting: *Deleted

## 2013-04-26 NOTE — Telephone Encounter (Signed)
Called patient to inform of test, no answer will call later.

## 2013-04-27 ENCOUNTER — Telehealth: Payer: Self-pay | Admitting: *Deleted

## 2013-04-27 NOTE — Telephone Encounter (Signed)
Called patient to inform of test for 04-28-13, spoke with patient and he is aware of this test

## 2013-04-28 ENCOUNTER — Ambulatory Visit (HOSPITAL_COMMUNITY)
Admission: RE | Admit: 2013-04-28 | Discharge: 2013-04-28 | Disposition: A | Discharge status: Home / Self Care | Payer: Medicare Other | Source: Ambulatory Visit | Attending: Radiation Oncology | Admitting: Radiation Oncology

## 2013-04-28 ENCOUNTER — Encounter (HOSPITAL_COMMUNITY): Payer: Self-pay

## 2013-04-28 DIAGNOSIS — I2510 Atherosclerotic heart disease of native coronary artery without angina pectoris: Secondary | ICD-10-CM | POA: Insufficient documentation

## 2013-04-28 DIAGNOSIS — N289 Disorder of kidney and ureter, unspecified: Secondary | ICD-10-CM | POA: Insufficient documentation

## 2013-04-28 DIAGNOSIS — Z923 Personal history of irradiation: Secondary | ICD-10-CM | POA: Insufficient documentation

## 2013-04-28 DIAGNOSIS — E049 Nontoxic goiter, unspecified: Secondary | ICD-10-CM | POA: Insufficient documentation

## 2013-04-28 DIAGNOSIS — C3410 Malignant neoplasm of upper lobe, unspecified bronchus or lung: Secondary | ICD-10-CM | POA: Insufficient documentation

## 2013-04-28 DIAGNOSIS — R911 Solitary pulmonary nodule: Secondary | ICD-10-CM | POA: Insufficient documentation

## 2013-04-28 DIAGNOSIS — I700 Atherosclerosis of aorta: Secondary | ICD-10-CM | POA: Insufficient documentation

## 2013-05-02 ENCOUNTER — Telehealth: Payer: Self-pay | Admitting: Oncology

## 2013-05-02 NOTE — Telephone Encounter (Signed)
Called [facility].  Told him that the results from his CT of his chest on 04/28/2013 were good.  He was glad and was wondering if he would need further treatment.  Advised him that Dr. Roselind Messier would be notified.

## 2013-05-09 ENCOUNTER — Ambulatory Visit (INDEPENDENT_AMBULATORY_CARE_PROVIDER_SITE_OTHER): Payer: Medicare Other | Admitting: Neurology

## 2013-05-09 ENCOUNTER — Encounter: Payer: Self-pay | Admitting: Neurology

## 2013-05-09 VITALS — BP 164/80 | HR 74 | Ht 67.0 in | Wt 185.0 lb

## 2013-05-09 DIAGNOSIS — R269 Unspecified abnormalities of gait and mobility: Secondary | ICD-10-CM

## 2013-05-09 DIAGNOSIS — G7000 Myasthenia gravis without (acute) exacerbation: Secondary | ICD-10-CM

## 2013-05-09 DIAGNOSIS — D518 Other vitamin B12 deficiency anemias: Secondary | ICD-10-CM

## 2013-05-09 HISTORY — DX: Unspecified abnormalities of gait and mobility: R26.9

## 2013-05-09 NOTE — Patient Instructions (Signed)
Use the walker at all times for gait stability.

## 2013-05-09 NOTE — Progress Notes (Signed)
Reason for visit: Myasthenia gravis  Marvin [name] is an 79 y.o. male  History of present illness:   Marvin [name] is a 79 year old right-handed white male with a history of myasthenia gravis with primarily ocular features. The patient indicates that his vision issues are stable. The patient will occasionally have some double vision while watching TV, or reading a newspaper. The patient does not operate a motor vehicle, and he indicates that he stopped driving because of the double vision. The patient is residing in an assisted living situation. The patient has had some issues with gait instability, and he had a fall in June of 2014. The patient indicates that his family got him a rolling walker, and he has not had any falls while using a walker. The patient has significant peripheral edema, and he takes Lasix for this. The patient denies any problems with breathing, chewing, or swallowing. The patient has reported no change in strength of the extremities. The patient does have a lot of difficulty getting up out of a chair, and he generally does not go up or down stairs. The patient returns for an evaluation. The patient denies any new medical issues since last seen.  Past Medical History  Diagnosis Date  . Hyperlipidemia   . Hypertension   . Dizziness   . Blood clot in vein   . Arthritis   . Peripheral arterial disease   . Carotid artery occlusion   . Ocular myasthenia gravis   . BPH (benign prostatic hyperplasia)   . Nephrolithiasis   . Low back pain   . Gait disorder   . Hx of radiation therapy 07/06/12, 07/08/12, 07/13/12    left upper lung nodule, 3 fractions  . Lung cancer 05/2012    left  . Hearing difficulty   . Lumbago   . Abnormality of gait 05/09/2013  . Hearing deficit     right hearing aid  . Peripheral edema     Past Surgical History  Procedure Laterality Date  . Appendectomy    . Cataract extraction      bilateral  . Retinal detachment surgery    .  Thyroidectomy, partial      Family History  Problem Relation Age of Onset  . Cancer Mother   . Pneumonia Father   . Stroke Sister   . Hypertension Sister   . Heart disease Sister     Social history:  reports that he has quit smoking. His smoking use included Cigarettes. He has a 60 pack-year smoking history. He quit smokeless tobacco use about 42 years ago. He reports that he does not drink alcohol or use illicit drugs.   No Known Allergies  Medications:  Current Outpatient Prescriptions on File Prior to Visit  Medication Sig Dispense Refill  . amLODipine (NORVASC) 2.5 MG tablet Take 2.5 mg by mouth every morning.       Marland Kitchen aspirin 81 MG chewable tablet Chew 81 mg by mouth daily.      . calcium carbonate (OS-CAL) 600 MG TABS Take 600 mg by mouth daily with lunch.      . furosemide (LASIX) 20 MG tablet Take 20 mg by mouth daily. Takes 40 mg at night as needed      . hydrochlorothiazide (HYDRODIURIL) 25 MG tablet Take 12.5 mg by mouth every morning.       . Multiple Vitamin (MULTIVITAMIN WITH MINERALS) TABS Take 1 tablet by mouth every morning.       . predniSONE (DELTASONE) 5  MG tablet Take 1 tablet (5 mg total) by mouth 3 (three) times daily.  90 tablet  6  . pyridostigmine (MESTINON) 60 MG tablet Take 30 mg by mouth 3 (three) times daily.       . simvastatin (ZOCOR) 40 MG tablet Take 40 mg by mouth at bedtime.        . Tamsulosin HCl (FLOMAX) 0.4 MG CAPS Take 0.4 mg by mouth at bedtime.      . Vitamin D, Ergocalciferol, (DRISDOL) 50000 UNITS CAPS Take 50,000 Units by mouth every 7 (seven) days. On Sundays       No current facility-administered medications on file prior to visit.    ROS:  Out of a complete 14 system review of symptoms, the patient complains only of the following symptoms, and all other reviewed systems are negative.  Swelling in the legs Hearing loss Blurred vision, double vision Blood in the stool, incontinence, constipation Urination problems, urinary  incontinence, impotence Easy bruising Disinterest in activities  Blood pressure 164/80, pulse 74, height 5\' 7"  (1.702 m), weight 185 lb (83.915 kg).  Physical Exam  General: The patient is alert and cooperative at the time of the examination.  Skin: 2-3+ edema below the knees is noted.   Neurologic Exam  Cranial nerves: Facial symmetry is present. Speech is normal, no aphasia or dysarthria is noted. Extraocular movements are full. Visual fields are full. With superior gaze for 1 minute, there is no observable Dopplers days, no reported subjective diplopia, and no ptosis.  Motor: The patient has good strength in all 4 extremities, with exception that there may be some mild proximal weakness in the legs. With abduction of the arms for 1 minute, there is no fatigable weakness of the deltoid muscles on either side  Coordination: The patient has good finger-nose-finger and heel-to-shin bilaterally.  Gait and station: The patient requires assistance with standing. The patient uses a walker for ambulation. The patient has a wide-based, unsteady gait otherwise. Tandem gait was not attempted. Romberg is positive, the patient falls backwards.  Reflexes: Deep tendon reflexes are symmetric, but are depressed.   Assessment/Plan:  1. Myasthenia gravis with ocular features  2. Gait instability  3. History of lung cancer  The patient has had a change in his balance. The patient will be set up for blood work today primary looking for low B12 levels. The patient will use a walker for ambulation. The patient has not had any changes in his visual disturbance, and he indicates that this is not a big issue for him. We will not readjust the medications for myasthenia gravis. The Mestinon can be increased in the future if needed. The patient followup in 6 months. If the gait disturbance continues to progress, CT or MRI of the brain will need to be done.   Marvin Palau MD 05/09/2013 9:52 PM  [facility] [address] [place], [place] [address]  Phone [phone] Fax [phone]

## 2013-05-12 ENCOUNTER — Telehealth: Payer: Self-pay

## 2013-05-12 NOTE — Telephone Encounter (Signed)
Message copied by [facility] on Thu May 12, 2013 12:55 PM ------      Message from: Stephanie Acre      Created: Wed May 11, 2013  4:42 PM                   ----- Message -----         From: [facility] Lab Results In Interface         Sent: 05/11/2013   4:40 PM           To: York Spaniel, MD             ------

## 2013-05-12 NOTE — Telephone Encounter (Signed)
I called and let patient know labs were within normal limits.

## 2013-06-04 ENCOUNTER — Emergency Department (HOSPITAL_COMMUNITY)
Admission: EM | Admit: 2013-06-04 | Discharge: 2013-06-04 | Disposition: A | Discharge status: Home / Self Care | Payer: Medicare Other | Attending: Emergency Medicine | Admitting: Emergency Medicine

## 2013-06-04 ENCOUNTER — Emergency Department (HOSPITAL_COMMUNITY): Payer: Medicare Other

## 2013-06-04 ENCOUNTER — Encounter (HOSPITAL_COMMUNITY): Payer: Self-pay | Admitting: Emergency Medicine

## 2013-06-04 DIAGNOSIS — IMO0002 Reserved for concepts with insufficient information to code with codable children: Secondary | ICD-10-CM | POA: Insufficient documentation

## 2013-06-04 DIAGNOSIS — Z87448 Personal history of other diseases of urinary system: Secondary | ICD-10-CM | POA: Insufficient documentation

## 2013-06-04 DIAGNOSIS — Z86718 Personal history of other venous thrombosis and embolism: Secondary | ICD-10-CM | POA: Insufficient documentation

## 2013-06-04 DIAGNOSIS — Z8669 Personal history of other diseases of the nervous system and sense organs: Secondary | ICD-10-CM | POA: Insufficient documentation

## 2013-06-04 DIAGNOSIS — W1809XA Striking against other object with subsequent fall, initial encounter: Secondary | ICD-10-CM | POA: Insufficient documentation

## 2013-06-04 DIAGNOSIS — M129 Arthropathy, unspecified: Secondary | ICD-10-CM | POA: Insufficient documentation

## 2013-06-04 DIAGNOSIS — S51009A Unspecified open wound of unspecified elbow, initial encounter: Secondary | ICD-10-CM | POA: Insufficient documentation

## 2013-06-04 DIAGNOSIS — Z79899 Other long term (current) drug therapy: Secondary | ICD-10-CM | POA: Insufficient documentation

## 2013-06-04 DIAGNOSIS — S40019A Contusion of unspecified shoulder, initial encounter: Secondary | ICD-10-CM | POA: Insufficient documentation

## 2013-06-04 DIAGNOSIS — S51019A Laceration without foreign body of unspecified elbow, initial encounter: Secondary | ICD-10-CM

## 2013-06-04 DIAGNOSIS — Y929 Unspecified place or not applicable: Secondary | ICD-10-CM | POA: Insufficient documentation

## 2013-06-04 DIAGNOSIS — Z923 Personal history of irradiation: Secondary | ICD-10-CM | POA: Insufficient documentation

## 2013-06-04 DIAGNOSIS — S40011A Contusion of right shoulder, initial encounter: Secondary | ICD-10-CM

## 2013-06-04 DIAGNOSIS — I10 Essential (primary) hypertension: Secondary | ICD-10-CM | POA: Insufficient documentation

## 2013-06-04 DIAGNOSIS — Y9389 Activity, other specified: Secondary | ICD-10-CM | POA: Insufficient documentation

## 2013-06-04 DIAGNOSIS — Z7982 Long term (current) use of aspirin: Secondary | ICD-10-CM | POA: Insufficient documentation

## 2013-06-04 DIAGNOSIS — Z87891 Personal history of nicotine dependence: Secondary | ICD-10-CM | POA: Insufficient documentation

## 2013-06-04 DIAGNOSIS — Z87442 Personal history of urinary calculi: Secondary | ICD-10-CM | POA: Insufficient documentation

## 2013-06-04 DIAGNOSIS — Z85118 Personal history of other malignant neoplasm of bronchus and lung: Secondary | ICD-10-CM | POA: Insufficient documentation

## 2013-06-04 DIAGNOSIS — E785 Hyperlipidemia, unspecified: Secondary | ICD-10-CM | POA: Insufficient documentation

## 2013-06-04 MED ORDER — OXYCODONE-ACETAMINOPHEN 5-325 MG PO TABS: 1.0000 | ORAL_TABLET | Freq: Once | ORAL | Status: DC

## 2013-06-04 MED ORDER — OXYCODONE-ACETAMINOPHEN 5-325 MG PO TABS: 1.0000 | ORAL_TABLET | ORAL | Status: DC | PRN

## 2013-06-04 NOTE — ED Notes (Signed)
Bed: WA01 Expected date: 06/04/13 Expected time: 6:46 PM Means of arrival: Ambulance Comments: Fall shoulder injury

## 2013-06-04 NOTE — ED Provider Notes (Signed)
CSN: [id number]     Arrival date & time 06/04/13  6962 History   First MD Initiated Contact with Patient 06/04/13 1918     Chief Complaint  Patient presents with  . Fall  . Shoulder Pain   (Consider location/radiation/quality/duration/timing/severity/associated sxs/prior Treatment) HPI Comments: Patient here after falling while reaching for a table to steady himself.  He reports that he usually has an unsteady gait and uses a walker to help walk.  He states that even then he will use other furniture to steady himself.  He states that the table fell away and he then struck his right shoulder on the table.  He has skin tears to both elbows, and slight abrasion to right eyebrow without bleeding.  He denies LOC, neck pain, reports pain with movement of the shoulder and upper arm.    Patient is a 79 y.o. male presenting with fall and shoulder pain. The history is provided by the patient. No language interpreter was used.  Fall This is a new problem. The current episode started today. The problem occurs rarely. The problem has been unchanged. Associated symptoms include arthralgias. Pertinent negatives include no abdominal pain, chest pain, congestion, coughing, headaches, myalgias, neck pain, swollen glands, urinary symptoms, vomiting or weakness. Nothing aggravates the symptoms. He has tried nothing for the symptoms. The treatment provided no relief.  Shoulder Pain Associated symptoms include arthralgias. Pertinent negatives include no abdominal pain, chest pain, congestion, coughing, headaches, myalgias, neck pain, swollen glands, urinary symptoms, vomiting or weakness.    Past Medical History  Diagnosis Date  . Hyperlipidemia   . Hypertension   . Dizziness   . Blood clot in vein   . Arthritis   . Peripheral arterial disease   . Carotid artery occlusion   . Ocular myasthenia gravis   . BPH (benign prostatic hyperplasia)   . Nephrolithiasis   . Low back pain   . Gait disorder   . Hx of  radiation therapy 07/06/12, 07/08/12, 07/13/12    left upper lung nodule, 3 fractions  . Lung cancer 05/2012    left  . Hearing difficulty   . Lumbago   . Abnormality of gait 05/09/2013  . Hearing deficit     right hearing aid  . Peripheral edema    Past Surgical History  Procedure Laterality Date  . Appendectomy    . Cataract extraction      bilateral  . Retinal detachment surgery    . Thyroidectomy, partial     Family History  Problem Relation Age of Onset  . Cancer Mother   . Pneumonia Father   . Stroke Sister   . Hypertension Sister   . Heart disease Sister    History  Substance Use Topics  . Smoking status: Former Smoker -- 1.50 packs/day for 40 years    Types: Cigarettes  . Smokeless tobacco: Former Neurosurgeon    Quit date: 08/18/1970     Comment: Quit in 1958  . Alcohol Use: No    Review of Systems  HENT: Negative for congestion.   Respiratory: Negative for cough.   Cardiovascular: Negative for chest pain.  Gastrointestinal: Negative for vomiting and abdominal pain.  Musculoskeletal: Positive for arthralgias. Negative for myalgias and neck pain.  Neurological: Negative for weakness and headaches.  All other systems reviewed and are negative.    Allergies  Hydrocodone  Home Medications   Current Outpatient Rx  Name  Route  Sig  Dispense  Refill  . amLODipine (NORVASC) 2.5 MG  tablet   Oral   Take 2.5 mg by mouth every morning.          Marland Kitchen aspirin 81 MG chewable tablet   Oral   Chew 81 mg by mouth daily.         . calcium carbonate (OS-CAL) 600 MG TABS   Oral   Take 600 mg by mouth daily with lunch.         . furosemide (LASIX) 20 MG tablet   Oral   Take 20 mg by mouth daily. Takes 40 mg at night as needed         . hydrochlorothiazide (HYDRODIURIL) 25 MG tablet   Oral   Take 12.5 mg by mouth every morning.          . Multiple Vitamin (MULTIVITAMIN WITH MINERALS) TABS   Oral   Take 1 tablet by mouth every morning.          .  predniSONE (DELTASONE) 5 MG tablet   Oral   Take 1 tablet (5 mg total) by mouth 3 (three) times daily.   90 tablet   6   . pyridostigmine (MESTINON) 60 MG tablet   Oral   Take 30 mg by mouth 3 (three) times daily.          . simvastatin (ZOCOR) 40 MG tablet   Oral   Take 40 mg by mouth at bedtime.           . Tamsulosin HCl (FLOMAX) 0.4 MG CAPS   Oral   Take 0.4 mg by mouth at bedtime.         . Vitamin D, Ergocalciferol, (DRISDOL) 50000 UNITS CAPS   Oral   Take 50,000 Units by mouth every 7 (seven) days. On Sundays          BP 145/57  Pulse 52  Temp(Src) 97.8 F (36.6 C) (Oral)  Resp 14  SpO2 97% Physical Exam  Nursing note and vitals reviewed. Constitutional: He is oriented to person, place, and time. He appears well-developed and well-nourished. No distress.  HENT:  Head: Normocephalic and atraumatic.  Right Ear: External ear normal.  Left Ear: External ear normal.  Nose: Nose normal.  Mouth/Throat: Oropharynx is clear and moist. No oropharyngeal exudate.  Eyes: Conjunctivae are normal. Pupils are equal, round, and reactive to light. No scleral icterus.  Neck: Normal range of motion. Neck supple. No spinous process tenderness and no muscular tenderness present.  Cardiovascular: Normal rate, regular rhythm and normal heart sounds.  Exam reveals no gallop and no friction rub.   No murmur heard. Pulmonary/Chest: Effort normal and breath sounds normal. No respiratory distress. He has no wheezes. He has no rales. He exhibits no tenderness.  Abdominal: Soft. Bowel sounds are normal. He exhibits no distension. There is no tenderness.  Musculoskeletal:       Right shoulder: He exhibits decreased range of motion, tenderness, bony tenderness and pain. He exhibits no deformity.       Right upper arm: He exhibits tenderness and bony tenderness. He exhibits no swelling and no edema.       Arms: Lymphadenopathy:    He has no cervical adenopathy.  Neurological: He is  alert and oriented to person, place, and time. He exhibits normal muscle tone. Coordination normal.  Skin: Skin is warm and dry. No rash noted. No erythema. No pallor.  Psychiatric: He has a normal mood and affect. His behavior is normal. Judgment and thought content normal.    ED  Course  Procedures (including critical care time) Labs Review Labs Reviewed - No data to display Imaging Review No results found.  EKG Interpretation   None      Results for orders placed in visit on 05/09/13  VITAMIN B12      Result Value Range   Vitamin B-12 568  211 - 946 pg/mL  METHYLMALONIC ACID, SERUM      Result Value Range   Methylmalonic Acid 248  0 - 378 nmol/L   Dg Shoulder Right  06/04/2013   CLINICAL DATA:  Fall, shoulder pain.  EXAM: RIGHT SHOULDER - 2+ VIEW  COMPARISON:  None.  FINDINGS: Degenerative changes in the right [facility] joint. Glenohumeral joint is intact. No fracture, subluxation or dislocation.  IMPRESSION: No acute findings.   Electronically Signed   By: Charlett Nose M.D.   On: 06/04/2013 20:24   Dg Humerus Right  06/04/2013   CLINICAL DATA:  Fall, shoulder pain.  EXAM: RIGHT HUMERUS - 2+ VIEW  COMPARISON:  None.  FINDINGS: No acute bony abnormality. Specifically, no fracture, subluxation, or dislocation. Soft tissues are intact. Degenerative changes in the right shoulder.  IMPRESSION: No acute bony abnormality.   Electronically Signed   By: Charlett Nose M.D.   On: 06/04/2013 20:27     MDM  Right shoulder contusion Multiple skin tears  Patient here s/p fall - no fractures noted on x-ray - patient is very appropriate mental status wise and denies any LOC or headache.  I will not place him in a sling at this time because of the limited range of motion.   Izola Price Marisue Humble, PA-C 06/04/13 2141

## 2013-06-04 NOTE — ED Notes (Signed)
[facility] called for transportation to [facility]

## 2013-06-04 NOTE — ED Notes (Signed)
Per EMS: Pt from [facility], lives in independent living. Pt ambulatory with walker. Pt fell while reaching out to grab table. Pt fell on bottom. Denies neck or back pain. Limited range of motion to right shoulder, pain to lateral clavicle area. Small laceration to R forehead; denies headache or LOC. Pt not on blood thinners.

## 2013-06-05 NOTE — ED Provider Notes (Signed)
Medical screening examination/treatment/procedure(s) were conducted as a shared visit with non-physician practitioner(s) and myself.  I personally evaluated the patient during the encounter Pt with minor fall, some pain to right shoulder, but no deformity noted.  Small abrasion to right eyebrow, no hematoma or suggestion of head injury.  No pain on ROM of hips/lower extremitiies.  Rolan Bucco, MD 06/05/13 [id number]

## 2013-06-07 ENCOUNTER — Telehealth: Payer: Self-pay | Admitting: Oncology

## 2013-06-07 NOTE — Telephone Encounter (Signed)
Called [facility]. There was no answer.

## 2013-06-08 ENCOUNTER — Telehealth: Payer: Self-pay | Admitting: Oncology

## 2013-06-08 NOTE — Telephone Encounter (Signed)
Talked to [facility] and explained that he would not need further radiation treatment just close follow up per Dr. Roselind Messier.  He had a question when given his CT results about whether he would need further treatment.

## 2013-06-10 ENCOUNTER — Encounter: Payer: Self-pay | Admitting: Podiatry

## 2013-06-10 ENCOUNTER — Ambulatory Visit (INDEPENDENT_AMBULATORY_CARE_PROVIDER_SITE_OTHER): Payer: Medicare Other | Admitting: Podiatry

## 2013-06-10 VITALS — BP 167/67 | HR 68 | Ht 67.0 in | Wt 186.0 lb

## 2013-06-10 DIAGNOSIS — M25579 Pain in unspecified ankle and joints of unspecified foot: Secondary | ICD-10-CM

## 2013-06-10 DIAGNOSIS — R600 Localized edema: Secondary | ICD-10-CM

## 2013-06-10 DIAGNOSIS — R609 Edema, unspecified: Secondary | ICD-10-CM

## 2013-06-10 DIAGNOSIS — B351 Tinea unguium: Secondary | ICD-10-CM

## 2013-06-10 NOTE — Patient Instructions (Signed)
Seen for elongated toe nails. Noted of excess edema on foot, ankle and leg. Keep compression socks daily. All nails debrided. Return in 3 months or as needed.

## 2013-06-10 NOTE — Progress Notes (Signed)
Subjective:  79 year old male presents with his son requesting toe nails trimmed. Walking with help of wheeled walker. Patient is not wearing compression socks today.  Objective:  Severe pedal edema bilateral. Mycotic nails x 10. Pedal pulses are not palpable due to excess fluid.   Assessment: Hypertrophic nails x 10.  Pedal edema bilateral.  PVD.   Treatment: All nails debrided.  Return in 3 months or as needed.

## 2013-06-23 ENCOUNTER — Other Ambulatory Visit: Payer: Self-pay

## 2013-07-30 ENCOUNTER — Inpatient Hospital Stay (HOSPITAL_COMMUNITY)
Admission: EM | Admit: 2013-07-30 | Discharge: 2013-08-04 | DRG: 602 | Disposition: A | Discharge status: Skilled Nursing Facility | Payer: Medicare Other | Attending: Internal Medicine | Admitting: Internal Medicine

## 2013-07-30 ENCOUNTER — Emergency Department (HOSPITAL_COMMUNITY): Payer: Medicare Other

## 2013-07-30 ENCOUNTER — Encounter (HOSPITAL_COMMUNITY): Payer: Self-pay | Admitting: Emergency Medicine

## 2013-07-30 DIAGNOSIS — G7000 Myasthenia gravis without (acute) exacerbation: Secondary | ICD-10-CM | POA: Diagnosis present

## 2013-07-30 DIAGNOSIS — N400 Enlarged prostate without lower urinary tract symptoms: Secondary | ICD-10-CM | POA: Diagnosis present

## 2013-07-30 DIAGNOSIS — R4182 Altered mental status, unspecified: Secondary | ICD-10-CM | POA: Diagnosis present

## 2013-07-30 DIAGNOSIS — Z87442 Personal history of urinary calculi: Secondary | ICD-10-CM

## 2013-07-30 DIAGNOSIS — L0390 Cellulitis, unspecified: Secondary | ICD-10-CM

## 2013-07-30 DIAGNOSIS — D320 Benign neoplasm of cerebral meninges: Secondary | ICD-10-CM | POA: Diagnosis present

## 2013-07-30 DIAGNOSIS — I129 Hypertensive chronic kidney disease with stage 1 through stage 4 chronic kidney disease, or unspecified chronic kidney disease: Secondary | ICD-10-CM | POA: Diagnosis present

## 2013-07-30 DIAGNOSIS — E860 Dehydration: Secondary | ICD-10-CM | POA: Diagnosis present

## 2013-07-30 DIAGNOSIS — I10 Essential (primary) hypertension: Secondary | ICD-10-CM | POA: Diagnosis present

## 2013-07-30 DIAGNOSIS — H7090 Unspecified mastoiditis, unspecified ear: Secondary | ICD-10-CM | POA: Diagnosis present

## 2013-07-30 DIAGNOSIS — Z823 Family history of stroke: Secondary | ICD-10-CM

## 2013-07-30 DIAGNOSIS — G9340 Encephalopathy, unspecified: Secondary | ICD-10-CM | POA: Diagnosis present

## 2013-07-30 DIAGNOSIS — Z79899 Other long term (current) drug therapy: Secondary | ICD-10-CM

## 2013-07-30 DIAGNOSIS — IMO0002 Reserved for concepts with insufficient information to code with codable children: Secondary | ICD-10-CM

## 2013-07-30 DIAGNOSIS — Z8673 Personal history of transient ischemic attack (TIA), and cerebral infarction without residual deficits: Secondary | ICD-10-CM

## 2013-07-30 DIAGNOSIS — Z8249 Family history of ischemic heart disease and other diseases of the circulatory system: Secondary | ICD-10-CM

## 2013-07-30 DIAGNOSIS — L02419 Cutaneous abscess of limb, unspecified: Principal | ICD-10-CM | POA: Diagnosis present

## 2013-07-30 DIAGNOSIS — K625 Hemorrhage of anus and rectum: Secondary | ICD-10-CM

## 2013-07-30 DIAGNOSIS — E785 Hyperlipidemia, unspecified: Secondary | ICD-10-CM | POA: Diagnosis present

## 2013-07-30 DIAGNOSIS — N183 Chronic kidney disease, stage 3 unspecified: Secondary | ICD-10-CM | POA: Diagnosis present

## 2013-07-30 DIAGNOSIS — J9600 Acute respiratory failure, unspecified whether with hypoxia or hypercapnia: Secondary | ICD-10-CM | POA: Diagnosis present

## 2013-07-30 DIAGNOSIS — N189 Chronic kidney disease, unspecified: Secondary | ICD-10-CM | POA: Diagnosis present

## 2013-07-30 DIAGNOSIS — Z87891 Personal history of nicotine dependence: Secondary | ICD-10-CM

## 2013-07-30 DIAGNOSIS — Z923 Personal history of irradiation: Secondary | ICD-10-CM

## 2013-07-30 DIAGNOSIS — C3410 Malignant neoplasm of upper lobe, unspecified bronchus or lung: Secondary | ICD-10-CM | POA: Diagnosis present

## 2013-07-30 DIAGNOSIS — I739 Peripheral vascular disease, unspecified: Secondary | ICD-10-CM | POA: Diagnosis present

## 2013-07-30 DIAGNOSIS — J9601 Acute respiratory failure with hypoxia: Secondary | ICD-10-CM | POA: Diagnosis present

## 2013-07-30 DIAGNOSIS — I6529 Occlusion and stenosis of unspecified carotid artery: Secondary | ICD-10-CM

## 2013-07-30 LAB — COMPREHENSIVE METABOLIC PANEL
AST: 71 U/L — ABNORMAL HIGH (ref 0–37)
BUN: 47 mg/dL — ABNORMAL HIGH (ref 6–23)
CO2: 28 mEq/L (ref 19–32)
Calcium: 9.2 mg/dL (ref 8.4–10.5)
Chloride: 95 mEq/L — ABNORMAL LOW (ref 96–112)
Creatinine, Ser: 1.64 mg/dL — ABNORMAL HIGH (ref 0.50–1.35)
GFR calc Af Amer: 39 mL/min — ABNORMAL LOW (ref >90)
GFR calc non Af Amer: 33 mL/min — ABNORMAL LOW (ref >90)
Glucose, Bld: 115 mg/dL — ABNORMAL HIGH (ref 70–99)
Total Bilirubin: 1.1 mg/dL (ref 0.3–1.2)

## 2013-07-30 LAB — CBC WITH DIFFERENTIAL/PLATELET
Basophils Absolute: 0 10*3/uL (ref 0.0–0.1)
Eosinophils Relative: 1 % (ref 0–5)
HCT: 28.8 % — ABNORMAL LOW (ref 39.0–52.0)
Hemoglobin: 9.5 g/dL — ABNORMAL LOW (ref 13.0–17.0)
Lymphocytes Relative: 8 % — ABNORMAL LOW (ref 12–46)
MCHC: 33 g/dL (ref 30.0–36.0)
MCV: 93.5 fL (ref 78.0–100.0)
Monocytes Absolute: 0.6 10*3/uL (ref 0.1–1.0)
Monocytes Relative: 5 % (ref 3–12)
Neutro Abs: 9.9 10*3/uL — ABNORMAL HIGH (ref 1.7–7.7)
RDW: 15.4 % (ref 11.5–15.5)
WBC: 11.6 10*3/uL — ABNORMAL HIGH (ref 4.0–10.5)

## 2013-07-30 LAB — URINALYSIS, ROUTINE W REFLEX MICROSCOPIC
Glucose, UA: NEGATIVE mg/dL
Ketones, ur: NEGATIVE mg/dL
Leukocytes, UA: NEGATIVE
Nitrite: NEGATIVE
Protein, ur: NEGATIVE mg/dL
Specific Gravity, Urine: 1.009 (ref 1.005–1.030)
Urobilinogen, UA: 0.2 mg/dL (ref 0.0–1.0)
pH: 7 (ref 5.0–8.0)

## 2013-07-30 LAB — PRO B NATRIURETIC PEPTIDE: Pro B Natriuretic peptide (BNP): 1356 pg/mL — ABNORMAL HIGH (ref 0–450)

## 2013-07-30 LAB — URINE MICROSCOPIC-ADD ON

## 2013-07-30 LAB — POCT I-STAT TROPONIN I: Troponin i, poc: 0.04 ng/mL (ref 0.00–0.08)

## 2013-07-30 MED ORDER — HEPARIN SODIUM (PORCINE) 5000 UNIT/ML IJ SOLN
5000.0000 [IU] | Freq: Three times a day (TID) | INTRAMUSCULAR | Status: DC
  Administered 2013-07-30 – 2013-08-04 (×14): 5000 [IU] via SUBCUTANEOUS
  Filled 2013-07-30 (×18): qty 1

## 2013-07-30 MED ORDER — ONDANSETRON HCL 4 MG/2ML IJ SOLN: 4.0000 mg | Freq: Four times a day (QID) | INTRAMUSCULAR | Status: DC | PRN

## 2013-07-30 MED ORDER — CALCIUM CARBONATE 600 MG PO TABS
600.0000 mg | ORAL_TABLET | Freq: Every day | ORAL | Status: DC
  Filled 2013-07-30: qty 1

## 2013-07-30 MED ORDER — SODIUM CHLORIDE 0.9 % IJ SOLN
3.0000 mL | Freq: Two times a day (BID) | INTRAMUSCULAR | Status: DC
  Administered 2013-07-30 – 2013-08-03 (×3): 3 mL via INTRAVENOUS

## 2013-07-30 MED ORDER — SODIUM CHLORIDE 0.9 % IV SOLN
250.0000 mL | INTRAVENOUS | Status: DC | PRN
  Administered 2013-07-30 (×2): 250 mL via INTRAVENOUS

## 2013-07-30 MED ORDER — ONDANSETRON HCL 4 MG PO TABS: 4.0000 mg | ORAL_TABLET | Freq: Four times a day (QID) | ORAL | Status: DC | PRN

## 2013-07-30 MED ORDER — ACETAMINOPHEN 325 MG PO TABS
650.0000 mg | ORAL_TABLET | Freq: Four times a day (QID) | ORAL | Status: DC | PRN
  Administered 2013-07-31 – 2013-08-04 (×4): 650 mg via ORAL
  Filled 2013-07-30 (×4): qty 2

## 2013-07-30 MED ORDER — VANCOMYCIN HCL IN DEXTROSE 1-5 GM/200ML-% IV SOLN
1000.0000 mg | Freq: Two times a day (BID) | INTRAVENOUS | Status: DC
  Filled 2013-07-30: qty 200

## 2013-07-30 MED ORDER — VITAMIN D (ERGOCALCIFEROL) 1.25 MG (50000 UNIT) PO CAPS
50000.0000 [IU] | ORAL_CAPSULE | ORAL | Status: DC
  Administered 2013-07-31: 50000 [IU] via ORAL
  Filled 2013-07-30: qty 1

## 2013-07-30 MED ORDER — PREDNISONE 5 MG PO TABS
5.0000 mg | ORAL_TABLET | Freq: Three times a day (TID) | ORAL | Status: DC
  Administered 2013-07-31 – 2013-08-04 (×13): 5 mg via ORAL
  Filled 2013-07-30 (×16): qty 1

## 2013-07-30 MED ORDER — PIPERACILLIN-TAZOBACTAM 3.375 G IVPB
3.3750 g | Freq: Three times a day (TID) | INTRAVENOUS | Status: DC
  Administered 2013-07-30 – 2013-08-02 (×8): 3.375 g via INTRAVENOUS
  Filled 2013-07-30 (×11): qty 50

## 2013-07-30 MED ORDER — PYRIDOSTIGMINE BROMIDE 60 MG PO TABS
30.0000 mg | ORAL_TABLET | Freq: Three times a day (TID) | ORAL | Status: DC
  Administered 2013-07-30 – 2013-08-04 (×14): 30 mg via ORAL
  Filled 2013-07-30 (×16): qty 0.5

## 2013-07-30 MED ORDER — SIMVASTATIN 40 MG PO TABS
40.0000 mg | ORAL_TABLET | Freq: Every day | ORAL | Status: DC
  Administered 2013-07-30 – 2013-08-03 (×5): 40 mg via ORAL
  Filled 2013-07-30 (×6): qty 1

## 2013-07-30 MED ORDER — VANCOMYCIN HCL 10 G IV SOLR
1500.0000 mg | Freq: Once | INTRAVENOUS | Status: AC
  Administered 2013-07-30: 1500 mg via INTRAVENOUS
  Filled 2013-07-30: qty 1500

## 2013-07-30 MED ORDER — VANCOMYCIN HCL IN DEXTROSE 1-5 GM/200ML-% IV SOLN
1000.0000 mg | INTRAVENOUS | Status: DC
  Administered 2013-07-31 – 2013-08-01 (×2): 1000 mg via INTRAVENOUS
  Filled 2013-07-30 (×3): qty 200

## 2013-07-30 MED ORDER — SODIUM CHLORIDE 0.9 % IV BOLUS (SEPSIS)
500.0000 mL | Freq: Once | INTRAVENOUS | Status: AC
  Administered 2013-07-30: 500 mL via INTRAVENOUS

## 2013-07-30 MED ORDER — TAMSULOSIN HCL 0.4 MG PO CAPS
0.4000 mg | ORAL_CAPSULE | Freq: Every day | ORAL | Status: DC
  Administered 2013-07-30 – 2013-08-03 (×5): 0.4 mg via ORAL
  Filled 2013-07-30 (×6): qty 1

## 2013-07-30 MED ORDER — ASPIRIN 81 MG PO CHEW
81.0000 mg | CHEWABLE_TABLET | Freq: Every day | ORAL | Status: DC
  Administered 2013-07-31 – 2013-08-04 (×5): 81 mg via ORAL
  Filled 2013-07-30 (×5): qty 1

## 2013-07-30 MED ORDER — SODIUM CHLORIDE 0.9 % IJ SOLN: 3.0000 mL | INTRAMUSCULAR | Status: DC | PRN

## 2013-07-30 MED ORDER — ACETAMINOPHEN 650 MG RE SUPP: 650.0000 mg | Freq: Four times a day (QID) | RECTAL | Status: DC | PRN

## 2013-07-30 MED ORDER — ADULT MULTIVITAMIN W/MINERALS CH
1.0000 | ORAL_TABLET | Freq: Every morning | ORAL | Status: DC
  Administered 2013-07-31 – 2013-08-02 (×3): 1 via ORAL
  Filled 2013-07-30 (×3): qty 1

## 2013-07-30 MED ORDER — CLINDAMYCIN PHOSPHATE 600 MG/50ML IV SOLN
600.0000 mg | Freq: Once | INTRAVENOUS | Status: AC
  Administered 2013-07-30: 600 mg via INTRAVENOUS
  Filled 2013-07-30 (×2): qty 50

## 2013-07-30 NOTE — ED Provider Notes (Signed)
CSN: [id number]     Arrival date & time 07/30/13  1622 History   First MD Initiated Contact with Patient 07/30/13 1629     Chief Complaint  Patient presents with  . Altered Mental Status  . Leg Swelling   (Consider location/radiation/quality/duration/timing/severity/associated sxs/prior Treatment) The history is provided by the patient and a relative.  Marvin [name] is a 79 y.o. male hx of myasthenia gravis, HTN, HL, here with worsening weakness. He lives in independent living. Last 2 days he has been having worsening weakness. He usually walks by himself now has trouble walking. As per daughter he is more confused than usual. His legs are chronically swollen but the nurse noted that he may have an area of cellulitis of the left leg. Denies any fevers at home. He has stable shortness of breath. Denies any recent falls.    Past Medical History  Diagnosis Date  . Hyperlipidemia   . Hypertension   . Dizziness   . Blood clot in vein   . Arthritis   . Peripheral arterial disease   . Carotid artery occlusion   . Ocular myasthenia gravis   . BPH (benign prostatic hyperplasia)   . Nephrolithiasis   . Low back pain   . Gait disorder   . Hx of radiation therapy 07/06/12, 07/08/12, 07/13/12    left upper lung nodule, 3 fractions  . Lung cancer 05/2012    left  . Hearing difficulty   . Lumbago   . Abnormality of gait 05/09/2013  . Hearing deficit     right hearing aid  . Peripheral edema    Past Surgical History  Procedure Laterality Date  . Appendectomy    . Cataract extraction      bilateral  . Retinal detachment surgery    . Thyroidectomy, partial     Family History  Problem Relation Age of Onset  . Cancer Mother   . Pneumonia Father   . Stroke Sister   . Hypertension Sister   . Heart disease Sister    History  Substance Use Topics  . Smoking status: Former Smoker -- 1.50 packs/day for 40 years    Types: Cigarettes  . Smokeless tobacco: Never Used     Comment:  Quit in 1958  . Alcohol Use: No    Review of Systems  Neurological: Positive for weakness.  All other systems reviewed and are negative.    Allergies  Hydrocodone  Home Medications   Current Outpatient Rx  Name  Route  Sig  Dispense  Refill  . amLODipine (NORVASC) 2.5 MG tablet   Oral   Take 2.5 mg by mouth every morning.          Marland Kitchen aspirin 81 MG chewable tablet   Oral   Chew 81 mg by mouth every morning.          . calcium carbonate (OS-CAL) 600 MG TABS   Oral   Take 600 mg by mouth daily with lunch.         . furosemide (LASIX) 20 MG tablet   Oral   Take 20-40 mg by mouth every morning. Takes and additional  40 mg in the evening as needed         . hydrochlorothiazide (HYDRODIURIL) 25 MG tablet   Oral   Take 12.5 mg by mouth every morning.          Marland Kitchen ibuprofen (ADVIL,MOTRIN) 200 MG tablet   Oral   Take 400 mg by mouth every  6 (six) hours as needed.         . Multiple Vitamin (MULTIVITAMIN WITH MINERALS) TABS   Oral   Take 1 tablet by mouth every morning.          . predniSONE (DELTASONE) 5 MG tablet   Oral   Take 1 tablet (5 mg total) by mouth 3 (three) times daily.   90 tablet   6   . pyridostigmine (MESTINON) 60 MG tablet   Oral   Take 30 mg by mouth 3 (three) times daily.          . simvastatin (ZOCOR) 40 MG tablet   Oral   Take 40 mg by mouth at bedtime.           . Tamsulosin HCl (FLOMAX) 0.4 MG CAPS   Oral   Take 0.4 mg by mouth at bedtime.         . Vitamin D, Ergocalciferol, (DRISDOL) 50000 UNITS CAPS   Oral   Take 50,000 Units by mouth every 7 (seven) days. On Sundays          BP 127/51  Pulse 82  Temp(Src) 97.9 F (36.6 C) (Oral)  Resp 18  SpO2 96% Physical Exam  Nursing note and vitals reviewed. Constitutional:  Chronically ill, NAD, hard of hearing   HENT:  Head: Normocephalic.  Mouth/Throat: Oropharynx is clear and moist.  Eyes: Conjunctivae are normal. Pupils are equal, round, and reactive to light.   Neck: Normal range of motion. Neck supple.  Cardiovascular: Normal rate, regular rhythm and normal heart sounds.   Pulmonary/Chest: Effort normal.  Dec breath sounds throughout   Abdominal: Soft. Bowel sounds are normal. He exhibits no distension. There is no tenderness. There is no rebound.  Musculoskeletal: Normal range of motion.  2+ edema bilateral legs. Multiple blisters on them (chronic). On lateral L ankle, there is small area of cellulitis, no fluctuance   Neurological: He is alert.  Strength 4/5 throughout.   Skin: Skin is warm and dry.  Psychiatric: He has a normal mood and affect. His behavior is normal. Judgment and thought content normal.    ED Course  Procedures (including critical care time) Labs Review Labs Reviewed  CBC WITH DIFFERENTIAL - Abnormal; Notable for the following:    WBC 11.6 (*)    RBC 3.08 (*)    Hemoglobin 9.5 (*)    HCT 28.8 (*)    Neutrophils Relative % 86 (*)    Neutro Abs 9.9 (*)    Lymphocytes Relative 8 (*)    All other components within normal limits  COMPREHENSIVE METABOLIC PANEL - Abnormal; Notable for the following:    Sodium 134 (*)    Chloride 95 (*)    Glucose, Bld 115 (*)    BUN 47 (*)    Creatinine, Ser 1.64 (*)    Albumin 2.9 (*)    AST 71 (*)    ALT 77 (*)    GFR calc non Af Amer 33 (*)    GFR calc Af Amer 39 (*)    All other components within normal limits  PRO B NATRIURETIC PEPTIDE - Abnormal; Notable for the following:    Pro B Natriuretic peptide (BNP) 1356.0 (*)    All other components within normal limits  URINALYSIS, ROUTINE W REFLEX MICROSCOPIC  CBC  BASIC METABOLIC PANEL  POCT I-STAT TROPONIN I   Imaging Review Dg Chest 2 View  07/30/2013   CLINICAL DATA:  Altered mental status, leg swelling.  EXAM:  CHEST  2 VIEW  COMPARISON:  Correlated with chest CT dated 04/28/2013  FINDINGS: Low lung volumes. Cardiac silhouette is enlarged. An area of increased density is appreciated within the left lung base consistent  with previously described postradiation changes. No new focal regions of consolidation are new focal infiltrates identified. The osseous structures unremarkable.  IMPRESSION: Chronic changes without evidence of acute cardiopulmonary disease.   Electronically Signed   By: Salome Holmes M.D.   On: 07/30/2013 17:23   Ct Head Wo Contrast  07/30/2013   CLINICAL DATA:  Altered mental status  EXAM: CT HEAD WITHOUT CONTRAST  TECHNIQUE: Contiguous axial images were obtained from the base of the skull through the vertex without intravenous contrast.  COMPARISON:  None.  FINDINGS: A focal partially wedge-shaped area of low attenuation projects within the posterior lateral aspect of the right frontal lobe. Finding has the appearance of a chronic region of the MCA distribution infarction. There is diffuse cortical atrophy and diffuse areas of low attenuation within the subcortical, deep, and periventricular white matter regions. Cerebellar atrophy is identified. Punctate areas of low attenuation projects within the peripheral medial aspect of the left cerebellar hemisphere. The ventricles and cisterns are patent. There is no evidence of mass effect. A coarse area of dystrophic appearing calcification projects within the central base of the interhemispheric fissure region of the frontal lobe. Finding likely represents sequela of a calcified meningioma. The patient has a history of lung neoplasia and an area of calcified metastatic disease or treated metastatic disease cannot be excluded, there is no appreciable surrounding edema nor mass effect producing differential consideration. A 2nd course calcified patient is identified along the posterior vertex of the left frontal lobe again likely representing a calcified meningioma. Mild hydrocephalus ex vacuo is appreciated. There is no evidence of intra-axial no extra-axial fluid collections no evidence of acute hemorrhage. The osseous structures demonstrate no evidence of a  depressed skull fracture. The visualized paranasal sinuses are patent. There are consolidative foci within the right there are areas of soft tissue attenuation within the mastoid air cells on the right. The mastoid air cells on the left are patent.  IMPRESSION: 1. 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 2. Involutional and chronic changes. 3. Dystrophic appearing regions of calcification likely reflecting calcified meningiomas number for the sequela of mastoiditis on the right cannot be excluded. If clinically appropriate 4. Otherwise no evidence of acute abnormalities.   Electronically Signed   By: Salome Holmes M.D.   On: 07/30/2013 17:59    EKG Interpretation    Date/Time:  Saturday July 30 2013 16:46:58 EST Ventricular Rate:  87 PR Interval:  134 QRS Duration: 95 QT Interval:  365 QTC Calculation: 439 R Axis:   0 Text Interpretation:  Sinus rhythm Minimal ST depression, anterolateral leads Artifact in lead(s) I II III aVR aVL aVF V1 V6 No significant change since last tracing Confirmed by YAO  MD, DAVID [phone]) on 07/30/2013 4:50:30 PM            MDM   1. Altered mental status   2. BPH (benign prostatic hyperplasia)   3. CKD (chronic kidney disease)   4. Hypertension    Marvin [name] is a 79 y.o. male here with weakness, L leg cellulitis. Will do sepsis workup with labs, CXR, UA. Will get CT head given ams. Will give abx for cellulitis. Can also be myasthenia gravis exacerbation.    7 PM He has some cellulitis, given  clinda. Cr slightly elevated, given IVF. Admit for dehydration, AMS, cellulitis.      Richardean Canal, MD 07/30/13 [phone]

## 2013-07-30 NOTE — Progress Notes (Signed)
ANTIBIOTIC CONSULT NOTE - INITIAL  Pharmacy Consult for Vancomycin, Zosyn Indication: Mastoiditis  Allergies  Allergen Reactions  . Hydrocodone     unknown    Patient Measurements:   Adjusted Body Weight:   Vital Signs: Temp: 97.9 F (36.6 C) (12/13 1832) Temp src: Oral (12/13 1832) BP: 118/47 mmHg (12/13 1945) Pulse Rate: 84 (12/13 1945) Intake/Output from previous day:   Intake/Output from this shift: Total I/O In: -  Out: 200 [Urine:200]  Labs:  Recent Labs  07/30/13 1729  WBC 11.6*  HGB 9.5*  PLT 251  CREATININE 1.64*   The CrCl is unknown because both a height and weight (above a minimum accepted value) are required for this calculation. No results found for this basename: VANCOTROUGH, VANCOPEAK, VANCORANDOM, GENTTROUGH, GENTPEAK, GENTRANDOM, TOBRATROUGH, TOBRAPEAK, TOBRARND, AMIKACINPEAK, AMIKACINTROU, AMIKACIN,  in the last 72 hours   Microbiology: No results found for this or any previous visit (from the past 720 hour(s)).  Medical History: Past Medical History  Diagnosis Date  . Hyperlipidemia   . Hypertension   . Dizziness   . Blood clot in vein   . Arthritis   . Peripheral arterial disease   . Carotid artery occlusion   . Ocular myasthenia gravis   . BPH (benign prostatic hyperplasia)   . Nephrolithiasis   . Low back pain   . Gait disorder   . Hx of radiation therapy 07/06/12, 07/08/12, 07/13/12    left upper lung nodule, 3 fractions  . Lung cancer 05/2012    left  . Hearing difficulty   . Lumbago   . Abnormality of gait 05/09/2013  . Hearing deficit     right hearing aid  . Peripheral edema     Assessment: 68 yoM with hx myasthenia gravis, CKD, HTN, HLD, CAD, PAD, and hx of CVA admitted with AMS. CT head showed chronic MCA distribution infarction on the right and calcifications likely reflecting calcified meningiomas number for the sequela of mastoiditis on the right could not be excluded.  Pt also with LE cellulitis. Pharmacy  consulted to dose empiric vancomycin and zosyn for mastoiditis vs cellulitis.  Will aim for higher trough due to possible mastoiditis indication.   NKDA Wt 82.6kg Ht 5'7"  D#1 Antibiotics 12/13 >> Vancomycin >> 12/13 >> Zosyn >>  Tm24h: 98.3 Renal: AoCKD: Baseline SCr ~1.3, last 1.64, CrCl 26.5 CG (N25) WBC: Slightly elevated at 11.6  Goal of Therapy:  Vancomycin trough level 15-20 mcg/ml  Plan:  Vancomycin 1500mg  IV x 1, then Vancomycin 1g IV q 24 h Zosyn 3.375g IV q 8 hours (extended infusion) F/u renal function, clinical course, vancomycin trough at [facility]  Haynes Hoehn, PharmD, BCPS 07/30/2013, 9:04 PM  Pager: [phone]

## 2013-07-30 NOTE — ED Notes (Signed)
Troponin Result announced to [employer] and DR Criss Alvine

## 2013-07-30 NOTE — ED Notes (Signed)
While changing patient, RN noticed a wound area on his upper pubic area, red and crusted in nature. Pt and family say they are unaware of what it is or that it was even there.

## 2013-07-30 NOTE — ED Notes (Signed)
His daughter states pt. Is more weak than usual; but what is more concerning to her is that today she found pt. To be confused, which is not his norm.  She further tells [place] that pt. Has hx of mysthenia gravis.  He is in no distress.

## 2013-07-30 NOTE — H&P (Signed)
[facility] History and Physical  Marvin [name] HYQ:[id number] DOB: 1913/09/25 DOA: 07/30/2013  Referring physician: Dr.  Silverio Lay PCP: [name], MD   Chief Complaint: Altered mental status  HPI: Marvin [name] is a 79 y.o. male  Caucasian male with history of myasthenia gravis, chronic kidney disease, hypertension, hyperlipidemia, history of CVA who reportedly lives independently at assisted living. Daughter reports that within the last week patient has not been as sharp as his usual self. And per daughter patient seems more confused has not been able to remember certain dates and is easily distractible and not his usual self. He had also within the same period of time had decrease in oral intake and increasing weakness and given these things it was decided to bring him into the emergency department for further evaluation. Patient denies any fevers or chills.  In the ED patient had CT of head which showed chronic MCA distribution infarction on the right and calcifications likely reflecting calcified meningiomas number for the sequela of mastoiditis on the right could not be excluded. Patient's creatinine is usually around 1.3 and his last was 1.6. Given altered mental status possibility of infection at his lower extremities versus mastoiditis we were consulted for admission.   Review of Systems:  Constitutional:  No weight loss, night sweats, Fevers, chills, fatigue.  HEENT:  No headaches, Difficulty swallowing,+ Tooth/dental problems,Sore throat,  No sneezing, itching, ear ache, nasal congestion, post nasal drip,  Cardio-vascular:  No chest pain, Orthopnea, PND, swelling in lower extremities, anasarca, dizziness, palpitations  GI:  No heartburn, indigestion, abdominal pain, nausea, vomiting, diarrhea, change in bowel habits, loss of appetite  Resp:  No shortness of breath with exertion or at rest. No excess mucus, no productive cough, No non-productive cough, No coughing  up of blood.No change in color of mucus.No wheezing.No chest wall deformity  Skin:  no rash or lesions.  GU:  no dysuria, change in color of urine, no urgency or frequency. No flank pain.  Musculoskeletal:  No joint pain or swelling. No decreased range of motion. No back pain.  Psych:  No change in mood or affect. No depression or anxiety. No memory loss.   Past Medical History  Diagnosis Date  . Hyperlipidemia   . Hypertension   . Dizziness   . Blood clot in vein   . Arthritis   . Peripheral arterial disease   . Carotid artery occlusion   . Ocular myasthenia gravis   . BPH (benign prostatic hyperplasia)   . Nephrolithiasis   . Low back pain   . Gait disorder   . Hx of radiation therapy 07/06/12, 07/08/12, 07/13/12    left upper lung nodule, 3 fractions  . Lung cancer 05/2012    left  . Hearing difficulty   . Lumbago   . Abnormality of gait 05/09/2013  . Hearing deficit     right hearing aid  . Peripheral edema    Past Surgical History  Procedure Laterality Date  . Appendectomy    . Cataract extraction      bilateral  . Retinal detachment surgery    . Thyroidectomy, partial     Social History:  reports that he has quit smoking. His smoking use included Cigarettes. He has a 60 pack-year smoking history. He has never used smokeless tobacco. He reports that he does not drink alcohol or use illicit drugs.  Allergies  Allergen Reactions  . Hydrocodone     unknown    Family History  Problem Relation  Age of Onset  . Cancer Mother   . Pneumonia Father   . Stroke Sister   . Hypertension Sister   . Heart disease Sister      Prior to Admission medications   Medication Sig Start Date End Date Taking? Authorizing Provider  amLODipine (NORVASC) 2.5 MG tablet Take 2.5 mg by mouth every morning.    Yes Historical Provider, MD  aspirin 81 MG chewable tablet Chew 81 mg by mouth every morning.    Yes Historical Provider, MD  calcium carbonate (OS-CAL) 600 MG TABS Take 600  mg by mouth daily with lunch.   Yes Historical Provider, MD  furosemide (LASIX) 20 MG tablet Take 20-40 mg by mouth every morning. Takes and additional  40 mg in the evening as needed   Yes Historical Provider, MD  hydrochlorothiazide (HYDRODIURIL) 25 MG tablet Take 12.5 mg by mouth every morning.    Yes Historical Provider, MD  ibuprofen (ADVIL,MOTRIN) 200 MG tablet Take 400 mg by mouth every 6 (six) hours as needed.   Yes Historical Provider, MD  Multiple Vitamin (MULTIVITAMIN WITH MINERALS) TABS Take 1 tablet by mouth every morning.    Yes Historical Provider, MD  predniSONE (DELTASONE) 5 MG tablet Take 1 tablet (5 mg total) by mouth 3 (three) times daily. 12/24/12  Yes York Spaniel, MD  pyridostigmine (MESTINON) 60 MG tablet Take 30 mg by mouth 3 (three) times daily.    Yes Historical Provider, MD  simvastatin (ZOCOR) 40 MG tablet Take 40 mg by mouth at bedtime.     Yes Historical Provider, MD  Tamsulosin HCl (FLOMAX) 0.4 MG CAPS Take 0.4 mg by mouth at bedtime.   Yes Historical Provider, MD  Vitamin D, Ergocalciferol, (DRISDOL) 50000 UNITS CAPS Take 50,000 Units by mouth every 7 (seven) days. On Sundays   Yes Historical Provider, MD   Physical Exam: Filed Vitals:   07/30/13 1832  BP:   Pulse:   Temp: 97.9 F (36.6 C)  Resp:     BP 127/51  Pulse 82  Temp(Src) 97.9 F (36.6 C) (Oral)  Resp 18  SpO2 96%  General: Alert, awake, oriented x3, in no acute distress. Head: Atraumatic, normocephalic Eyes: Extraocular movements intact, nonicteric Ears: Normal exterior appearance, no masses on visual examination cellulitis Neck: Supple, no goiter Heart: Regular rate and rhythm, without murmurs, rubs, gallops. Lungs: Clear to auscultation bilaterally. Abdomen: Soft, nontender, nondistended, positive bowel sounds. Extremities: Patient has bilateral lower extremity edema with excoriations and erythema surrounding various excoriations diffusely on lower extremities bilaterally Neuro:  Grossly intact, nonfocal.           Labs on Admission:  Basic Metabolic Panel:  Recent Labs Lab 07/30/13 1729  NA 134*  K 4.2  CL 95*  CO2 28  GLUCOSE 115*  BUN 47*  CREATININE 1.64*  CALCIUM 9.2   Liver Function Tests:  Recent Labs Lab 07/30/13 1729  AST 71*  ALT 77*  ALKPHOS 81  BILITOT 1.1  PROT 6.2  ALBUMIN 2.9*   No results found for this basename: LIPASE, AMYLASE,  in the last 168 hours No results found for this basename: AMMONIA,  in the last 168 hours CBC:  Recent Labs Lab 07/30/13 1729  WBC 11.6*  NEUTROABS 9.9*  HGB 9.5*  HCT 28.8*  MCV 93.5  PLT 251   Cardiac Enzymes: No results found for this basename: CKTOTAL, CKMB, CKMBINDEX, TROPONINI,  in the last 168 hours  BNP (last 3 results)  Recent Labs  07/30/13  1729  PROBNP 1356.0*   CBG: No results found for this basename: GLUCAP,  in the last 168 hours  Radiological Exams on Admission: Dg Chest 2 View  07/30/2013   CLINICAL DATA:  Altered mental status, leg swelling.  EXAM: CHEST  2 VIEW  COMPARISON:  Correlated with chest CT dated 04/28/2013  FINDINGS: Low lung volumes. Cardiac silhouette is enlarged. An area of increased density is appreciated within the left lung base consistent with previously described postradiation changes. No new focal regions of consolidation are new focal infiltrates identified. The osseous structures unremarkable.  IMPRESSION: Chronic changes without evidence of acute cardiopulmonary disease.   Electronically Signed   By: Salome Holmes M.D.   On: 07/30/2013 17:23   Ct Head Wo Contrast  07/30/2013   CLINICAL DATA:  Altered mental status  EXAM: CT HEAD WITHOUT CONTRAST  TECHNIQUE: Contiguous axial images were obtained from the base of the skull through the vertex without intravenous contrast.  COMPARISON:  None.  FINDINGS: A focal partially wedge-shaped area of low attenuation projects within the posterior lateral aspect of the right frontal lobe. Finding has the  appearance of a chronic region of the MCA distribution infarction. There is diffuse cortical atrophy and diffuse areas of low attenuation within the subcortical, deep, and periventricular white matter regions. Cerebellar atrophy is identified. Punctate areas of low attenuation projects within the peripheral medial aspect of the left cerebellar hemisphere. The ventricles and cisterns are patent. There is no evidence of mass effect. A coarse area of dystrophic appearing calcification projects within the central base of the interhemispheric fissure region of the frontal lobe. Finding likely represents sequela of a calcified meningioma. The patient has a history of lung neoplasia and an area of calcified metastatic disease or treated metastatic disease cannot be excluded, there is no appreciable surrounding edema nor mass effect producing differential consideration. A 2nd course calcified patient is identified along the posterior vertex of the left frontal lobe again likely representing a calcified meningioma. Mild hydrocephalus ex vacuo is appreciated. There is no evidence of intra-axial no extra-axial fluid collections no evidence of acute hemorrhage. The osseous structures demonstrate no evidence of a depressed skull fracture. The visualized paranasal sinuses are patent. There are consolidative foci within the right there are areas of soft tissue attenuation within the mastoid air cells on the right. The mastoid air cells on the left are patent.  IMPRESSION: 1. 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 2. Involutional and chronic changes. 3. Dystrophic appearing regions of calcification likely reflecting calcified meningiomas number for the sequela of mastoiditis on the right cannot be excluded. If clinically appropriate 4. Otherwise no evidence of acute abnormalities.   Electronically Signed   By: Salome Holmes M.D.   On: 07/30/2013 17:59    EKG: Independently reviewed. Sinus  rhythm with no ST elevations or depressions  Assessment/Plan Principal Problem:   Altered mental status - At this point most likely suspecting infectious etiology given possibility of mastoiditis versus cellulitis of lower extremities - Will cover with broad-spectrum antibiotics and reassess mental status next a.m. - Monitor vital and follow WBC count on admission WBC elevated at 11.6  Active Problems:   Hypertension - Given age and mild dehydration we'll hold off on blood pressure medications at this point - Will consider restarting if systolic blood pressure above 409/81     CKD (chronic kidney disease) - Close to baseline, we'll hold Lasix and reassess serum creatinine next a.m.  BPH (benign prostatic hyperplasia) - Stable continue home regimen    Myasthenia gravis - Stable continue home regimen   Code Status: Full code Family Communication: Discussed with patient and daughter at bedside Disposition Plan: With improvement in mental status and recommendations from physical therapy likely discharged next one to 2 days  Time spent: More than 60 minutes  Penny Pia [facility] Pager [phone]

## 2013-07-30 NOTE — ED Notes (Signed)
Pt presents with family with c/o altered mental status/confusion and leg swelling. Per family, pt has had an increase in confusion over the past few days. Pt has significant leg swelling on both legs. Pt also has redness and many blisters over the lower portions of his legs.

## 2013-07-31 DIAGNOSIS — G9340 Encephalopathy, unspecified: Secondary | ICD-10-CM

## 2013-07-31 DIAGNOSIS — J9600 Acute respiratory failure, unspecified whether with hypoxia or hypercapnia: Secondary | ICD-10-CM

## 2013-07-31 DIAGNOSIS — R4182 Altered mental status, unspecified: Secondary | ICD-10-CM | POA: Diagnosis present

## 2013-07-31 DIAGNOSIS — J9601 Acute respiratory failure with hypoxia: Secondary | ICD-10-CM | POA: Diagnosis present

## 2013-07-31 LAB — CBC
HCT: 24.3 % — ABNORMAL LOW (ref 39.0–52.0)
Hemoglobin: 8 g/dL — ABNORMAL LOW (ref 13.0–17.0)
MCV: 92.7 fL (ref 78.0–100.0)
Platelets: 209 10*3/uL (ref 150–400)
RBC: 2.62 MIL/uL — ABNORMAL LOW (ref 4.22–5.81)
RDW: 15.4 % (ref 11.5–15.5)
WBC: 10 10*3/uL (ref 4.0–10.5)

## 2013-07-31 LAB — BASIC METABOLIC PANEL
CO2: 29 mEq/L (ref 19–32)
Calcium: 8.3 mg/dL — ABNORMAL LOW (ref 8.4–10.5)
Chloride: 101 mEq/L (ref 96–112)
GFR calc Af Amer: 41 mL/min — ABNORMAL LOW (ref >90)
Glucose, Bld: 102 mg/dL — ABNORMAL HIGH (ref 70–99)
Potassium: 3.5 mEq/L (ref 3.5–5.1)
Sodium: 137 mEq/L (ref 135–145)

## 2013-07-31 MED ORDER — FUROSEMIDE 20 MG PO TABS
20.0000 mg | ORAL_TABLET | Freq: Every day | ORAL | Status: DC
  Filled 2013-07-31: qty 2

## 2013-07-31 MED ORDER — FUROSEMIDE 20 MG PO TABS: 20.0000 mg | ORAL_TABLET | Freq: Every day | ORAL | Status: DC

## 2013-07-31 MED ORDER — CALCIUM CARBONATE 1250 (500 CA) MG PO TABS
1250.0000 mg | ORAL_TABLET | Freq: Every day | ORAL | Status: DC
  Administered 2013-08-01 – 2013-08-03 (×3): 1250 mg via ORAL
  Filled 2013-07-31 (×5): qty 1

## 2013-07-31 MED ORDER — FUROSEMIDE 20 MG PO TABS
20.0000 mg | ORAL_TABLET | Freq: Every day | ORAL | Status: DC
  Administered 2013-07-31 – 2013-08-04 (×5): 20 mg via ORAL
  Filled 2013-07-31 (×6): qty 1

## 2013-07-31 NOTE — Progress Notes (Signed)
Patient admitted from ED to room 1311. VSS. Afebrile. Patient a/o x4. No complaints.

## 2013-07-31 NOTE — Progress Notes (Addendum)
[facility] PROGRESS NOTE  Marvin [name] WUJ:[id number] DOB: March 26, 1914 DOA: 07/30/2013 PCP: [name], MD  Brief narrative: 79 y.o. male with past medical history of myasthenia gravis, chronic kidney disease, hypertension, hyperlipidemia, history of CVA who presented to [facility] ED 07/30/2013 with worsening confusion, altered mental status and poor PO intake for past few days prior to this admission.  In the ED patient had CT of head which showed chronic MCA distribution infarction on the right and calcifications likely reflecting calcified meningiomas and likely sequela of mastoiditis on the right.   Assessment/Plan:  Principal Problem:   Acute encephalopathy - perhaps dementia, delirium or due to an acute infection/mastoiditis - treatment with vanco and zosyn initiated - will see if there will be any improvement in mental status changes - PT evaluation for safe discharge planning Active Problems:   Acute respiratory failure with hypoxia - unclear etiology - oxygen saturation was 89% on room air on the admission but now with nasal canula oxygen support oxygen saturation remains 94 -97%   Mastoiditis - on vancomycin and zosyn    Hypertension - held antihypertensives as BP on soft side, 106/46   CKD (chronic kidney disease), stage 3 - baseline creatinine 1.6 - creatinine on this admission was 1.64 so around his baseline   BPH (benign prostatic hyperplasia) - continue flomax   Dyslipidemia - continue statin therapy    Myasthenia gravis - on steroid, low dose 5 mg TID - continue pyridostigmine   Code Status: full code Family Communication: no family at the bedsdie Disposition Plan: remains inpatient   Manson Passey, MD  [facility] Pager [phone]  If 7PM-7AM, please contact night-coverage [email or web address] Password [email or web address] 07/31/2013, 7:00 AM   LOS: 1 day   Consultants:  Wound care  Procedures:  None   Antibiotics:  Vancomycin 07/30/2013  -->  Zosyn 07/30/2013 -->  HPI/Subjective: No overnight events.   Objective: Filed Vitals:   07/30/13 1832 07/30/13 1929 07/30/13 1945 07/30/13 2102  BP:   118/47 106/46  Pulse:  92 84 87  Temp: 97.9 F (36.6 C)   98.3 F (36.8 C)  TempSrc: Oral   Oral  Resp:  20 14 16   Height:    5\' 7"  (1.702 m)  Weight:    82.6 kg (182 lb 1.6 oz)  SpO2:  97% 89% 94%    Intake/Output Summary (Last 24 hours) at 07/31/13 0700 Last data filed at 07/30/13 1909  Gross per 24 hour  Intake      0 ml  Output    200 ml  Net   -200 ml    Exam:   General:  Pt is alert, not in acute distress  Cardiovascular: regular rhythm, S1/S2 appreciated  Respiratory: Clear to auscultation bilaterally, no wheezing, no crackles, no rhonchi  Abdomen: Soft, non tender, non distended, bowel sounds present, no guarding  Extremities: Pulses DP and PT palpable bilaterally  Neuro: Grossly nonfocal  Data Reviewed: Basic Metabolic Panel:  Recent Labs Lab 07/30/13 1729 07/31/13 0437  NA 134* 137  K 4.2 3.5  CL 95* 101  CO2 28 29  GLUCOSE 115* 102*  BUN 47* 42*  CREATININE 1.64* 1.57*  CALCIUM 9.2 8.3*   Liver Function Tests:  Recent Labs Lab 07/30/13 1729  AST 71*  ALT 77*  ALKPHOS 81  BILITOT 1.1  PROT 6.2  ALBUMIN 2.9*   No results found for this basename: LIPASE, AMYLASE,  in the last 168 hours No results found for this basename: AMMONIA,  in  the last 168 hours CBC:  Recent Labs Lab 07/30/13 1729 07/31/13 0437  WBC 11.6* 10.0  NEUTROABS 9.9*  --   HGB 9.5* 8.0*  HCT 28.8* 24.3*  MCV 93.5 92.7  PLT 251 209   Cardiac Enzymes: No results found for this basename: CKTOTAL, CKMB, CKMBINDEX, TROPONINI,  in the last 168 hours BNP: No components found with this basename: POCBNP,  CBG: No results found for this basename: GLUCAP,  in the last 168 hours  MRSA PCR SCREENING     Status: None   Collection Time    07/30/13 11:30 PM      Result Value Range Status   MRSA by PCR  NEGATIVE  NEGATIVE Final     Studies: Dg Chest 2 View 07/30/2013    IMPRESSION: Chronic changes without evidence of acute cardiopulmonary disease.   Electronically Signed   By: Salome Holmes M.D.   On: 07/30/2013 17:23   Ct Head Wo Contrast 07/30/2013   t IMPRESSION: 1. 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 2. Involutional and chronic changes. 3. Dystrophic appearing regions of calcification likely reflecting calcified meningiomas number for the sequela of mastoiditis on the right cannot be excluded. If clinically appropriate 4. Otherwise no evidence of acute abnormalities.   Electronically Signed   By: Salome Holmes M.D.   On: 07/30/2013 17:59    Scheduled Meds: . aspirin  81 mg Oral Daily  . calcium carbonate  1,250 mg Oral Q lunch  . Multivitamin   1 tablet Oral q morning - 10a  . piperacillin-tazobactam   3.375 g Intravenous Q8H  . predniSONE  5 mg Oral TID AC  . pyridostigmine  30 mg Oral TID  . simvastatin  40 mg Oral QHS  . tamsulosin  0.4 mg Oral QHS  . vancomycin  1,000 mg Intravenous Q24H

## 2013-07-31 NOTE — Plan of Care (Signed)
Problem: Phase I Progression Outcomes Goal: Voiding-avoid urinary catheter unless indicated Outcome: Completed/Met Date Met:  07/31/13 Incontinent

## 2013-07-31 NOTE — Progress Notes (Signed)
Utilization Review completed.  

## 2013-08-01 ENCOUNTER — Ambulatory Visit: Payer: Medicare Other | Admitting: Podiatry

## 2013-08-01 DIAGNOSIS — L0291 Cutaneous abscess, unspecified: Secondary | ICD-10-CM

## 2013-08-01 NOTE — Consult Note (Signed)
WOC wound consult note Reason for Consult: Consult requested for bilat legs.  Pt with generalized edema and erythremia to both calves and feet.  Pt is hard of hearing but daughter is at bedside to assess legs and answer questions.  She states he frequently does not take his lasix and cannot put his compression stockings on when he is not in the hospital. Wound type: Left leg with small amt yellow weeping drainage.  No open wound or odor. Measurement: Right posterior calf with patchy areas of partial thickness skin loss, affected area approx 4X4X.1cm. Wound bed: red and moist Drainage (amount, consistency, odor) Large amt yellow drainage, no odor Periwound: Intact skin surrounding Dressing procedure/placement/frequency: Applied foam dressing to right leg to protect, absorb drainage, and promote healing.  ABD pads to both legs with ace wrap to provide light compression.  Discussed topical care with daughter at bedside and discussed where to obtain products after discharge if they are needed.  She appears to have a good understanding of the plan of care. Please re-consult if further assistance is needed.  Thank-you,  Cammie Mcgee MSN, RN, CWOCN, [place], CNS [phone]

## 2013-08-01 NOTE — Care Management Note (Signed)
Cm spoke with patient and adult daughter at bedside concerning discharge planning. Pt resides at [facility]. Pt had previously lived independently. Per pt may need to hire private duty care vs transitioning to SNF. Pt eval ordered. Cm provided patient and daughter with Home Health choice list and Private duty care list. Per pt has utilized [facility] in the past. Will continue to monitor.    Roxy Manns Jaedyn Marrufo,MSN,RN [phone]

## 2013-08-01 NOTE — Progress Notes (Signed)
[facility] PROGRESS NOTE  Marvin [name] [id number]:[id number] DOB: [birth year] DOA: 07/30/2013 PCP: [name], MD  Brief narrative: 79 y.o. male with past medical history of myasthenia gravis, chronic kidney disease, hypertension, hyperlipidemia, history of CVA who presented to [facility] ED 07/30/2013 with worsening confusion, altered mental status and poor PO intake for past few days prior to this admission.  In the ED patient had CT of head which showed chronic MCA distribution infarction on the right and calcifications likely reflecting calcified meningiomas and likely sequela of mastoiditis on the right.   Assessment/Plan:   Principal Problem:  Acute encephalopathy  - perhaps dementia, delirium or due to an acute infection/mastoiditis  - continue treatment with vanco and zosyn - Some improvement in mental status this morning - PT evaluation for safe discharge planning  Active Problems:  Acute respiratory failure with hypoxia  - unclear etiology  - oxygen saturation was 89% on room air on the admission but now with nasal canula oxygen support oxygen saturation is good, 92% - 100% Mastoiditis  - on vancomycin and zosyn  Hypertension  - held antihypertensives as BP on soft side, BP reasonably well controlled 127/61 LE edema, cellulitis - LLE more than right - may restart lasix per home dose - on vancomycin  CKD (chronic kidney disease), stage 3  - baseline creatinine 1.6  - creatinine on this admission was 1.64 so around his baseline  BPH (benign prostatic hyperplasia)  - continue flomax  Dyslipidemia  - continue statin therapy  Myasthenia gravis  - on steroid, low dose 5 mg TID  - continue pyridostigmine   Code Status: full code  Family Communication: family at the bedsdie  Disposition Plan: remains inpatient  Consultants:  Wound care Procedures:  None  Antibiotics:  Vancomycin 07/30/2013 -->  Zosyn 07/30/2013 -->  Manson Passey, MD  [facility] Pager  [phone]  If 7PM-7AM, please contact night-coverage [email or web address] Password [email or web address] 08/01/2013, 12:06 PM   LOS: 2 days    HPI/Subjective: No acute overnight events.  Objective: Filed Vitals:   07/31/13 0635 07/31/13 1441 07/31/13 2045 08/01/13 0540  BP: 119/49 110/49 132/55 127/61  Pulse: 87 64 87 50  Temp: 98.4 F (36.9 C) 97.9 F (36.6 C) 97.6 F (36.4 C) 98.3 F (36.8 C)  TempSrc: Oral Oral Oral Oral  Resp: 16 16 16 16   Height:      Weight:      SpO2: 95% 100% 95% 92%    Intake/Output Summary (Last 24 hours) at 08/01/13 1206 Last data filed at 08/01/13 0830  Gross per 24 hour  Intake 1118.33 ml  Output    400 ml  Net 718.33 ml    Exam:   General:  Pt is alert, follows commands appropriately, not in acute distress  Cardiovascular: bradycardic, S1/S2 appreciated   Respiratory: Clear to auscultation bilaterally, no wheezing, no crackles, no rhonchi  Abdomen: Soft, non tender, non distended, bowel sounds present, no guarding  Extremities: LE +1-2 pitting edema, redness or LLE more pronounced edema, pulses DP and PT palpable bilaterally  Neuro: Grossly nonfocal  Data Reviewed: Basic Metabolic Panel:  Recent Labs Lab 07/30/13 1729 07/31/13 0437  NA 134* 137  K 4.2 3.5  CL 95* 101  CO2 28 29  GLUCOSE 115* 102*  BUN 47* 42*  CREATININE 1.64* 1.57*  CALCIUM 9.2 8.3*   Liver Function Tests:  Recent Labs Lab 07/30/13 1729  AST 71*  ALT 77*  ALKPHOS 81  BILITOT 1.1  PROT 6.2  ALBUMIN 2.9*  No results found for this basename: LIPASE, AMYLASE,  in the last 168 hours No results found for this basename: AMMONIA,  in the last 168 hours CBC:  Recent Labs Lab 07/30/13 1729 07/31/13 0437  WBC 11.6* 10.0  NEUTROABS 9.9*  --   HGB 9.5* 8.0*  HCT 28.8* 24.3*  MCV 93.5 92.7  PLT 251 209   Cardiac Enzymes: No results found for this basename: CKTOTAL, CKMB, CKMBINDEX, TROPONINI,  in the last 168 hours BNP: No components found with this  basename: POCBNP,  CBG: No results found for this basename: GLUCAP,  in the last 168 hours  MRSA PCR SCREENING     Status: None   Collection Time    07/30/13 11:30 PM      Result Value Range Status   MRSA by PCR NEGATIVE  NEGATIVE Final     Studies: Dg Chest 2 View 07/30/2013    IMPRESSION: Chronic changes without evidence of acute cardiopulmonary disease.   Electronically Signed   By: Salome Holmes M.D.   On: 07/30/2013 17:23   Ct Head Wo Contrast 07/30/2013  IMPRESSION: 1. 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 2. Involutional and chronic changes. 3. Dystrophic appearing regions of calcification likely reflecting calcified meningiomas number for the sequela of mastoiditis on the right cannot be excluded. If clinically appropriate 4. Otherwise no evidence of acute abnormalities.   Electronically Signed   By: Salome Holmes M.D.   On: 07/30/2013 17:59    Scheduled Meds: . aspirin  81 mg Oral Daily  . calcium carbonate  1,250 mg Oral Q lunch  . furosemide  20 mg Oral Q breakfast  . heparin  5,000 Units Subcutaneous Q8H  . multivitamin with minerals  1 tablet Oral q morning - 10a  . piperacillin-tazobactam (ZOSYN)  IV  3.375 g Intravenous Q8H  . predniSONE  5 mg Oral TID AC  . pyridostigmine  30 mg Oral TID  . simvastatin  40 mg Oral QHS  . sodium chloride  3 mL Intravenous Q12H  . tamsulosin  0.4 mg Oral QHS  . vancomycin  1,000 mg Intravenous Q24H  . Vitamin D (Ergocalciferol)  50,000 Units Oral Q Sun   Continuous Infusions:

## 2013-08-01 NOTE — Progress Notes (Signed)
Clinical Social Work Department BRIEF PSYCHOSOCIAL ASSESSMENT 08/01/2013  Patient:  Marvin [name], Marvin [name]     Account Number:  [id number]     Admit date:  07/30/2013  Clinical Social Worker:  Orpah Greek  Date/Time:  08/01/2013 02:38 PM  Referred by:  Physician  Date Referred:  08/01/2013 Referred for  SNF Placement   Other Referral:   Interview type:  Patient Other interview type:    PSYCHOSOCIAL DATA Living Status:  FACILITY Admitted from facility:  [facility] Level of care:  Independent Living Primary support name:  Rafal Archuleta (daughter) ph#: [phone] Primary support relationship to patient:  CHILD, ADULT Degree of support available:   good    CURRENT CONCERNS Current Concerns  Post-Acute Placement   Other Concerns:    SOCIAL WORK ASSESSMENT / PLAN CSW received consult from PT, Clydie Braun that they have recommended SNF for patient at discharge.   Assessment/plan status:  Information/Referral to [facility] Other assessment/ plan:   Information/referral to community resources:   CSW completed FL2 and faxed information to [facility] - will provide bed offers when available.    PATIENT'S/FAMILY'S RESPONSE TO PLAN OF CARE: Patient informed CSW that he has been living at [facility] for the past 6 months but is agreeable with plan for SNF at discharge.    Per RN, daughter mentioned patient going down to [facility]. Daughter, Vernona Rieger is currently not at bedside and patient has requested that CSW not contact her as "she is driving." CSW left list of facilities & phone number for daughter to call when she arrives to hospital.       Unice Bailey, LCSW [facility] Clinical Social Worker (covering for Jacklynn Lewis - ph#: [phone])

## 2013-08-01 NOTE — Evaluation (Addendum)
Physical Therapy Evaluation Patient Details Name: Marvin [name] MRN: [id number] DOB: 12-27-1913 Today's Date: 08/01/2013 Time: 4098-1191 PT Time Calculation (min): 43 min  PT Assessment / Plan / Recommendation History of Present Illness  HPI: Marvin [name] is a 79 y.o. male  admitted 12/13  with AMSm weakness, different from normal self per daughter. Pt found with mastoiditis.   Clinical Impression  Pt presents with deconditioning, decreased balance and decreased safety when ambulating. Pt will require 24/7 caregivers for mobility  When out of bed. Pt's daughter present and awarre of recommendation for SNF rehab. Family lives out of town and would like nursing facility near [place] if possible.pt will benefit from PT to address problems listed. Recommend OT consult.    PT Assessment  Patient needs continued PT services    Follow Up Recommendations  SNF    Does the patient have the potential to tolerate intense rehabilitation      Barriers to Discharge        Equipment Recommendations  None recommended by PT    Recommendations for Other Services     Frequency Min 3X/week    Precautions / Restrictions Precautions Precautions: Fall Precaution Comments: incontinence/urgency, diarrhea, condom cath   Pertinent Vitals/Pain No c/o      Mobility  Bed Mobility Bed Mobility: Supine to Sit;Sitting - Scoot to Edge of Bed Supine to Sit: 3: Mod assist;HOB elevated;With rails Sitting - Scoot to Edge of Bed: 3: Mod assist Details for Bed Mobility Assistance: extra time and assistance to get trunk into upright position Transfers Transfers: Sit to Stand;Stand to Sit Sit to Stand: 3: Mod assist;From bed;From toilet Stand to Sit: To chair/3-in-1;To toilet;4: Min assist;3: Mod assist Details for Transfer Assistance: lifting assistance to stand, initially leans posteriorly, gradually gets weight forward over feet, improved second time stood. More assistance required  to stand  from toilet. Ambulation/Gait Ambulation/Gait Assistance: 4: Min assist;3: Mod assist Ambulation Distance (Feet): 60 Feet Assistive device: Rolling walker Ambulation/Gait Assistance Details: steady assistance initially. Pt is used to a 4 wheeled RW so assisted with turns. Gait Pattern: Step-through pattern;Decreased stride length;Shuffle    Exercises     PT Diagnosis: Generalized weakness  PT Problem List: Decreased strength;Decreased activity tolerance;Decreased balance;Decreased mobility;Decreased safety awareness;Decreased knowledge of precautions;Decreased skin integrity;Decreased knowledge of use of DME PT Treatment Interventions: DME instruction;Gait training;Functional mobility training;Therapeutic activities;Therapeutic exercise;Patient/family education     PT Goals(Current goals can be found in the care plan section) Acute Rehab PT Goals Patient Stated Goal: to get up and walk. PT Goal Formulation: With patient/family Time For Goal Achievement: 08/15/13 Potential to Achieve Goals: Good  Visit Information  Last PT Received On: 08/01/13 Assistance Needed: +1 History of Present Illness: HPI: Marvin [name] is a 79 y.o. male  admitted 12/13  with AMSm weakness, different from normal self per daughter. Pt found with mastoiditis.        Prior Functioning  Home Living Family/patient expects to be discharged to:: Skilled nursing facility ([facility].) Living Arrangements: Alone Available Help at Discharge: Skilled Nursing Facility Type of Home: Independent living facility Home Access: Level entry Home Layout: One level Home Equipment: Environmental consultant - 4 wheels Additional Comments: lives Independent living Prior Function Level of Independence: Independent with assistive device(s) Communication Communication: HOH    Cognition  Cognition Arousal/Alertness: Awake/alert Behavior During Therapy: WFL for tasks assessed/performed Overall Cognitive Status:  Impaired/Different from baseline Area of Impairment: Memory General Comments: per daughter, may not grasp concept of Rhab.  Extremity/Trunk Assessment Upper Extremity Assessment Upper Extremity Assessment: Generalized weakness Lower Extremity Assessment Lower Extremity Assessment: Generalized weakness;RLE deficits/detail;LLE deficits/detail RLE Deficits / Details: edema present. LLE Deficits / Details: , weeping drainagwe from lower legs Cervical / Trunk Assessment Cervical / Trunk Assessment: Normal   Balance Balance Balance Assessed: Yes Static Standing Balance Static Standing - Balance Support: Bilateral upper extremity supported Static Standing - Level of Assistance: 4: Min assist;3: Mod assist Static Standing - Comment/# of Minutes: at [facility] for support.  End of Session PT - End of Session Activity Tolerance: Patient tolerated treatment well Patient left: in chair;with call bell/phone within reach;with family/visitor present Nurse Communication: Mobility status  GP     Rada Hay 08/01/2013, 2:25 PM Blanchard Kelch PT [phone]

## 2013-08-01 NOTE — Progress Notes (Addendum)
Clinical Social Work Department CLINICAL SOCIAL WORK PLACEMENT NOTE 08/01/2013  Patient:  Marvin [name], Marvin [name]  Account Number:  [id number] Admit date:  07/30/2013  Clinical Social Worker:  Orpah Greek  Date/time:  08/01/2013 02:42 PM  Clinical Social Work is seeking post-discharge placement for this patient at the following level of care:   SKILLED NURSING   (*CSW will update this form in Epic as items are completed)   08/01/2013  Patient/family provided with [facility] Department of Clinical Social Work's list of facilities offering this level of care within the geographic area requested by the patient (or if unable, by the patient's family).  08/01/2013  Patient/family informed of their freedom to choose among providers that offer the needed level of care, that participate in Medicare, Medicaid or managed care program needed by the patient, have an available bed and are willing to accept the patient.  08/01/2013  Patient/family informed of [facility]' ownership interest in [facility], as well as of the fact that they are under no obligation to receive care at this facility.  PASARR submitted to EDS on 08/01/2013 PASARR number received from EDS on 08/01/2013  FL2 transmitted to all facilities in geographic area requested by pt/family on  08/01/2013 FL2 transmitted to all facilities within larger geographic area on   Patient informed that his/her managed care company has contracts with or will negotiate with  certain facilities, including the following:     Patient/family informed of bed offers received:  08/02/2013 Patient chooses bed at [facility] Physician recommends and patient chooses bed at    Patient to be transferred to  on  [facility] on 08/05/2013 Patient to be transferred to facility by ambulance Sharin Mons)  The following physician request were entered in Epic:   Additional Comments:   Unice Bailey, LCSW  [facility]   Clinical Social Worker  (covering for Jacklynn Lewis - ph#: [phone])

## 2013-08-01 NOTE — Progress Notes (Signed)
Nutrition Brief Note  RD Consulted for Assessment for malnutrition and recommendations  Wt Readings from Last 15 Encounters:  07/30/13 182 lb 1.6 oz (82.6 kg)  06/10/13 186 lb (84.369 kg)  05/09/13 185 lb (83.915 kg)  04/25/13 190 lb (86.183 kg)  12/22/12 195 lb (88.451 kg)  12/14/12 192 lb (87.091 kg)  12/04/12 184 lb 8.4 oz (83.7 kg)  10/18/12 191 lb 11.2 oz (86.955 kg)  07/06/12 184 lb 12.8 oz (83.825 kg)  06/17/12 195 lb (88.451 kg)  06/17/12 195 lb (88.451 kg)  06/03/12 195 lb (88.451 kg)  05/22/11 194 lb 8 oz (88.225 kg)    Body mass index is 28.51 kg/(m^2). Patient meets criteria for Overweight based on current BMI. Pt states he has a good appetite and reports eating well PTA. Per daughter in the room pt refused meals for 1 day PTA but, he is eating great now. Pt reports some weight loss recently; weight history shows 2% weight loss in that past 2 months- not significant. Based on PO intake and weight loss, pt does not meet nutrition-related criteria for malnutrition.  Current diet order is Regular, patient is consuming approximately 100% of meals at this time. Labs and medications reviewed.   No nutrition interventions warranted at this time. If nutrition issues arise, please re-consult RD.   Ian Malkin RD, LDN Inpatient Clinical Dietitian Pager: [phone] After Hours Pager: [phone]

## 2013-08-02 MED ORDER — IBUPROFEN 200 MG PO TABS: 400.0000 mg | ORAL_TABLET | Freq: Four times a day (QID) | ORAL | Status: DC | PRN

## 2013-08-02 MED ORDER — ACETAMINOPHEN 325 MG PO TABS: 650.0000 mg | ORAL_TABLET | Freq: Four times a day (QID) | ORAL | Status: AC | PRN

## 2013-08-02 MED ORDER — AMLODIPINE BESYLATE 5 MG PO TABS: 5.0000 mg | ORAL_TABLET | Freq: Every day | ORAL | Status: DC

## 2013-08-02 MED ORDER — KETOCONAZOLE 2 % EX CREA
TOPICAL_CREAM | Freq: Two times a day (BID) | CUTANEOUS | Status: DC
  Administered 2013-08-02 – 2013-08-04 (×5): via TOPICAL
  Filled 2013-08-02: qty 15

## 2013-08-02 MED ORDER — ADULT MULTIVITAMIN W/MINERALS CH
1.0000 | ORAL_TABLET | Freq: Every day | ORAL | Status: DC
  Administered 2013-08-03 – 2013-08-04 (×2): 1 via ORAL
  Filled 2013-08-02 (×2): qty 1

## 2013-08-02 MED ORDER — DOXYCYCLINE HYCLATE 100 MG PO TABS
100.0000 mg | ORAL_TABLET | Freq: Two times a day (BID) | ORAL | Status: DC
  Administered 2013-08-02 – 2013-08-04 (×5): 100 mg via ORAL
  Filled 2013-08-02 (×6): qty 1

## 2013-08-02 MED ORDER — AMLODIPINE BESYLATE 5 MG PO TABS
5.0000 mg | ORAL_TABLET | Freq: Every day | ORAL | Status: DC
  Administered 2013-08-02 – 2013-08-04 (×3): 5 mg via ORAL
  Filled 2013-08-02 (×3): qty 1

## 2013-08-02 MED ORDER — ONDANSETRON HCL 4 MG PO TABS: 4.0000 mg | ORAL_TABLET | Freq: Four times a day (QID) | ORAL | Status: DC | PRN

## 2013-08-02 MED ORDER — DOXYCYCLINE HYCLATE 100 MG PO TABS: 100.0000 mg | ORAL_TABLET | Freq: Two times a day (BID) | ORAL | Status: DC

## 2013-08-02 NOTE — Progress Notes (Signed)
Physical Therapy Treatment Patient Details Name: RAMESSES CRAMPTON MRN: [id number] DOB: [birth year] Today's Date: 08/02/2013 Time: 1010-1034 PT Time Calculation (min): 24 min  PT Assessment / Plan / Recommendation  History of Present Illness HPI: BUREN HAVEY is a 79 y.o. male  admitted 12/13  with AMSm weakness, different from normal self per daughter. Pt found with mastoiditis.    PT Comments   Pt more alert and orientated.  Daughter states pt is getting back to normal.  Assisted pt OOB to amb to BR where pt cont to have loose BM's.  Assisted with amb in hallway.  Unsteady gait and impaired balance.  HIGH FALL RISK.   Follow Up Recommendations  SNF ([facility])     Does the patient have the potential to tolerate intense rehabilitation     Barriers to Discharge        Equipment Recommendations  None recommended by PT    Recommendations for Other Services    Frequency Min 3X/week   Progress towards PT Goals Progress towards PT goals: Progressing toward goals  Plan      Precautions / Restrictions Precautions Precautions: Fall    Pertinent Vitals/Pain No c/o pain    Mobility  Bed Mobility Bed Mobility: Supine to Sit;Sitting - Scoot to Edge of Bed Supine to Sit: 4: Min assist;4: Min guard Sitting - Scoot to [place] of Bed: 4: Min guard;4: Min assist Details for Bed Mobility Assistance: increased time Transfers Transfers: Sit to Stand;Stand to Sit Sit to Stand: 4: Min assist;From bed;From toilet Stand to Sit: 4: Min assist;To toilet;To chair/3-in-1 Details for Transfer Assistance: 25% VC's on proper hand placement and increased time Ambulation/Gait Ambulation/Gait Assistance: 4: Min assist;3: Mod assist Ambulation Distance (Feet): 72 Feet Assistive device: Rolling walker Ambulation/Gait Assistance Details: 50% VC's on proper upright posture and walker to self distance as pt tends to push AD out too far to front.  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  Gait  Pattern: Step-through pattern;Decreased stride length;Shuffle Gait velocity: decreased     PT Goals (current goals can now be found in the care plan section)    Visit Information  Last PT Received On: 08/02/13 Assistance Needed: +1 History of Present Illness: HPI: ARMEL RABBANI is a 79 y.o. male  admitted 12/13  with AMSm weakness, different from normal self per daughter. Pt found with mastoiditis.     Subjective Data      Cognition       Balance     End of Session PT - End of Session Equipment Utilized During Treatment: Gait belt Activity Tolerance: Patient tolerated treatment well Patient left: in chair;with call bell/phone within reach;with family/visitor present   Felecia Shelling  PTA [facility]  Acute  Rehab Pager      [phone]

## 2013-08-02 NOTE — Care Management Note (Signed)
   CARE MANAGEMENT NOTE 08/02/2013  Patient:  Marvin [name], Marvin [name]   Account Number:  [id number]  Date Initiated:  07/31/2013  Documentation initiated by:  Lanier Clam  Subjective/Objective Assessment:   79 y/o m admitted w/ams.     Action/Plan:   SNF   Anticipated DC Date:  08/03/2013   Anticipated DC Plan:  ASSISTED LIVING / REST HOME  In-house referral  Clinical Social Worker      DC Planning Services  CM consult      Choice offered to / List presented to:  C-1 Patient   DME arranged  NA      DME agency  NA     HH arranged  NA      HH agency  NA   Status of service:  Completed, signed off Medicare Important Message given?   (If response is "NO", the following Medicare IM given date fields will be blank) Date Medicare IM given:   Date Additional Medicare IM given:    Discharge Disposition:    Per UR Regulation:  Reviewed for med. necessity/level of care/duration of stay  If discussed at Long Length of Stay Meetings, dates discussed:    Comments:  08/02/13 1307 Roland Earl [phone] Pt recommendations for SNF. Pt will not return to [facility]. CSW following. Cm to sign off.  Sharmon Leyden, RN Registered Nurse Signed CASE MANAGEMENT Care Management Note Service date: 08/01/2013 11:09 AM Cm spoke with patient and adult daughter at bedside concerning discharge planning. Pt resides at [facility]. Pt had previously lived independently. Per pt may need to hire private duty care vs transitioning to SNF. Pt eval ordered. Cm provided patient and daughter with Home Health choice list and Private duty care list. Per pt has utilized [facility] and [facility] in the past. Will continue to monitor.

## 2013-08-02 NOTE — Progress Notes (Signed)
CSW met with pt and pt daughter, Vernona Rieger to discuss pt discharge planning needs.   CSW provided SNF bed offers and clarified pt questions and concerns.   Pt and pt daughter chose [facility] for SNF placement.   CSW spoke to [facility] who confirmed that facility could offer pt a bed.  CSW contacted pt insurance, [employer] who stated that clinicals have been received and determination for authorization has not yet been made. CSW notified [employer] that pt has chosen [facility].  CSW to await insurance authorization and facilitate pt discharge needs when insurance authorization is received.    Jacklynn Lewis, MSW, LCSWA  Clinical Social Work [phone]

## 2013-08-02 NOTE — Progress Notes (Signed)
ANTIBIOTIC CONSULT NOTE - Follow-up  Pharmacy Consult for Vancomycin, Zosyn Indication: Mastoiditis  Allergies  Allergen Reactions  . Hydrocodone     unknown    Patient Measurements: Height: 5\' 7"  (170.2 cm) Weight: 182 lb 1.6 oz (82.6 kg) IBW/kg (Calculated) : 66.1  Vital Signs: Temp: 97.8 F (36.6 C) (12/16 0408) Temp src: Oral (12/16 0408) BP: 175/71 mmHg (12/16 0408) Pulse Rate: 84 (12/16 0408) Intake/Output from previous day: 12/15 0701 - 12/16 0700 In: 770 [P.O.:600; I.V.:120; IV Piggyback:50] Out: 775 [Urine:775]  Labs:  Recent Labs  07/30/13 1729 07/31/13 0437  WBC 11.6* 10.0  HGB 9.5* 8.0*  PLT 251 209  CREATININE 1.64* 1.57*   Estimated Creatinine Clearance: 27.7 ml/min (by C-G formula based on Cr of 1.57).  Assessment: 80 yoM with hx myasthenia gravis, CKD, HTN, HLD, CAD, PAD, and hx of CVA admitted with AMS. CT head showed chronic MCA distribution infarction on the right and calcifications likely reflecting calcified meningiomas number for the sequela of mastoiditis on the right could not be excluded.  Pt also with LE cellulitis. Pharmacy consulted to dose empiric vancomycin and zosyn for mastoiditis vs cellulitis.  Aiming for higher trough due to possible mastoiditis indication.   D#4 Antibiotics 12/13 >> Vancomycin >> 12/13 >> Zosyn >>  Microbiology: 12/13 MRSA PCR: negative  No other cultures  Tm24h: afebrile Renal: AoCKD: Baseline SCr ~1.3. SCr slightly improved 1.57 (12/14) with CrCl ~ 28 CG (27N), UOP not accurately charted but looks adequate WBC: 10.0  Goal of Therapy:  Vancomycin trough level 15-20 mcg/ml  Plan:  - continue Vancomycin 1g IV q 24 h - continue Zosyn 3.375g IV q 8 hours (extended infusion) - BMET in AM - vancomycin trough 12/17 at 2130 prior to 4th dose - F/u renal function, clinical course  Thank you for the consult.  Tomi Bamberger, PharmD, BCPS Clinical Pharmacist Pager: [phone] Pharmacy:  [phone] 08/02/2013 7:59 AM

## 2013-08-02 NOTE — Discharge Summary (Addendum)
Physician Discharge Summary  Marvin [name] ZOX:[id number] DOB: 1913-11-06 DOA: 07/30/2013  PCP: [name], MD  Admit date: 07/30/2013 Discharge date: 08/02/2013  Recommendations for Outpatient Follow-up:  1. Please continue taking doxycycline 100 mg twice daily for 7 days on discharge for lower extremity cellulitis. No need for additional antibiotics. Please note that patient has received vancomycin and Zosyn for 4 days. This would have treated possible mastoiditis which was identified on CT head. 2. Please note wound care instructions below 3. Please stop using HCTZ as it may have contributed to worsening of renal function. You may continue taking Lasix as before. Please recheck BMP in next couple of days from discharge to make sure kidney function remained stable. Creatinine improved since the admission, 1.28 at the time of discharge. 4. Hemoglobin was 8 at the time of discharge. Patient did not require blood transfusions throughout this hospital stay.  Discharge wound care:  As directed   Comments:    Pt with generalized edema and erythremia to both calves and feet.   Wound type: Left leg with small amt yellow weeping drainage.  No open wound or odor. Measurement: Right posterior calf with patchy areas of partial thickness skin loss, affected area approx 4X4X.1cm. Wound bed: red and moist Drainage (amount, consistency, odor) Large amt yellow drainage, no odor Dressing procedure/placement/frequency: Applied foam dressing to right leg to protect, absorb drainage, and promote healing.  ABD pads to both legs with ace wrap to provide light compression.    Discharge Diagnoses:  Principal Problem:   Acute encephalopathy Active Problems:   Acute respiratory failure with hypoxia   Peripheral arterial disease   Hypertension   Lung cancer, upper lobe   CKD (chronic kidney disease)   BPH (benign prostatic hyperplasia)   Dyslipidemia   Mastoiditis   Myasthenia gravis   Altered  mental status   Discharge Condition: Medically stable for discharge today  Diet recommendation: as tolerated   History of present illness:  79 y.o. male with past medical history of myasthenia gravis, chronic kidney disease, hypertension, hyperlipidemia, history of CVA who presented to [facility] ED 07/30/2013 with worsening confusion, altered mental status and poor PO intake for past few days prior to this admission.  In the ED patient had CT of head which showed chronic MCA distribution infarction on the right and calcifications likely reflecting calcified meningiomas and likely sequela of mastoiditis on the right.   Assessment/Plan:   Principal Problem:  Acute encephalopathy  - perhaps dementia, delirium or due to an acute infection/mastoiditis/cellulitis of LE  - continue treatment with vanco and zosyn in hospital but change to doxycyline on discharge  - PT evaluation- recommended skilled nursing facility. Active Problems:  Acute respiratory failure with hypoxia  - unclear etiology  - oxygen saturation was 89% on room air on the admission but now with nasal canula oxygen support oxygen saturation is good, 92% - 100%  Mastoiditis  - on vancomycin and zosyn. No need to treat this beyond today. Has received 4 days of broad-spectrum antibiotics Hypertension  - Blood pressure high this morning, systolic blood pressure around 170 so we will restart Norvasc, will increase to 5 mg daily LE edema, cellulitis  - LLE more than right  - Continue Lasix per home regimen - on vancomycin but we will change this to doxycycline and patient will continue this for 7 more days on discharge CKD (chronic kidney disease), stage 3  - baseline creatinine 1.6  - creatinine on this admission was 1.64 so around  his baseline  BPH (benign prostatic hyperplasia)  - continue flomax  Dyslipidemia  - continue statin therapy  Myasthenia gravis  - on steroid, low dose 5 mg TID  - continue pyridostigmine   Code  Status: full code  Family Communication: family at the bedsdie   Consultants:  Wound care Procedures:  None  Antibiotics:  Vancomycin 07/30/2013 --> 08/02/2013 Zosyn 07/30/2013 --> 08/02/2013 Doxycyline on discharge for 7 days    Signed:  Manson Passey, MD  [facility] 08/02/2013, 11:49 AM  Pager #: [phone]   Discharge Exam: Filed Vitals:   08/02/13 0408  BP: 175/71  Pulse: 84  Temp: 97.8 F (36.6 C)  Resp: 16   Filed Vitals:   08/01/13 0540 08/01/13 1400 08/01/13 2254 08/02/13 0408  BP: 127/61 113/51 157/70 175/71  Pulse: 50 79 103 84  Temp: 98.3 F (36.8 C) 97.8 F (36.6 C) 98 F (36.7 C) 97.8 F (36.6 C)  TempSrc: Oral Oral Oral Oral  Resp: 16 16 16 16   Height:      Weight:      SpO2: 92% 94% 94% 92%    Exam:  General: Pt is alert, follows commands appropriately, not in acute distress  Cardiovascular: bradycardic, S1/S2 appreciated  Respiratory: Clear to auscultation bilaterally, no wheezing, no crackles, no rhonchi  Abdomen: Soft, non tender, non distended, bowel sounds present, no guarding  Extremities: LE +1-2 pitting edema, redness or LLE more pronounced edema, pulses DP and PT palpable bilaterally  Neuro: Grossly nonfocal   Discharge Instructions  Discharge Orders   Future Appointments Provider Department Dept Phone   09/09/2013 10:30 AM Myeong Eyvonne Left [facility] [phone]   10/24/2013 1:00 PM Billie Lade, MD [facility] [phone]   11/14/2013 8:00 AM York Spaniel, MD [facility] [phone]   Future Orders Complete By Expires   Call MD for:  difficulty breathing, headache or visual disturbances  As directed    Call MD for:  persistant dizziness or light-headedness  As directed    Call MD for:  persistant nausea and vomiting  As directed    Call MD for:  severe uncontrolled pain  As directed    Diet - low sodium heart healthy  As directed    Discharge  instructions  As directed    Comments:     1. Please continue taking doxycycline 100 mg twice daily for 7 days on discharge for lower extremity cellulitis. No need for additional antibiotics. Please note that patient has received vancomycin and Zosyn for 4 days. This would have treated possible mastoiditis which was identified on CT head. 2. Please note wound care instructions below 3. Please stop using HCTZ as it may have contributed to worsening of renal function. You may continue taking Lasix as before. Please recheck BMP in next couple of days from discharge to make sure kidney function remained stable. Creatinine improved since the admission, 1.57 at the time of discharge. 4. Hemoglobin was 8 at the time of discharge. Patient did not require blood transfusions throughout this hospital stay.   Discharge wound care:  As directed    Comments:     Pt with generalized edema and erythremia to both calves and feet.   Wound type: Left leg with small amt yellow weeping drainage.  No open wound or odor. Measurement: Right posterior calf with patchy areas of partial thickness skin loss, affected area approx 4X4X.1cm. Wound bed: red and moist Drainage (amount, consistency, odor) Large  amt yellow drainage, no odor Dressing procedure/placement/frequency: Applied foam dressing to right leg to protect, absorb drainage, and promote healing.  ABD pads to both legs with ace wrap to provide light compression.   Increase activity slowly  As directed        Medication List    STOP taking these medications       hydrochlorothiazide 25 MG tablet  Commonly known as:  HYDRODIURIL      TAKE these medications       acetaminophen 325 MG tablet  Commonly known as:  TYLENOL  Take 2 tablets (650 mg total) by mouth every 6 (six) hours as needed for mild pain (or Fever >/= 101).     amLODipine 5 MG tablet  Commonly known as:  NORVASC  Take 1 tablet (5 mg total) by mouth daily.     aspirin 81 MG chewable  tablet  Chew 81 mg by mouth every morning.     calcium carbonate 600 MG Tabs tablet  Commonly known as:  OS-CAL  Take 600 mg by mouth daily with lunch.     doxycycline 100 MG tablet  Commonly known as:  VIBRA-TABS  Take 1 tablet (100 mg total) by mouth every 12 (twelve) hours.     furosemide 20 MG tablet  Commonly known as:  LASIX  Take 20-40 mg by mouth every morning. Takes and additional  40 mg in the evening as needed     ibuprofen 200 MG tablet  Commonly known as:  ADVIL,MOTRIN  Take 2 tablets (400 mg total) by mouth every 6 (six) hours as needed.     multivitamin with minerals Tabs tablet  Take 1 tablet by mouth every morning.     ondansetron 4 MG tablet  Commonly known as:  ZOFRAN  Take 1 tablet (4 mg total) by mouth every 6 (six) hours as needed for nausea.     predniSONE 5 MG tablet  Commonly known as:  DELTASONE  Take 1 tablet (5 mg total) by mouth 3 (three) times daily.     pyridostigmine 60 MG tablet  Commonly known as:  MESTINON  Take 30 mg by mouth 3 (three) times daily.     simvastatin 40 MG tablet  Commonly known as:  ZOCOR  Take 40 mg by mouth at bedtime.     tamsulosin 0.4 MG Caps capsule  Commonly known as:  FLOMAX  Take 0.4 mg by mouth at bedtime.     Vitamin D (Ergocalciferol) 50000 UNITS Caps capsule  Commonly known as:  DRISDOL  Take 50,000 Units by mouth every 7 (seven) days. On Sundays           Follow-up Information   Follow up with [name], MD. Schedule an appointment as soon as possible for a visit in 1 week.   Specialty:  Internal Medicine   Contact information:   [address] [place] [address] [phone]        The results of significant diagnostics from this hospitalization (including imaging, microbiology, ancillary and laboratory) are listed below for reference.    Significant Diagnostic Studies: Dg Chest 2 View 07/30/2013    IMPRESSION: Chronic changes without evidence of acute  cardiopulmonary disease.   Electronically Signed   By: Salome Holmes M.D.   On: 07/30/2013 17:23   Ct Head Wo Contrast 07/30/2013   IMPRESSION: 1. 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 2. Involutional and chronic changes. 3. Dystrophic appearing regions of calcification likely  reflecting calcified meningiomas number for the sequela of mastoiditis on the right cannot be excluded. If clinically appropriate 4. Otherwise no evidence of acute abnormalities.   Electronically Signed   By: Salome Holmes M.D.   On: 07/30/2013 17:59    Microbiology: Recent Results (from the past 240 hour(s))  MRSA PCR SCREENING     Status: None   Collection Time    07/30/13 11:30 PM      Result Value Range Status   MRSA by PCR NEGATIVE  NEGATIVE Final     Labs: Basic Metabolic Panel:  Recent Labs Lab 07/30/13 1729 07/31/13 0437  NA 134* 137  K 4.2 3.5  CL 95* 101  CO2 28 29  GLUCOSE 115* 102*  BUN 47* 42*  CREATININE 1.64* 1.57*  CALCIUM 9.2 8.3*   Liver Function Tests:  Recent Labs Lab 07/30/13 1729  AST 71*  ALT 77*  ALKPHOS 81  BILITOT 1.1  PROT 6.2  ALBUMIN 2.9*   No results found for this basename: LIPASE, AMYLASE,  in the last 168 hours No results found for this basename: AMMONIA,  in the last 168 hours CBC:  Recent Labs Lab 07/30/13 1729 07/31/13 0437  WBC 11.6* 10.0  NEUTROABS 9.9*  --   HGB 9.5* 8.0*  HCT 28.8* 24.3*  MCV 93.5 92.7  PLT 251 209   Cardiac Enzymes: No results found for this basename: CKTOTAL, CKMB, CKMBINDEX, TROPONINI,  in the last 168 hours BNP: BNP (last 3 results)  Recent Labs  07/30/13 1729  PROBNP 1356.0*   CBG: No results found for this basename: GLUCAP,  in the last 168 hours  Time coordinating discharge: Over 30 minutes  Discharge summary originally by Dr. Elisabeth Pigeon on 08/02/2013.  No changes in interval, patient to be discharged today.  Yutaka Holberg M. Elvera Lennox, MD [facility] [phone]

## 2013-08-02 NOTE — Progress Notes (Signed)
CSW continuing to await insurance authorization through [facility]  CSW has spoken to [facility] who stated that insurance authorization is pending.   Pt daughter also contacted pt insurance company and insurance company notified pt daughter that insurance needed discharge summary. CSW sent discharge summary via [employer].   CSW contacted pt insurance company and notified pt insurance that discharge summary was sent and was informed that authorization continues to be pending.   CSW to continue to attempt to communicate with pt insurance authorization in order to obtain insurance authorization for SNF placement.  Jacklynn Lewis, MSW, LCSWA  Clinical Social Work [phone]

## 2013-08-03 LAB — BASIC METABOLIC PANEL
BUN: 26 mg/dL — ABNORMAL HIGH (ref 6–23)
CO2: 23 mEq/L (ref 19–32)
Chloride: 105 mEq/L (ref 96–112)
GFR calc Af Amer: 52 mL/min — ABNORMAL LOW (ref >90)
GFR calc non Af Amer: 45 mL/min — ABNORMAL LOW (ref >90)
Potassium: 4.1 mEq/L (ref 3.5–5.1)
Sodium: 138 mEq/L (ref 135–145)

## 2013-08-03 NOTE — Progress Notes (Signed)
CSW has made a valiant effort to receive insurance authorization.  [employer] continues to be a barrier to discharge and has not yet provided insurance authorization even after making multiple phone calls to [employer].   Pt is not safe to return home as pt was residing in independent living and funds are not available to hire 24 hour care or pay privately for placement.  CSW updated pt and pt daughter at bedside.  CSW updated MD.  CSW will contact pt insurance first thing tomorrow morning to continue efforts to receive insurance authorization.  Jacklynn Lewis, MSW, LCSW Clinical Social Work [phone]

## 2013-08-03 NOTE — Progress Notes (Signed)
CSW continuing to follow.  Current barrier to discharge is authorization for SNF placement from pt insurance.  CSW contacted pt insurance company this morning and was notified that insurance company has all the information needed in order to process request, but customer service person was unable to provide a time frame in which the authorization would be issued.   Pt daughter also called pt insurance, [employer] and spoke with a supervisor who stated that authorization request had to be requested to be expedited. Pt daughter requested for pt insurance to expedite request.  CSW contacted [facility] Medicare again to ensure insurance had all needed documentation and that the request was expedited. Per [employer], insurance company has all the clinicals needed and [employer] authorization request is marked as expedited, but customer support was unable to notify this CSW of a time frame to anticipate having the authorization.   CSW spoke to [facility] who has also been in discussion with [employer] and [facility] admission coordinator was notified that authorization request is being expedited and is awaiting to be reviewed.   CSW discussed with pt and pt daughter at bedside. CSW provided support to pt daughter as pt daughter discussed her frustration with pt insurance company at this time given that CSW submitted information on Monday for SNF authorization. Pt daughter is very concerned as it is not a safe discharge plan to discharge pt back home at this time.   CSW to continue to follow up with pt insurance regarding insurance authorization for SNF.   CSW to facilitate pt discharge needs when insurance authorization received.   Jacklynn Lewis, MSW, LCSWA  Clinical Social Work [phone]

## 2013-08-03 NOTE — Progress Notes (Signed)
Awaiting insurance authorization today. No changes.   Costin M. Elvera Lennox, MD [facility] [phone]

## 2013-08-04 MED ORDER — DEXTROSE 50 % IV SOLN
INTRAVENOUS | Status: AC
  Filled 2013-08-04: qty 50

## 2013-08-04 NOTE — Progress Notes (Signed)
CSW contacted pt insurance, [employer] regarding insurance authorization.  CSW spoke to a team leader with [employer], Lamar Laundry. Team Leader was able to expedite request and provide this CSW with insurance authorization to [facility].   CSW notified MD and RN.  CSW discussed with pt at bedside and will discuss with pt daughter when she arrives to the hospital.  CSW to facilitate pt discharge to [facility] today.  Jacklynn Lewis, MSW, LCSWA  Clinical Social Work [phone]

## 2013-08-04 NOTE — Progress Notes (Signed)
Pt for discharge to [facility].  CSW facilitated pt discharge needs including contacting facility, faxing pt discharge information via [facility], discussing with pt and pt daughter at bedside, providing RN phone number to call report, and arranging ambulance transportation for pt to [facility].  CSW discussed with pt daughter that if pt daughter wanted to express her concerns about the delay in pt discharge due to insurance authorization then the best thing to do would be contact the [employer]. CSW provided pt daughter with the phone number.  No further social work needs identified at this time.  CSW signing off.   Jacklynn Lewis, MSW, LCSW  Clinical Social Work [phone]

## 2013-08-05 ENCOUNTER — Non-Acute Institutional Stay (SKILLED_NURSING_FACILITY): Payer: Medicare Other | Admitting: Internal Medicine

## 2013-08-05 DIAGNOSIS — I150 Renovascular hypertension: Secondary | ICD-10-CM

## 2013-08-05 DIAGNOSIS — G7000 Myasthenia gravis without (acute) exacerbation: Secondary | ICD-10-CM

## 2013-08-05 DIAGNOSIS — N400 Enlarged prostate without lower urinary tract symptoms: Secondary | ICD-10-CM

## 2013-08-09 ENCOUNTER — Encounter: Payer: Self-pay | Admitting: Internal Medicine

## 2013-08-09 DIAGNOSIS — I150 Renovascular hypertension: Secondary | ICD-10-CM | POA: Insufficient documentation

## 2013-08-09 NOTE — Progress Notes (Signed)
HISTORY & PHYSICAL  DATE: 08/05/2013   FACILITY: [facility]  LEVEL OF CARE: SNF (31)  ALLERGIES:  Allergies  Allergen Reactions  . Hydrocodone     unknown    CHIEF COMPLAINT:  Manage hypertension, chronic kidney disease stage III and BPH  HISTORY OF PRESENT ILLNESS: Patient is a 79 year old Caucasian male who was hospitalized for acute encephalopathy. After hospitalization he is admitted to this facility for short-term rehabilitation.  HTN: Pt 's HTN remains stable.  Denies CP, sob, DOE, headaches, dizziness or visual disturbances.  No complications from the medications currently being used.  Last BP : 124/58  BPH: The patient's BPH remains stable. Patient denies urinary hesitancy or dribbling. No complications reported from the current medications being used.  CHRONIC KIDNEY DISEASE: The patient's chronic kidney disease remains stable.  Patient denies increasing lower extremity swelling or confusion. Last BUN and creatinine are: 42 and 1.57.  PAST MEDICAL HISTORY :  Past Medical History  Diagnosis Date  . Hyperlipidemia   . Hypertension   . Dizziness   . Blood clot in vein   . Arthritis   . Peripheral arterial disease   . Carotid artery occlusion   . Ocular myasthenia gravis   . BPH (benign prostatic hyperplasia)   . Nephrolithiasis   . Low back pain   . Gait disorder   . Hx of radiation therapy 07/06/12, 07/08/12, 07/13/12    left upper lung nodule, 3 fractions  . Lung cancer 05/2012    left  . Hearing difficulty   . Lumbago   . Abnormality of gait 05/09/2013  . Hearing deficit     right hearing aid  . Peripheral edema     PAST SURGICAL HISTORY: Past Surgical History  Procedure Laterality Date  . Appendectomy    . Cataract extraction      bilateral  . Retinal detachment surgery    . Thyroidectomy, partial      SOCIAL HISTORY:  reports that he has quit smoking. His smoking use included Cigarettes. He has a 60 pack-year smoking  history. He has never used smokeless tobacco. He reports that he does not drink alcohol or use illicit drugs.  FAMILY HISTORY:  Family History  Problem Relation Age of Onset  . Cancer Mother   . Pneumonia Father   . Stroke Sister   . Hypertension Sister   . Heart disease Sister     CURRENT MEDICATIONS: Reviewed per [facility]  REVIEW OF SYSTEMS:  See HPI otherwise 14 point ROS is negative.  PHYSICAL EXAMINATION  VS:  T 96.8       P 78      RR 18      BP 124/58       POX%  98        GENERAL: no acute distress, normal body habitus EYES: conjunctivae normal, sclerae normal, normal eye lids MOUTH/THROAT: lips without lesions,no lesions in the mouth,tongue is without lesions,uvula elevates in midline NECK: supple, trachea midline, no neck masses, no thyroid tenderness, no thyromegaly LYMPHATICS: no LAN in the neck, no supraclavicular LAN RESPIRATORY: breathing is even & unlabored, BS CTAB CARDIAC: RRR, no murmur,no extra heart sounds, bilateral lower extremities edematous and wrapped, pedal pulses nonpalpable GI:  ABDOMEN: abdomen soft, normal BS, no masses, no tenderness  LIVER/SPLEEN: no hepatomegaly, no splenomegaly MUSCULOSKELETAL: HEAD: normal to inspection & palpation BACK: no kyphosis, scoliosis or spinal processes tenderness EXTREMITIES: LEFT UPPER EXTREMITY: full range of motion, normal strength & tone RIGHT  UPPER EXTREMITY:  full range of motion, normal strength & tone LEFT LOWER EXTREMITY:  Moderate range of motion, normal strength & tone RIGHT LOWER EXTREMITY:  Moderate range of motion, normal strength & tone PSYCHIATRIC: the patient is alert & oriented to person, affect & behavior appropriate  LABS/RADIOLOGY:  Labs reviewed: Basic Metabolic Panel:  Recent Labs  21/30/86 1729 07/31/13 0437 08/03/13 0350  NA 134* 137 138  K 4.2 3.5 4.1  CL 95* 101 105  CO2 28 29 23   GLUCOSE 115* 102* 127*  BUN 47* 42* 26*  CREATININE 1.64* 1.57* 1.28  CALCIUM 9.2 8.3* 8.7    Liver Function Tests:  Recent Labs  04/13/13 0355 07/30/13 1729  AST 25 71*  ALT 35 77*  ALKPHOS 77 81  BILITOT 0.4 1.1  PROT 5.5* 6.2  ALBUMIN 2.9* 2.9*    CBC:  Recent Labs  12/05/12 1117 04/13/13 0355 07/30/13 1729 07/31/13 0437  WBC  --  8.6 11.6* 10.0  NEUTROABS  --  6.8 9.9*  --   HGB 9.4* 10.0* 9.5* 8.0*  HCT 27.2* 30.3* 28.8* 24.3*  MCV  --  91.8 93.5 92.7  PLT  --  183 251 209    CBG:  Recent Labs  12/04/12 0033 12/04/12 0607 12/04/12 1228  GLUCAP 119* 130* 97   CLINICAL DATA:  Altered mental status, leg swelling.   EXAM: CHEST  2 VIEW   COMPARISON:  Correlated with chest CT dated 04/28/2013   FINDINGS: Low lung volumes. Cardiac silhouette is enlarged. An area of increased density is appreciated within the left lung base consistent with previously described postradiation changes. No new focal regions of consolidation are new focal infiltrates identified. The osseous structures unremarkable.   IMPRESSION: Chronic changes without evidence of acute cardiopulmonary disease.     CLINICAL DATA:  Altered mental status   EXAM: CT HEAD WITHOUT CONTRAST   TECHNIQUE: Contiguous axial images were obtained from the base of the skull through the vertex without intravenous contrast.   COMPARISON:  None.   FINDINGS: A focal partially wedge-shaped area of low attenuation projects within the posterior lateral aspect of the right frontal lobe. Finding has the appearance of a chronic region of the MCA distribution infarction. There is diffuse cortical atrophy and diffuse areas of low attenuation within the subcortical, deep, and periventricular white matter regions. Cerebellar atrophy is identified. Punctate areas of low attenuation projects within the peripheral medial aspect of the left cerebellar hemisphere. The ventricles and cisterns are patent. There is no evidence of mass effect. A coarse area of dystrophic appearing calcification  projects within the central base of the interhemispheric fissure region of the frontal lobe. Finding likely represents sequela of a calcified meningioma. The patient has a history of lung neoplasia and an area of calcified metastatic disease or treated metastatic disease cannot be excluded, there is no appreciable surrounding edema nor mass effect producing differential consideration. A 2nd course calcified patient is identified along the posterior vertex of the left frontal lobe again likely representing a calcified meningioma. Mild hydrocephalus ex vacuo is appreciated. There is no evidence of intra-axial no extra-axial fluid collections no evidence of acute hemorrhage. The osseous structures demonstrate no evidence of a depressed skull fracture. The visualized paranasal sinuses are patent. There are consolidative foci within the right there are areas of soft tissue attenuation within the mastoid air cells on the right. The mastoid air cells on the left are patent.   IMPRESSION: 1. 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 2. Involutional and chronic changes. 3. Dystrophic appearing regions of calcification likely reflecting calcified meningiomas number for the sequela of mastoiditis on the right cannot be excluded. If clinically appropriate 4. Otherwise no evidence of acute abnormalities.   ASSESSMENT/PLAN:  Chronic kidney disease stage III-reassess Renovascular hypertension-well-controlled BPH-continue Flomax Myasthenia gravis-continue pyridostigmine and steroid Hyperlipidemia-continue statin Mastoiditis-currently on doxycycline Check CBC and CMP  I have reviewed patient's medical records received at admission/from hospitalization.  CPT CODE: 46962

## 2013-08-14 ENCOUNTER — Other Ambulatory Visit: Payer: Self-pay | Admitting: Neurology

## 2013-08-30 DIAGNOSIS — N183 Chronic kidney disease, stage 3 unspecified: Secondary | ICD-10-CM

## 2013-08-30 DIAGNOSIS — M6281 Muscle weakness (generalized): Secondary | ICD-10-CM

## 2013-08-30 DIAGNOSIS — M545 Low back pain, unspecified: Secondary | ICD-10-CM

## 2013-08-30 DIAGNOSIS — I129 Hypertensive chronic kidney disease with stage 1 through stage 4 chronic kidney disease, or unspecified chronic kidney disease: Secondary | ICD-10-CM

## 2013-08-30 DIAGNOSIS — R269 Unspecified abnormalities of gait and mobility: Secondary | ICD-10-CM

## 2013-08-30 DIAGNOSIS — G7000 Myasthenia gravis without (acute) exacerbation: Secondary | ICD-10-CM

## 2013-09-09 ENCOUNTER — Ambulatory Visit (INDEPENDENT_AMBULATORY_CARE_PROVIDER_SITE_OTHER): Payer: Medicare Other | Admitting: Podiatry

## 2013-09-09 ENCOUNTER — Encounter: Payer: Self-pay | Admitting: Podiatry

## 2013-09-09 DIAGNOSIS — M79675 Pain in left toe(s): Secondary | ICD-10-CM

## 2013-09-09 DIAGNOSIS — B351 Tinea unguium: Secondary | ICD-10-CM | POA: Insufficient documentation

## 2013-09-09 NOTE — Patient Instructions (Signed)
Seen for hypertrophic nails. Noted of excess swelling on both lower limbs. Make sure to put on compression socks daily.  All nails debrided. Return in 3 months or as needed.

## 2013-09-09 NOTE — Progress Notes (Signed)
80 year old male presents using a walker accompanied by a care taker requesting toe nails trimmed.   Objective: Edema on both lower limbs. Thick hypertrophic nails x 10. Pedal pulses are not palpable.  Dry scaly skin bilateral foot.  Assessment: PVD. Onychomycosis x 10. Pain from thick toe nails.   Plan: Debrided all nails.

## 2013-09-29 ENCOUNTER — Other Ambulatory Visit: Payer: Self-pay | Admitting: Neurology

## 2013-10-24 ENCOUNTER — Ambulatory Visit
Admission: RE | Admit: 2013-10-24 | Discharge: 2013-10-24 | Disposition: A | Discharge status: Home / Self Care | Payer: Medicare Other | Source: Ambulatory Visit | Attending: Radiation Oncology | Admitting: Radiation Oncology

## 2013-10-24 ENCOUNTER — Encounter: Payer: Self-pay | Admitting: Radiation Oncology

## 2013-10-24 VITALS — BP 146/57 | HR 76 | Temp 97.3°F | Ht 67.0 in | Wt 185.4 lb

## 2013-10-24 DIAGNOSIS — C3410 Malignant neoplasm of upper lobe, unspecified bronchus or lung: Secondary | ICD-10-CM

## 2013-10-24 NOTE — Progress Notes (Signed)
Radiation Oncology         ([phone]) [phone] ________________________________  Name: Marvin [name] MRN: [id number]  Date: 10/24/2013  DOB: Jun 04, 1914  Follow-Up Visit Note  CC: Thressa Sheller, MD  Thressa Sheller, MD  Diagnosis:   Clinical stage I non-small cell lung cancer  Interval Since Last Radiation:  One year and 3 months   Narrative:  The patient returns today for routine follow-up.  The patient denies any pain in the chest area significant cough or hemoptysis. He denies any breathing problems. Interval history is significant for the patient being admitted to [facility] in December of last year for a sinus infection and peripheral edema.                              ALLERGIES:  is allergic to hydrocodone.  Meds: Current Outpatient Prescriptions  Medication Sig Dispense Refill  . acetaminophen (TYLENOL) 325 MG tablet Take 2 tablets (650 mg total) by mouth every 6 (six) hours as needed for mild pain (or Fever >/= 101).  30 tablet  0  . amLODipine (NORVASC) 5 MG tablet Take 1 tablet (5 mg total) by mouth daily.  30 tablet  0  . aspirin 81 MG chewable tablet Chew 81 mg by mouth every morning.       . furosemide (LASIX) 20 MG tablet Take 20-40 mg by mouth every morning. Takes and additional  40 mg in the evening as needed      . hydrochlorothiazide (HYDRODIURIL) 25 MG tablet Take 12.5 mg by mouth daily. 1/2 tablet      . ketoconazole (NIZORAL) 2 % cream Apply 1 application topically daily.      . Multiple Vitamin (MULTIVITAMIN WITH MINERALS) TABS Take 1 tablet by mouth every morning.       . predniSONE (DELTASONE) 5 MG tablet TAKE 1 TABLET BY MOUTH THREE TIMES DAILY  270 tablet  1  . pyridostigmine (MESTINON) 60 MG tablet Take 30 mg by mouth 3 (three) times daily.       . simvastatin (ZOCOR) 40 MG tablet Take 40 mg by mouth at bedtime.        . Tamsulosin HCl (FLOMAX) 0.4 MG CAPS Take 0.4 mg by mouth at bedtime.      . Vitamin D, Ergocalciferol, (DRISDOL) 50000 UNITS CAPS Take  50,000 Units by mouth every 7 (seven) days. On Sundays      . doxycycline (VIBRA-TABS) 100 MG tablet Take 1 tablet (100 mg total) by mouth every 12 (twelve) hours.  14 tablet  0  . ondansetron (ZOFRAN) 4 MG tablet Take 1 tablet (4 mg total) by mouth every 6 (six) hours as needed for nausea.  20 tablet  0   No current facility-administered medications for this encounter.    Physical Findings: The patient is in no acute distress. Patient is alert and oriented.  height is 5\' 7"  (1.702 m) and weight is 185 lb 6.4 oz (84.097 kg). His temperature is 97.3 F (36.3 C). His blood pressure is 146/57 and his pulse is 76. His oxygen saturation is 96%. .  No palpable supraclavicular or axillary adenopathy. The lungs are clear to auscultation. The heart has a regular rhythm and rate.  Lab Findings: Lab Results  Component Value Date   WBC 10.0 07/31/2013   HGB 8.0* 07/31/2013   HCT 24.3* 07/31/2013   MCV 92.7 07/31/2013   PLT 209 07/31/2013      Radiographic Findings:  No results found.  Impression: Stage I non-small cell lung cancer status post SBRT  Plan:  A chest CT scan will be ordered for followup of his early stage lung cancer. Routine followup in 6 months.  ____________________________________ Blair Promise, MD

## 2013-10-24 NOTE — Progress Notes (Signed)
Marvin [name] here for follow up after treatment to his left upper lung.  He denies pain today but has muscle soreness in both shoulders from past falls.  He currently lives at [facility].  He denies shortness of breath, cough and hemoptysis.  He denies fatigue.  He reports weakness and dizzy spells.  He was admitted to [facility] in December with a sinus infection and peripheral edema.  He does have cellulitis in both legs.  He is wearing TED hose.  He reports rough/irritated skin on his toes.  He sees a podiatrist once a month.  He reports on and off swelling in both arms.

## 2013-10-25 ENCOUNTER — Encounter: Payer: Self-pay | Admitting: Neurology

## 2013-10-25 ENCOUNTER — Telehealth: Payer: Self-pay | Admitting: *Deleted

## 2013-10-25 NOTE — Telephone Encounter (Signed)
Called patient to inform of test, lvm for a return call

## 2013-10-28 ENCOUNTER — Ambulatory Visit: Admission: RE | Admit: 2013-10-28 | Payer: Medicare Other | Source: Ambulatory Visit

## 2013-10-28 ENCOUNTER — Encounter (HOSPITAL_COMMUNITY): Payer: Self-pay

## 2013-10-28 ENCOUNTER — Ambulatory Visit (HOSPITAL_COMMUNITY)
Admission: RE | Admit: 2013-10-28 | Discharge: 2013-10-28 | Disposition: A | Discharge status: Home / Self Care | Payer: Medicare Other | Source: Ambulatory Visit | Attending: Radiation Oncology | Admitting: Radiation Oncology

## 2013-10-28 DIAGNOSIS — C3410 Malignant neoplasm of upper lobe, unspecified bronchus or lung: Secondary | ICD-10-CM

## 2013-10-28 DIAGNOSIS — C3490 Malignant neoplasm of unspecified part of unspecified bronchus or lung: Secondary | ICD-10-CM | POA: Insufficient documentation

## 2013-11-14 ENCOUNTER — Ambulatory Visit: Payer: Medicare Other | Admitting: Neurology

## 2013-12-09 ENCOUNTER — Ambulatory Visit (INDEPENDENT_AMBULATORY_CARE_PROVIDER_SITE_OTHER): Payer: Medicare Other | Admitting: Podiatry

## 2013-12-09 ENCOUNTER — Encounter: Payer: Self-pay | Admitting: Podiatry

## 2013-12-09 VITALS — BP 161/65 | HR 68

## 2013-12-09 DIAGNOSIS — M79609 Pain in unspecified limb: Secondary | ICD-10-CM

## 2013-12-09 DIAGNOSIS — M79606 Pain in leg, unspecified: Secondary | ICD-10-CM

## 2013-12-09 DIAGNOSIS — B351 Tinea unguium: Secondary | ICD-10-CM

## 2013-12-09 NOTE — Progress Notes (Signed)
Subjective: 80 year old male presents using a walker requesting toe nails trimmed. Wearing compression socks.  Objective: Less edema on lower limbs.  Thick hypertrophic nails x 10.  Pedal pulses are not palpable.  No open lesions or acute changes.  Assessment: PVD.  Onychomycosis x 10.  Pain from thick toe nails.   Plan: Debrided all nails.

## 2013-12-09 NOTE — Patient Instructions (Signed)
Seen for hypertrophic nails. All nails debrided. Return in 3 months or as needed.  

## 2013-12-30 ENCOUNTER — Other Ambulatory Visit: Payer: Self-pay | Admitting: Neurology

## 2014-01-16 IMAGING — CR DG SHOULDER 2+V*R*
2 series · 2 of 2 positions shown · non-contrast
Comparison: None.

CLINICAL DATA: Fall, shoulder pain.

EXAM:
RIGHT SHOULDER - 2+ VIEW

[x shoulder ap right (1 of 2)]
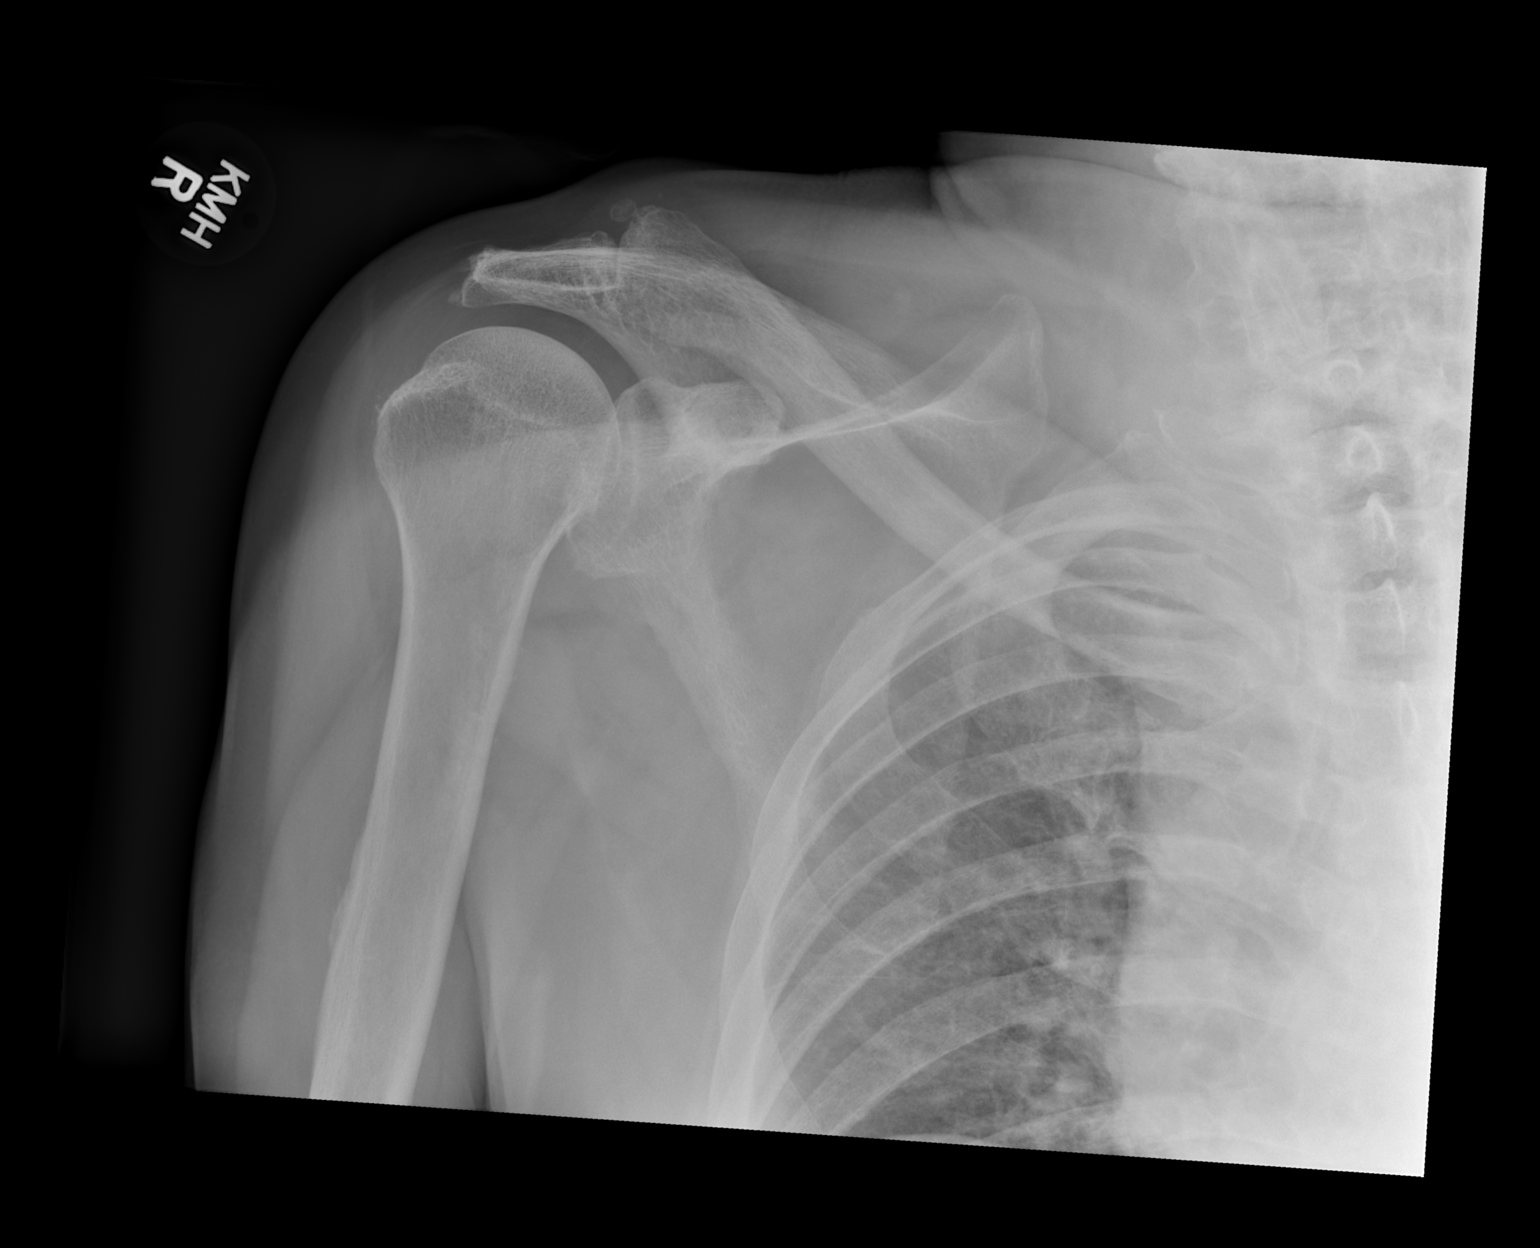

[x shoulder ap right (2 of 2)]
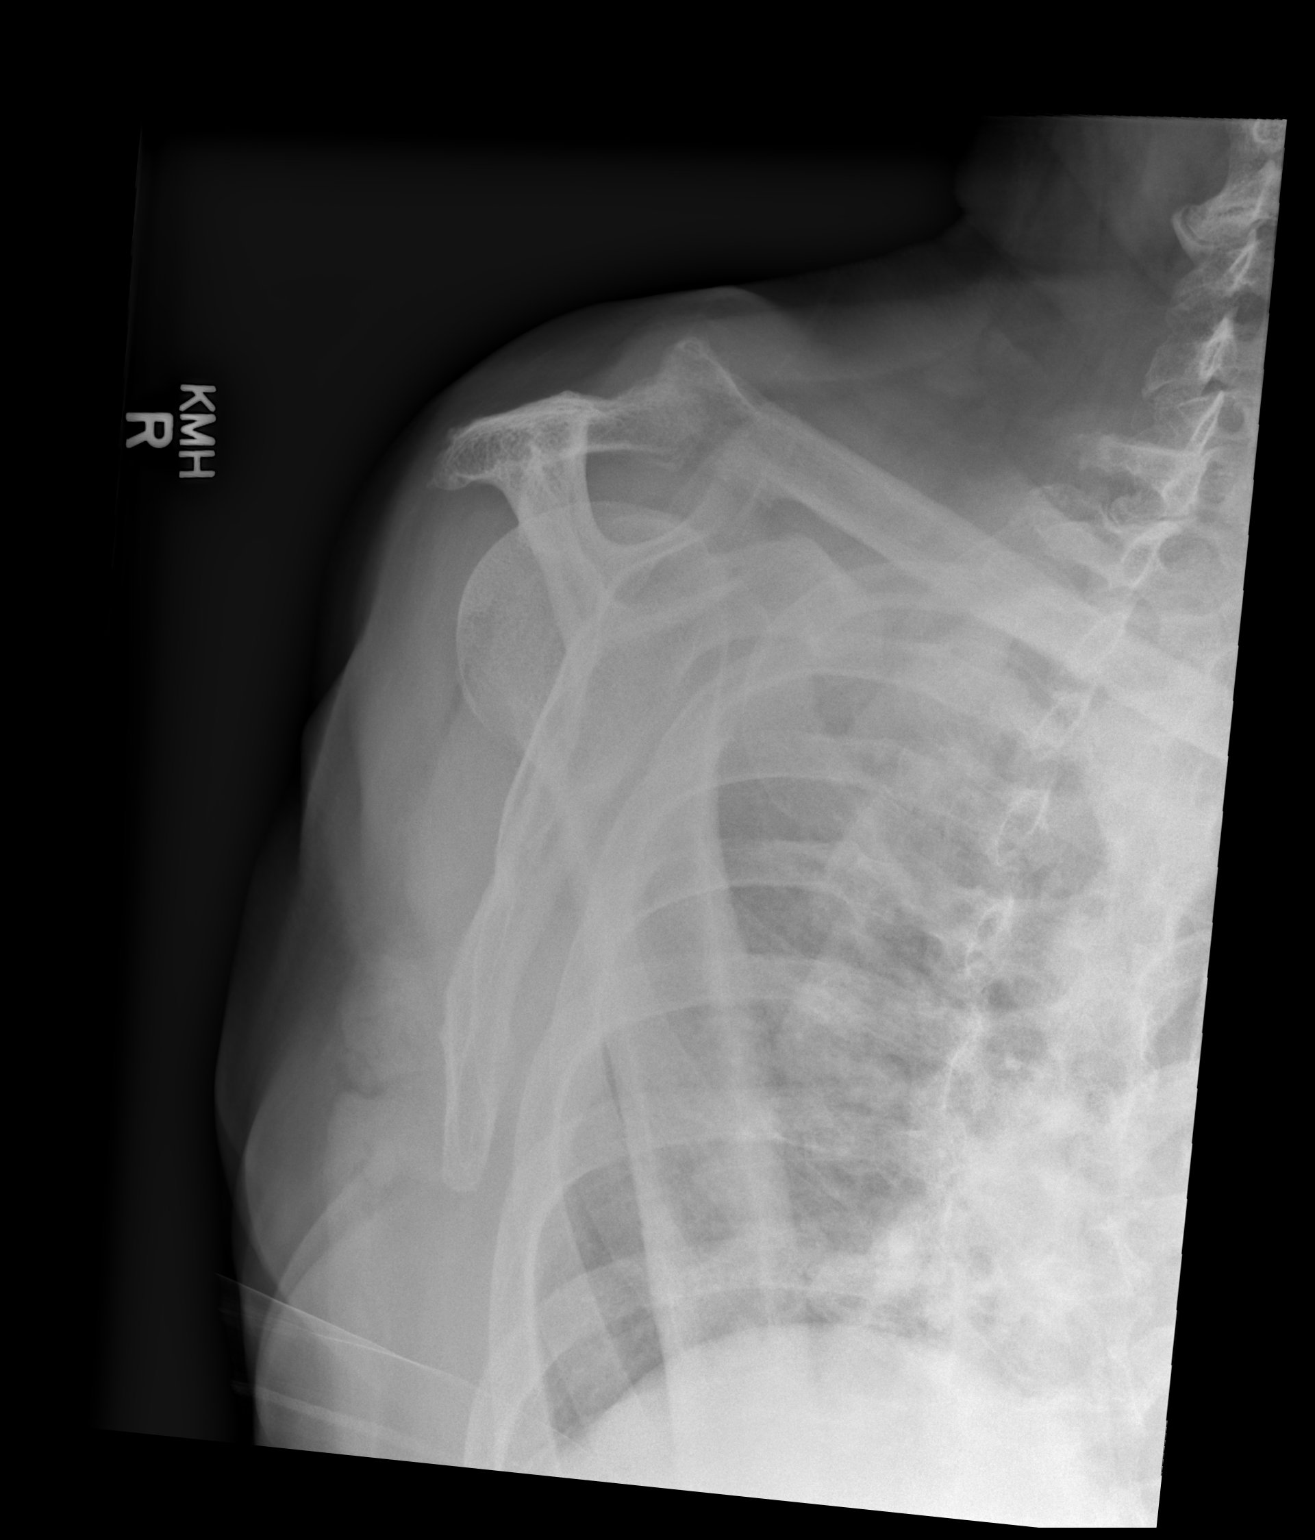

[2 of 2 positions shown; findings below may reference images not displayed]

FINDINGS: Degenerative changes in the right AC joint. Glenohumeral joint is
intact. No fracture, subluxation or dislocation.
IMPRESSION: No acute findings.

## 2014-01-30 ENCOUNTER — Encounter: Payer: Self-pay | Admitting: Neurology

## 2014-01-30 ENCOUNTER — Ambulatory Visit (INDEPENDENT_AMBULATORY_CARE_PROVIDER_SITE_OTHER): Payer: Medicare Other | Admitting: Neurology

## 2014-01-30 VITALS — BP 150/70 | HR 78 | Ht 67.0 in | Wt 180.0 lb

## 2014-01-30 DIAGNOSIS — G7000 Myasthenia gravis without (acute) exacerbation: Secondary | ICD-10-CM

## 2014-01-30 MED ORDER — PYRIDOSTIGMINE BROMIDE 60 MG PO TABS: 60.0000 mg | ORAL_TABLET | Freq: Three times a day (TID) | ORAL | Status: DC

## 2014-01-30 NOTE — Patient Instructions (Signed)
Myasthenia Gravis Myasthenia gravis is a disease that causes muscle weakness throughout the body. The muscles affected are the ones we can control (voluntary muscles). An example of a voluntary muscle is your hand muscles. You can control the muscles to make the hand pick something up. An example of an involuntary muscle is the heart. The heart beats without any direction from you.  Myasthenia Gravis is thought to be an autoimmune disease. That means that normal defenses of the body begin to attack the body. In this case, the immune system begins to attack cells located at the junctions of the muscles and the nerves. Women are affected more often. Women are affected at a younger age than men. Babies born to affected women frequently develop symptoms at an early age. SYMPTOMS Initially in the disease, the facial muscles are affected first. After this, a person may develop droopy eyelids. They may have difficulty controlling facial muscles. They may have problems chewing. Swallowing and speaking may become impaired. The weakness gradually spreads to the arms and legs. It begins to affect breathing. Sometimes, the symptoms lessen or go away without any apparent cause. DIAGNOSIS  Diagnosis can be made with blood tests. Tests such as electromyography may be done to examine the electrical activity in the muscle. An improvement in symptoms after having an anti-cholinesterase drug helps confirm the diagnosis.  TREATMENT  Medicines are usually prescribed as the first treatment. These medicines help, but they do not cure the disease. A plasma cleansing procedure (plasmapheresis) can be used to treat a crisis. It can also be used to prepare a person for surgery. This procedure produces short-term improvement. Some cases are helped by removing the thymus gland. Steroids are used for short-term benefits. Document Released: 11/10/2000 Document Revised: 10/27/2011 Document Reviewed: 08/04/2005 [facility] Patient  Information 2014 [place].

## 2014-01-30 NOTE — Progress Notes (Signed)
Reason for visit: Myasthenia gravis  Marvin [name] is an 80 y.o. male  History of present illness:  Marvin [name] is a 80 year old right-handed white male with a history of myasthenia gravis with primarily ocular features. The patient indicates that he has had some new issues over the last 2 weeks. Indicates that he has had episodes of slight shortness of breath, and that these episodes are associated with certain positions. He will sit in a recliner, and elevate his legs, and during this period time, he may experience some slight dyspnea. He is able to improve the dyspnea by sitting upright, getting his feet on the floor. He indicates that he does not have shortness of breath while laying down flat in bed. He denies dyspnea on exertion. He also has noted some mild issues with dysphagia that may occur not while eating, but he may feel as if something is in his throat at other times. He will clear his throat, and he feels better. He denies any problems while eating at meals with food or fluids. He has had some problems with ptosis, and occasionally the ptosis may be quite prominent on the left side. Indicates that he has lost most of vision of the left eye, and he does not have double vision. He believes that the vision in the right eye has become more blurred recently. He has a significant gait instability problem, and he uses a walker for ambulation. He has not had any falls within the last 6 months. He returns to this office for an evaluation. The patient remains on prednisone taking 5 mg 3 times daily, and he takes Mestinon 30 mg 3 times daily.  Past Medical History  Diagnosis Date  . Hyperlipidemia   . Hypertension   . Dizziness   . Blood clot in vein   . Arthritis   . Peripheral arterial disease   . Carotid artery occlusion   . Ocular myasthenia gravis   . BPH (benign prostatic hyperplasia)   . Nephrolithiasis   . Low back pain   . Gait disorder   . Hx of radiation therapy  07/06/12, 07/08/12, 07/13/12    left upper lung nodule, 3 fractions  . Lung cancer 05/2012    left  . Hearing difficulty   . Lumbago   . Abnormality of gait 05/09/2013  . Hearing deficit     right hearing aid  . Peripheral edema     Past Surgical History  Procedure Laterality Date  . Appendectomy    . Cataract extraction      bilateral  . Retinal detachment surgery    . Thyroidectomy, partial      Family History  Problem Relation Age of Onset  . Cancer Mother   . Pneumonia Father   . Stroke Sister   . Hypertension Sister   . Heart disease Sister     Social history:  reports that he has quit smoking. His smoking use included Cigarettes. He has a 60 pack-year smoking history. He has never used smokeless tobacco. He reports that he does not drink alcohol or use illicit drugs.    Allergies  Allergen Reactions  . Hydrocodone     unknown    Medications:  Current Outpatient Prescriptions on File Prior to Visit  Medication Sig Dispense Refill  . acetaminophen (TYLENOL) 325 MG tablet Take 2 tablets (650 mg total) by mouth every 6 (six) hours as needed for mild pain (or Fever >/= 101).  30 tablet  0  .  amLODipine (NORVASC) 5 MG tablet Take 1 tablet (5 mg total) by mouth daily.  30 tablet  0  . aspirin 81 MG chewable tablet Chew 81 mg by mouth every morning.       . furosemide (LASIX) 20 MG tablet Take 20-40 mg by mouth every morning. Takes and additional  40 mg in the evening as needed      . hydrochlorothiazide (HYDRODIURIL) 25 MG tablet Take 12.5 mg by mouth daily. 1/2 tablet      . ketoconazole (NIZORAL) 2 % cream Apply 1 application topically daily.      . Multiple Vitamin (MULTIVITAMIN WITH MINERALS) TABS Take 1 tablet by mouth every morning.       . ondansetron (ZOFRAN) 4 MG tablet Take 1 tablet (4 mg total) by mouth every 6 (six) hours as needed for nausea.  20 tablet  0  . predniSONE (DELTASONE) 5 MG tablet TAKE 1 TABLET BY MOUTH THREE TIMES DAILY  270 tablet  1  .  simvastatin (ZOCOR) 40 MG tablet Take 40 mg by mouth at bedtime.        . Tamsulosin HCl (FLOMAX) 0.4 MG CAPS Take 0.4 mg by mouth at bedtime.      . Vitamin D, Ergocalciferol, (DRISDOL) 50000 UNITS CAPS Take 50,000 Units by mouth every 7 (seven) days. On Sundays       No current facility-administered medications on file prior to visit.    ROS:  Out of a complete 14 system review of symptoms, the patient complains only of the following symptoms, and all other reviewed systems are negative.  Shortness of breath Dysphagia  Blood pressure 150/70, pulse 78, height 5\' 7"  (1.702 m), weight 180 lb (81.647 kg).  Physical Exam  General: The patient is alert and cooperative at the time of the examination.  Respiratory: Lung fields are clear.  Skin: 2-3+ edema in the ankles is noted bilaterally.   Neurologic Exam  Mental status: The patient is oriented x 3.  Cranial nerves: Facial symmetry is present. Speech is normal, no aphasia or dysarthria is noted. Extraocular movements are full. Visual fields are full. With superior gaze for 1 minute, the patient has no increase in ptosis of the eyes.  Motor: The patient has good strength in all 4 extremities. With the arms outstretched 1 minute, there is fatigable weakness of the deltoids bilaterally.  Sensory examination: Soft touch sensation is symmetric on the face, arms, and legs.  Coordination: The patient has good finger-nose-finger and heel-to-shin bilaterally.  Gait and station: The patient has a wide-based, unsteady gait. The patient uses a walker for ambulation. Tandem gait was not attempted. Romberg is positive, the patient does backwards. No drift is seen.  Reflexes: Deep tendon reflexes are symmetric, but are depressed throughout.   Assessment/Plan:  1. Myasthenia gravis with ocular features  2. Gait disturbance  3. Reported intermittent mild dysphagia and dyspnea  The patient apparently has already had a chest x-ray, EKG  that were unremarkable. He has been referred to cardiology, which is reasonable. The patient has had ocular myasthenia for number of years, it would be unusual for this disease to begin causing problems with breathing and swallowing at this time. The patient will go up on the Mestinon taking 60 mg 3 times daily. He will contact me if he has problems tolerating this dose increase. He already has an appointment in August of 2015, he will keep this appointment.  Jill Alexanders MD 01/30/2014 7:55 PM  [facility] [address] [place], [place] [address]  Phone [phone] Fax [phone]

## 2014-02-07 ENCOUNTER — Encounter: Payer: Self-pay | Admitting: Cardiovascular Disease

## 2014-02-07 ENCOUNTER — Ambulatory Visit (INDEPENDENT_AMBULATORY_CARE_PROVIDER_SITE_OTHER): Payer: Medicare Other | Admitting: Cardiovascular Disease

## 2014-02-07 VITALS — BP 142/70 | HR 76 | Ht 67.0 in | Wt 181.3 lb

## 2014-02-07 DIAGNOSIS — G7000 Myasthenia gravis without (acute) exacerbation: Secondary | ICD-10-CM

## 2014-02-07 DIAGNOSIS — I10 Essential (primary) hypertension: Secondary | ICD-10-CM

## 2014-02-07 DIAGNOSIS — R0602 Shortness of breath: Secondary | ICD-10-CM

## 2014-02-07 DIAGNOSIS — R5381 Other malaise: Secondary | ICD-10-CM

## 2014-02-07 DIAGNOSIS — IMO0001 Reserved for inherently not codable concepts without codable children: Secondary | ICD-10-CM

## 2014-02-07 DIAGNOSIS — E785 Hyperlipidemia, unspecified: Secondary | ICD-10-CM

## 2014-02-07 DIAGNOSIS — I739 Peripheral vascular disease, unspecified: Secondary | ICD-10-CM

## 2014-02-07 DIAGNOSIS — E782 Mixed hyperlipidemia: Secondary | ICD-10-CM

## 2014-02-07 DIAGNOSIS — R5383 Other fatigue: Secondary | ICD-10-CM

## 2014-02-07 DIAGNOSIS — I779 Disorder of arteries and arterioles, unspecified: Secondary | ICD-10-CM

## 2014-02-07 NOTE — Patient Instructions (Signed)
Your physician has recommended you make the following change in your medication: increase the furosemide to 40 mg daily in the morning. Decrease the simvastatin to 20 mg  (1/2 of the 40 mg tablet)  Your physician has requested that you have a lexiscan myoview. For further information please visit [email or web address]. Please follow instruction sheet, as given.  Your physician has requested that you have an echocardiogram. Echocardiography is a painless test that uses sound waves to create images of your heart. It provides your doctor with information about the size and shape of your heart and how well your heart's chambers and valves are working. This procedure takes approximately one hour. There are no restrictions for this procedure.   Your physician has requested that you have a carotid duplex. This test is an ultrasound of the carotid arteries in your neck. It looks at blood flow through these arteries that supply the brain with blood. Allow one hour for this exam. There are no restrictions or special instructions.   Your physician recommends that you return for lab work fasting.  Your physician recommends that you schedule a follow-up appointment in: 4-6 weeks.

## 2014-02-10 ENCOUNTER — Encounter: Payer: Self-pay | Admitting: Cardiovascular Disease

## 2014-02-10 DIAGNOSIS — I779 Disorder of arteries and arterioles, unspecified: Secondary | ICD-10-CM | POA: Insufficient documentation

## 2014-02-10 DIAGNOSIS — I739 Peripheral vascular disease, unspecified: Secondary | ICD-10-CM

## 2014-02-10 DIAGNOSIS — R0602 Shortness of breath: Secondary | ICD-10-CM | POA: Insufficient documentation

## 2014-02-10 NOTE — Progress Notes (Addendum)
Patient ID: Marvin [name], male   DOB: [birth year], 80 y.o.   MRN: [id number]     PATIENT PROFILE: Marvin [name] is a 80 y.o. male presents for the courtesy of Dr. Alyson Ingles for evaluation of fatigue and shortness of breath.   HPI:  [name] is a 80 y.o. male who is a World War II veteran and had seen Dr. Wyatt Portela, over 2 years ago.  He has peripheral vascular disease and was initially referred for carotid ultrasound in 2012 following the identification of Hollenhorst plaques on her retinal exam.  The patient has had multiple eye surgeries due to retinal detachment.  Carotid ultrasound in 2012 showed severe 80-99% right internal carotid stenosis, as well as 50-69% left internal carotid stenosis.  There was less than 50% subclavian diameter reduction.  He had normal bilateral vertebral antegrade flow.  He was referred to Dr.Fields and did not pursue carotid surgery.  Recently, he apparently was hospitalized in December.  He is now residing at [facility] in independent living.  He has noticed increasing weakness, as well as shortness of breath and leg swelling.  He now presents for cardiology evaluation. He denies chest tightness.  Past Medical History  Diagnosis Date  . Hyperlipidemia   . Hypertension   . Dizziness   . Blood clot in vein   . Arthritis   . Peripheral arterial disease   . Carotid artery occlusion   . Ocular myasthenia gravis   . BPH (benign prostatic hyperplasia)   . Nephrolithiasis   . Low back pain   . Gait disorder   . Hx of radiation therapy 07/06/12, 07/08/12, 07/13/12    left upper lung nodule, 3 fractions  . Lung cancer 05/2012    left  . Hearing difficulty   . Lumbago   . Abnormality of gait 05/09/2013  . Hearing deficit     right hearing aid  . Peripheral edema     Past Surgical History  Procedure Laterality Date  . Appendectomy    . Cataract extraction      bilateral  . Retinal detachment surgery    . Thyroidectomy, partial       Allergies  Allergen Reactions  . Hydrocodone     unknown    Current Outpatient Prescriptions  Medication Sig Dispense Refill  . acetaminophen (TYLENOL) 325 MG tablet Take 2 tablets (650 mg total) by mouth every 6 (six) hours as needed for mild pain (or Fever >/= 101).  30 tablet  0  . amLODipine (NORVASC) 5 MG tablet Take 1 tablet (5 mg total) by mouth daily.  30 tablet  0  . aspirin 81 MG chewable tablet Chew 81 mg by mouth every morning.       . furosemide (LASIX) 20 MG tablet Take 20-40 mg by mouth every morning. Takes and additional  40 mg in the evening as needed      . hydrochlorothiazide (HYDRODIURIL) 25 MG tablet Take 12.5 mg by mouth daily. 1/2 tablet      . ketoconazole (NIZORAL) 2 % cream Apply 1 application topically daily.      . Multiple Vitamin (MULTIVITAMIN WITH MINERALS) TABS Take 1 tablet by mouth every morning.       . predniSONE (DELTASONE) 5 MG tablet TAKE 1 TABLET BY MOUTH THREE TIMES DAILY  270 tablet  1  . pyridostigmine (MESTINON) 60 MG tablet Take 1 tablet (60 mg total) by mouth 3 (three) times daily.  90 tablet  5  . simvastatin (  ZOCOR) 40 MG tablet Take 20 mg by mouth at bedtime.       . Tamsulosin HCl (FLOMAX) 0.4 MG CAPS Take 0.4 mg by mouth at bedtime.      . Vitamin D, Ergocalciferol, (DRISDOL) 50000 UNITS CAPS Take 50,000 Units by mouth every 7 (seven) days. On Sundays       No current facility-administered medications for this visit.    Socially, he is widowed.  He was born in [place]. He lives alone, but in independent.  He previously had worked for [employer].  He has 3 children and 2 grandchildren.  He had smoked for 40 years, but quit smoking in 1976.  There is no alcohol use.  Family History  Problem Relation Age of Onset  . Cancer Mother   . Pneumonia Father   . Stroke Sister   . Hypertension Sister   . Heart disease Sister     ROS General: Negative; No fevers, chills, or night sweats HEENT: Positive for decreased  vision in his left eye secondary to retinal detachments.  He uses a hearing aid; no sinus congestion, difficulty swallowing Pulmonary: Negative; No cough, wheezing, shortness of breath, hemoptysis Cardiovascular:  See HPI; No chest pain, presyncope, syncope, palpitations,  Positive for leg swelling GI: Negative; No nausea, vomiting, diarrhea, or abdominal pain GU: Negative; No dysuria, hematuria, or difficulty voiding Musculoskeletal: Negative; no myalgias, joint pain, or weakness Hematologic/Oncologic: Negative; no easy bruising, bleeding Endocrine: Negative; no heat/cold intolerance; no diabetes Neuro: Positive for myasthenia gravis; no changes in balance, headaches Skin: Negative; No rashes or skin lesions Psychiatric: Negative; No behavioral problems, depression Sleep: Negative; No daytime sleepiness, hypersomnolence, bruxism, restless legs, hypnogagnic hallucinations Other comprehensive 14 point system review is negative   Physical Exam BP 142/70  Pulse 76  Ht 5\' 7"  (1.702 m)  Wt 181 lb 4.8 oz (82.237 kg)  BMI 28.39 kg/m2 Repeat blood pressure by me 180/80 General: Alert, oriented, no distress.  Skin: normal turgor, no rashes, warm and dry HEENT: Normocephalic, atraumatic. Pupils equal round and reactive to light; sclera anicteric; extraocular muscles intact; Fundi not well visualized Nose without nasal septal hypertrophy Mouth/Parynx benign; Mallinpatti scale 3 Neck: Bilateral carotid bruits, right more high-pitched, left more harsh Lungs: clear to ausculatation and percussion; no wheezing or rales Chest wall: without tenderness to palpitation Heart: PMI not displaced, RRR, s1 s2 normal, 2/6 systolic murmur, no diastolic murmur, no rubs, gallops, thrills, or heaves Abdomen: soft, nontender; no hepatosplenomehaly, BS+; abdominal aorta nontender and not dilated by palpation. Back: no CVA tenderness Pulses 2+ Musculoskeletal: full range of motion, normal strength, no joint  deformities Extremities: 2+ pitting edema on the right and 1+ on the left lower extremity; no clubbing cyanosis, Homan's sign negative  Neurologic: grossly nonfocal; Cranial nerves grossly wnl Psychologic: Normal mood and affect   ECG (independently read by me): Normal sinus rhythm at 76 beats per minute.  PR interval 126 ms.  QTc interval 434 ms. Borderline LVH by voltage in aVL  LABS:  BMET    Component Value Date/Time   NA 138 08/03/2013 0350   K 4.1 08/03/2013 0350   CL 105 08/03/2013 0350   CO2 23 08/03/2013 0350   GLUCOSE 127* 08/03/2013 0350   BUN 26* 08/03/2013 0350   CREATININE 1.28 08/03/2013 0350   CALCIUM 8.7 08/03/2013 0350   GFRNONAA 45* 08/03/2013 0350   GFRAA 52* 08/03/2013 0350     Hepatic Function Panel     Component Value Date/Time  PROT 6.2 07/30/2013 1729   ALBUMIN 2.9* 07/30/2013 1729   AST 71* 07/30/2013 1729   ALT 77* 07/30/2013 1729   ALKPHOS 81 07/30/2013 1729   BILITOT 1.1 07/30/2013 1729     CBC    Component Value Date/Time   WBC 10.0 07/31/2013 0437   RBC 2.62* 07/31/2013 0437   HGB 8.0* 07/31/2013 0437   HCT 24.3* 07/31/2013 0437   PLT 209 07/31/2013 0437   MCV 92.7 07/31/2013 0437   MCH 30.5 07/31/2013 0437   MCHC 32.9 07/31/2013 0437   RDW 15.4 07/31/2013 0437   LYMPHSABS 0.9 07/30/2013 1729   MONOABS 0.6 07/30/2013 1729   EOSABS 0.1 07/30/2013 1729   BASOSABS 0.0 07/30/2013 1729     BNP    Component Value Date/Time   PROBNP 1356.0* 07/30/2013 1729    Lipid Panel  No results found for this basename: chol, trig, hdl, cholhdl, vldl, ldlcalc      RADIOLOGY: No results found.   ASSESSMENT AND PLAN: Mr. Othniel Maret is a 80 year old gentleman, who remains very sharp mentally with excellent recall.  He has previously documented severe carotid atherosclerosis.  Recently, he has noticed increasing episodes of shortness of breath and also has significant development of lower extremity edema.  He does have somewhat  labile blood pressure and has been on amlodipine 5 mg in addition to Lasix 20 mg.  He has been on simvastatin 40 mg.  He does have myasthenia gravis and has been on prednisone in addition to Mestinon.  Presently, I am recommending a complete set of laboratory be obtained consisting of a CBC, comprehensive metabolic panel, erythrocyte sedimentation rate, CPK, vitamin D level, and lipid panel.  I am scheduling him for a 2-D echo Doppler study to further evaluate his systolic and diastolic function as well as cardiac murmur.  I am also scheduling him for a followup carotid studies which have not been done for several years.  In the event potentially he may be a candidate for stenting rather than surgery.  With his exertional dyspnea.  I am also scheduling him for LexiScan Myoview study to rule out potential significant ischemia.  Have recommended that he increase his furosemide to 40 mg daily.  Since he is on concomitant amlodipine, I have recommended that he decrease simvastatin to 20 mg.  I will see him back in the office in 4-6 weeks in followup of the above studies and further recommendations will be made at that time.   Troy Sine, MD, [facility] 02/10/2014 11:00 AM

## 2014-02-13 LAB — CBC
HCT: 29.7 % — ABNORMAL LOW (ref 39.0–52.0)
Hemoglobin: 10.3 g/dL — ABNORMAL LOW (ref 13.0–17.0)
MCH: 30.7 pg (ref 26.0–34.0)
MCHC: 34.7 g/dL (ref 30.0–36.0)
MCV: 88.4 fL (ref 78.0–100.0)
Platelets: 247 10*3/uL (ref 150–400)
RBC: 3.36 MIL/uL — AB (ref 4.22–5.81)
RDW: 16.4 % — ABNORMAL HIGH (ref 11.5–15.5)
WBC: 8.5 10*3/uL (ref 4.0–10.5)

## 2014-02-14 ENCOUNTER — Telehealth (HOSPITAL_COMMUNITY): Payer: Self-pay

## 2014-02-14 LAB — COMPREHENSIVE METABOLIC PANEL
ALT: 32 U/L (ref 0–53)
AST: 32 U/L (ref 0–37)
Albumin: 3.6 g/dL (ref 3.5–5.2)
Alkaline Phosphatase: 52 U/L (ref 39–117)
BUN: 32 mg/dL — ABNORMAL HIGH (ref 6–23)
CALCIUM: 8.9 mg/dL (ref 8.4–10.5)
CHLORIDE: 98 meq/L (ref 96–112)
CO2: 29 meq/L (ref 19–32)
CREATININE: 1.77 mg/dL — AB (ref 0.50–1.35)
GLUCOSE: 120 mg/dL — AB (ref 70–99)
Potassium: 3.7 mEq/L (ref 3.5–5.3)
Sodium: 141 mEq/L (ref 135–145)
Total Bilirubin: 1.1 mg/dL (ref 0.2–1.2)
Total Protein: 5.7 g/dL — ABNORMAL LOW (ref 6.0–8.3)

## 2014-02-14 LAB — LIPID PANEL
Cholesterol: 177 mg/dL (ref 0–200)
HDL: 56 mg/dL (ref >39)
LDL CALC: 77 mg/dL (ref 0–99)
TRIGLYCERIDES: 220 mg/dL — AB (ref <150)
Total CHOL/HDL Ratio: 3.2 Ratio
VLDL: 44 mg/dL — ABNORMAL HIGH (ref 0–40)

## 2014-02-14 LAB — VITAMIN B12: VITAMIN B 12: 649 pg/mL (ref 211–911)

## 2014-02-14 LAB — SEDIMENTATION RATE: Sed Rate: 18 mm/hr — ABNORMAL HIGH (ref 0–16)

## 2014-02-14 LAB — TSH: TSH: 1.232 u[IU]/mL (ref 0.350–4.500)

## 2014-02-14 LAB — CK: Total CK: 132 U/L (ref 7–232)

## 2014-02-16 ENCOUNTER — Ambulatory Visit (HOSPITAL_COMMUNITY)
Admission: RE | Admit: 2014-02-16 | Discharge: 2014-02-16 | Disposition: A | Discharge status: Home / Self Care | Payer: Medicare Other | Source: Ambulatory Visit | Attending: Internal Medicine | Admitting: Internal Medicine

## 2014-02-16 DIAGNOSIS — I739 Peripheral vascular disease, unspecified: Secondary | ICD-10-CM | POA: Insufficient documentation

## 2014-02-16 DIAGNOSIS — R5383 Other fatigue: Secondary | ICD-10-CM

## 2014-02-16 DIAGNOSIS — I779 Disorder of arteries and arterioles, unspecified: Secondary | ICD-10-CM | POA: Insufficient documentation

## 2014-02-16 DIAGNOSIS — I10 Essential (primary) hypertension: Secondary | ICD-10-CM | POA: Insufficient documentation

## 2014-02-16 DIAGNOSIS — Z87891 Personal history of nicotine dependence: Secondary | ICD-10-CM | POA: Insufficient documentation

## 2014-02-16 DIAGNOSIS — R5381 Other malaise: Secondary | ICD-10-CM | POA: Insufficient documentation

## 2014-02-16 DIAGNOSIS — Z8249 Family history of ischemic heart disease and other diseases of the circulatory system: Secondary | ICD-10-CM | POA: Insufficient documentation

## 2014-02-16 DIAGNOSIS — R0602 Shortness of breath: Secondary | ICD-10-CM

## 2014-02-16 DIAGNOSIS — Z8673 Personal history of transient ischemic attack (TIA), and cerebral infarction without residual deficits: Secondary | ICD-10-CM | POA: Insufficient documentation

## 2014-02-16 DIAGNOSIS — R42 Dizziness and giddiness: Secondary | ICD-10-CM | POA: Insufficient documentation

## 2014-02-16 MED ORDER — REGADENOSON 0.4 MG/5ML IV SOLN
0.4000 mg | Freq: Once | INTRAVENOUS | Status: AC
  Administered 2014-02-16: 0.4 mg via INTRAVENOUS

## 2014-02-16 MED ORDER — TECHNETIUM TC 99M SESTAMIBI GENERIC - CARDIOLITE
30.5000 | Freq: Once | INTRAVENOUS | Status: AC | PRN
  Administered 2014-02-16: 31 via INTRAVENOUS

## 2014-02-16 MED ORDER — TECHNETIUM TC 99M SESTAMIBI GENERIC - CARDIOLITE
10.3000 | Freq: Once | INTRAVENOUS | Status: AC | PRN
  Administered 2014-02-16: 10 via INTRAVENOUS

## 2014-02-16 NOTE — Procedures (Addendum)
[place] [address] [place] [address] [phone]  Cardiology Nuclear Med Study  Marvin [name] is a 80 y.o. male     MRN : [id number]     DOB: 10-Jan-1914  Procedure Date: 02/16/2014  Nuclear Med Background Indication for Stress Test:  Evaluation for Ischemia History:  No prior cardiac or respiratory history reported;No prior NUC MPI for comparison. Cardiac Risk Factors: Carotid Disease, Family History - CAD, History of Smoking, Hypertension, Lipids, Overweight, PVD, TIA and DVT  Symptoms:  Dizziness, Fatigue, Light-Headedness and SOB   Nuclear Pre-Procedure Caffeine/Decaff Intake:  9:00pm NPO After: 7:00am   IV Site: R Forearm  IV 0.9% NS with Angio Cath:  22g  Chest Size (in):  46"  IV Started by: Rolene Course, RN  Height: 5\' 7"  (1.702 m)  Cup Size: n/a  BMI:  Body mass index is 28.34 kg/(m^2). Weight:  181 lb (82.101 kg)   Tech Comments:  n/a    Nuclear Med Study 1 or 2 day study: 1 day  Stress Test Type:  [place] Provider:  Shelva Majestic, MD   Resting Radionuclide: Technetium 65m Sestamibi  Resting Radionuclide Dose: 10.3 mCi   Stress Radionuclide:  Technetium 42m Sestamibi  Stress Radionuclide Dose: 30.5 mCi           Stress Protocol Rest HR:65 Stress HR: 74  Rest BP: 151/70 Stress BP: 154/66  Exercise Time (min): n/a METS: n/a   Predicted Max HR: 122 bpm % Max HR: 66.39 bpm Rate Pressure Product: 13446  Dose of Adenosine (mg):  n/a Dose of Lexiscan: n/a mg  Dose of Atropine (mg): n/a Dose of Dobutamine: n/a mcg/kg/min (at max HR)  Stress Test Technologist: Leane Para, CCT Nuclear Technologist: Imagene Riches, CNMT   Rest Procedure:  Myocardial perfusion imaging was performed at rest 45 minutes following the intravenous administration of Technetium 70m Sestamibi. Stress Procedure:  The patient received IV Lexiscan 0.4 mg over 15-seconds.  Technetium 52m Sestamibi  injected iv at 30-seconds.  There were no significant changes with Lexiscan.  Quantitative spect images were obtained after a 45 minute delay.  Transient Ischemic Dilatation (Normal <1.22):  1.06  QGS EDV:  59 ml QGS ESV:  26 ml LV Ejection Fraction: 55%  Rest ECG: NSR - Normal EKG  Stress ECG: There are scattered PVCs. and There are scattered PACs.  QPS Raw Data Images:  Normal; no motion artifact; normal heart/lung ratio. Stress Images:  Normal homogeneous uptake in all areas of the myocardium. Rest Images:  Normal homogeneous uptake in all areas of the myocardium. Subtraction (SDS):  No evidence of ischemia.  Impression Exercise Capacity:  Lexiscan with no exercise. BP Response:  Normal blood pressure response. Clinical Symptoms:  No significant symptoms noted. ECG Impression:  No significant ECG changes with Lexiscan. Comparison with Prior Nuclear Study: No previous nuclear study performed  Overall Impression:  Normal stress nuclear study.  LV Wall Motion:  NL LV Function; NL Wall Motion; EF 55%  [name], MD, [facility] Board Certified in Nuclear Cardiology Attending Cardiologist [place], MD  02/16/2014 12:46 PM

## 2014-02-23 ENCOUNTER — Ambulatory Visit (HOSPITAL_COMMUNITY)
Admission: RE | Admit: 2014-02-23 | Discharge: 2014-02-23 | Disposition: A | Discharge status: Home / Self Care | Payer: Medicare Other | Source: Ambulatory Visit | Attending: Internal Medicine | Admitting: Internal Medicine

## 2014-02-23 DIAGNOSIS — R5381 Other malaise: Secondary | ICD-10-CM | POA: Insufficient documentation

## 2014-02-23 DIAGNOSIS — R5383 Other fatigue: Secondary | ICD-10-CM

## 2014-02-23 DIAGNOSIS — I779 Disorder of arteries and arterioles, unspecified: Secondary | ICD-10-CM

## 2014-02-23 DIAGNOSIS — I059 Rheumatic mitral valve disease, unspecified: Secondary | ICD-10-CM

## 2014-02-23 DIAGNOSIS — I739 Peripheral vascular disease, unspecified: Secondary | ICD-10-CM

## 2014-02-23 DIAGNOSIS — I6523 Occlusion and stenosis of bilateral carotid arteries: Secondary | ICD-10-CM

## 2014-02-23 DIAGNOSIS — I6529 Occlusion and stenosis of unspecified carotid artery: Secondary | ICD-10-CM

## 2014-02-23 DIAGNOSIS — I10 Essential (primary) hypertension: Secondary | ICD-10-CM | POA: Insufficient documentation

## 2014-02-23 NOTE — Progress Notes (Signed)
Carotid Duplex Completed. °Brianna L Mazza,RVT °

## 2014-02-23 NOTE — Progress Notes (Signed)
2D Echo Performed 02/23/2014    Marygrace Drought, RCS

## 2014-03-02 NOTE — Telephone Encounter (Signed)
Encounter complete. 

## 2014-03-22 ENCOUNTER — Other Ambulatory Visit: Payer: Self-pay | Admitting: *Deleted

## 2014-03-22 MED ORDER — CLOPIDOGREL BISULFATE 75 MG PO TABS: 75.0000 mg | ORAL_TABLET | Freq: Every day | ORAL | Status: DC

## 2014-03-24 ENCOUNTER — Encounter: Payer: Self-pay | Admitting: Podiatry

## 2014-03-24 ENCOUNTER — Ambulatory Visit (INDEPENDENT_AMBULATORY_CARE_PROVIDER_SITE_OTHER): Payer: Medicare Other | Admitting: Podiatry

## 2014-03-24 DIAGNOSIS — M79606 Pain in leg, unspecified: Secondary | ICD-10-CM

## 2014-03-24 DIAGNOSIS — M79609 Pain in unspecified limb: Secondary | ICD-10-CM

## 2014-03-24 DIAGNOSIS — B351 Tinea unguium: Secondary | ICD-10-CM

## 2014-03-24 NOTE — Progress Notes (Signed)
Subjective:  80 year old male presents accompanied by his son using a walker requesting toe nails trimmed.  Wearing compression socks.   Objective: Positive of edema on lower limbs.  Thick hypertrophic nails x 10.  Pedal pulses are not palpable.  No open lesions or acute changes.  Assessment: PVD.  Onychomycosis x 10.  Pain from thick toe nails.   Plan: Debrided all nails.

## 2014-03-24 NOTE — Patient Instructions (Signed)
Seen for hypertrophic nails. All nails debrided. Return in 3 months or as needed.  

## 2014-03-27 ENCOUNTER — Encounter: Payer: Self-pay | Admitting: *Deleted

## 2014-03-31 ENCOUNTER — Ambulatory Visit (INDEPENDENT_AMBULATORY_CARE_PROVIDER_SITE_OTHER): Payer: Medicare Other | Admitting: Cardiovascular Disease

## 2014-03-31 ENCOUNTER — Encounter: Payer: Self-pay | Admitting: Cardiovascular Disease

## 2014-03-31 VITALS — BP 178/70 | HR 65 | Ht 67.0 in | Wt 179.3 lb

## 2014-03-31 DIAGNOSIS — G7000 Myasthenia gravis without (acute) exacerbation: Secondary | ICD-10-CM

## 2014-03-31 DIAGNOSIS — L600 Ingrowing nail: Secondary | ICD-10-CM

## 2014-03-31 DIAGNOSIS — N183 Chronic kidney disease, stage 3 unspecified: Secondary | ICD-10-CM

## 2014-03-31 DIAGNOSIS — I6529 Occlusion and stenosis of unspecified carotid artery: Secondary | ICD-10-CM

## 2014-03-31 DIAGNOSIS — I739 Peripheral vascular disease, unspecified: Secondary | ICD-10-CM

## 2014-03-31 DIAGNOSIS — I779 Disorder of arteries and arterioles, unspecified: Secondary | ICD-10-CM

## 2014-03-31 MED ORDER — METOPROLOL TARTRATE 25 MG PO TABS: 25.0000 mg | ORAL_TABLET | Freq: Two times a day (BID) | ORAL | Status: DC

## 2014-03-31 MED ORDER — METOPROLOL TARTRATE 25 MG PO TABS
12.5000 mg | ORAL_TABLET | Freq: Two times a day (BID) | ORAL | Status: DC
Start: 2014-03-31 — End: 2014-04-26

## 2014-03-31 NOTE — Patient Instructions (Addendum)
Your physician has recommended you make the following change in your medication: increase the HCTZ ( fluid pill ) from 1/2 tablet to whole tablet daily. Start new prescription for lopressor. Take as directed on the bottle. This has already been sent to your pharmacy.  Your physician recommends that you schedule a follow-up appointment with Dr. Quay Burow to discuss possible carotid stenting.

## 2014-03-31 NOTE — Progress Notes (Signed)
Patient ID: WILMAR PRABHAKAR, male   DOB: Jan 15, 1914, 80 y.o.   MRN: [id number]        HPI:  ALEJANDRA BARNA is a 80 y.o. male who is a World War II veteran and had seen Dr. Sallyanne Kuster over 2 years ago.  He was referred to me 6 weeks ago by Dr. Alyson Ingles for recent evaluation of shortness of breath and fatigue.  He presents for followup evaluation  Mr. Seckman has peripheral vascular disease and was initially referred for carotid ultrasound in 2012 following the identification of Hollenhorst plaques on her retinal exam.  The patient has had multiple eye surgeries due to retinal detachment.  Carotid ultrasound in 2012 showed severe 80-99% right internal carotid stenosis, as well as 50-69% left internal carotid stenosis.  There was less than 50% subclavian diameter reduction.  He had normal bilateral vertebral antegrade flow.  He was referred to Dr.Fields and did not pursue carotid surgery.  Recently, he apparently was hospitalized in December.  He is now residing at [facility] in independent living.  He has noticed increasing weakness, as well as shortness of breath and leg swelling.   When I saw him, I recommended that he undergo a nuclear perfusion study.  This was done on 02/16/2014 and was normal.  Post-rest ejection fraction was 55%.  He had normal perfusion.  An echo Doppler study revealed an ejection fraction of 60-65% without regional wall motion abnormalities.  There was grade 1 diastolic dysfunction.  Had a mildly thickened mitral valve with mild MR.  He had normal right ventricular systolic pressure.  I recommended that he have a three-year followup carotid duplex study.  This revealed progressive stenosis in the right internal carotid artery, which still is in the 80-99% diameter range.  However, the PICA was now 45 (S) and 222 (D).  Several years ago, he did not pursue carotid endarterectomy, but he does not recall if stenting was ever considered an option.  He does have myasthenia  gravis.  He has been on aspirin and Plavix and has noted easy bruisability.  He presents for evaluation with his family.    Past Medical History  Diagnosis Date  . Hyperlipidemia   . Hypertension   . Dizziness   . Blood clot in vein   . Arthritis   . Peripheral arterial disease   . Carotid artery occlusion   . Ocular myasthenia gravis   . BPH (benign prostatic hyperplasia)   . Nephrolithiasis   . Low back pain   . Gait disorder   . Hx of radiation therapy 07/06/12, 07/08/12, 07/13/12    left upper lung nodule, 3 fractions  . Lung cancer 05/2012    left  . Hearing difficulty   . Lumbago   . Abnormality of gait 05/09/2013  . Hearing deficit     right hearing aid  . Peripheral edema     Past Surgical History  Procedure Laterality Date  . Appendectomy    . Cataract extraction      bilateral  . Retinal detachment surgery  02/2011  . Thyroidectomy, partial    . Transthoracic echocardiogram  01/15/2011    EF=>55%, mild conc LVH; mild MR & mild mitral annular calcification; mild TR, RVSP 30-76mmHg; aortic root sclerosis/calcification  . Carotid doppler  04/2011    80-99% stenosis of right prox to mid ICA    Allergies  Allergen Reactions  . Hydrocodone     unknown    Current Outpatient Prescriptions  Medication Sig  Dispense Refill  . acetaminophen (TYLENOL) 325 MG tablet Take 2 tablets (650 mg total) by mouth every 6 (six) hours as needed for mild pain (or Fever >/= 101).  30 tablet  0  . amLODipine (NORVASC) 5 MG tablet Take 1 tablet (5 mg total) by mouth daily.  30 tablet  0  . aspirin 81 MG chewable tablet Chew 81 mg by mouth every morning.       . clopidogrel (PLAVIX) 75 MG tablet Take 1 tablet (75 mg total) by mouth daily.  30 tablet  11  . furosemide (LASIX) 20 MG tablet Take 20-40 mg by mouth every morning. Takes and additional  40 mg in the evening as needed      . hydrochlorothiazide (HYDRODIURIL) 25 MG tablet Take 25 mg by mouth daily.       Marland Kitchen ketoconazole  (NIZORAL) 2 % cream Apply 1 application topically daily.      . Multiple Vitamin (MULTIVITAMIN WITH MINERALS) TABS Take 1 tablet by mouth every morning.       . predniSONE (DELTASONE) 5 MG tablet TAKE 1 TABLET BY MOUTH THREE TIMES DAILY  270 tablet  1  . pyridostigmine (MESTINON) 60 MG tablet Take 1 tablet (60 mg total) by mouth 3 (three) times daily.  90 tablet  5  . simvastatin (ZOCOR) 40 MG tablet Take 20 mg by mouth at bedtime.       . Tamsulosin HCl (FLOMAX) 0.4 MG CAPS Take 0.4 mg by mouth at bedtime.      . Vitamin D, Ergocalciferol, (DRISDOL) 50000 UNITS CAPS Take 50,000 Units by mouth every 7 (seven) days. On Sundays      . metoprolol tartrate (LOPRESSOR) 25 MG tablet Take 0.5 tablets (12.5 mg total) by mouth 2 (two) times daily.  60 tablet  6   No current facility-administered medications for this visit.    Socially, he is widowed.  He was born in [place]. He lives alone, but in independent.  He previously had worked for [employer].  He has 3 children and 2 grandchildren.  He had smoked for 40 years, but quit smoking in 1976.  There is no alcohol use.  Family History  Problem Relation Age of Onset  . Bone cancer Mother   . Pneumonia Father   . Stroke Sister   . Hypertension Sister   . Heart disease Sister     ROS General: Negative; No fevers, chills, or night sweats HEENT: Positive for decreased vision in his left eye secondary to retinal detachments.  He uses a hearing aid; no sinus congestion, difficulty swallowing Pulmonary: Negative; No cough, wheezing, shortness of breath, hemoptysis Cardiovascular:  See HPI; No chest pain, presyncope, syncope, palpitations,  Positive for leg swelling GI: Negative; No nausea, vomiting, diarrhea, or abdominal pain GU: Negative; No dysuria, hematuria, or difficulty voiding Musculoskeletal: Negative; no myalgias, joint pain, or weakness Hematologic/Oncologic: Positive for easy bruising, no overt bleeding Endocrine:  Negative; no heat/cold intolerance; no diabetes Neuro: Positive for myasthenia gravis; no changes in balance, headaches Skin: Negative; No rashes or skin lesions Psychiatric: Negative; No behavioral problems, depression Sleep: Negative; No daytime sleepiness, hypersomnolence, bruxism, restless legs, hypnogagnic hallucinations Other comprehensive 14 point system review is negative   Physical Exam BP 178/70  Pulse 65  Ht 5\' 7"  (1.702 m)  Wt 179 lb 4.8 oz (81.33 kg)  BMI 28.08 kg/m2 Repeat blood pressure by me 180/80 General: Alert, oriented, no distress.  Skin: normal turgor, no rashes, warm and dry HEENT:  Normocephalic, atraumatic. Pupils equal round and reactive to light; sclera anicteric; extraocular muscles intact; Fundi not well visualized Nose without nasal septal hypertrophy Mouth/Parynx benign; Mallinpatti scale 3 Neck: Bilateral carotid bruits, right more high-pitched, left more harsh Lungs: clear to ausculatation and percussion; no wheezing or rales Chest wall: without tenderness to palpitation Heart: PMI not displaced, RRR, s1 s2 normal, 2/6 systolic murmur, no diastolic murmur, no rubs, gallops, thrills, or heaves Abdomen: soft, nontender; no hepatosplenomehaly, BS+; abdominal aorta nontender and not dilated by palpation. Back: no CVA tenderness Pulses 2+ Musculoskeletal: full range of motion, normal strength, no joint deformities Extremities: 1+ pitting edema on the right and 1+ on the left lower extremity, slightly improved from previous; no clubbing cyanosis, Homan's sign negative  Neurologic: grossly nonfocal; Cranial nerves grossly wnl Psychologic: Normal mood and affect   02/16/2014 ECG (independently read by me): Normal sinus rhythm at 76 beats per minute.  PR interval 126 ms.  QTc interval 434 ms. Borderline LVH by voltage in aVL  LABS:  BMET    Component Value Date/Time   NA 141 02/13/2014 0859   K 3.7 02/13/2014 0859   CL 98 02/13/2014 0859   CO2 29  02/13/2014 0859   GLUCOSE 120* 02/13/2014 0859   BUN 32* 02/13/2014 0859   CREATININE 1.77* 02/13/2014 0859   CREATININE 1.28 08/03/2013 0350   CALCIUM 8.9 02/13/2014 0859   GFRNONAA 45* 08/03/2013 0350   GFRAA 52* 08/03/2013 0350     Hepatic Function Panel     Component Value Date/Time   PROT 5.7* 02/13/2014 0859   ALBUMIN 3.6 02/13/2014 0859   AST 32 02/13/2014 0859   ALT 32 02/13/2014 0859   ALKPHOS 52 02/13/2014 0859   BILITOT 1.1 02/13/2014 0859     CBC    Component Value Date/Time   WBC 8.5 02/13/2014 0859   RBC 3.36* 02/13/2014 0859   HGB 10.3* 02/13/2014 0859   HCT 29.7* 02/13/2014 0859   PLT 247 02/13/2014 0859   MCV 88.4 02/13/2014 0859   MCH 30.7 02/13/2014 0859   MCHC 34.7 02/13/2014 0859   RDW 16.4* 02/13/2014 0859   LYMPHSABS 0.9 07/30/2013 1729   MONOABS 0.6 07/30/2013 1729   EOSABS 0.1 07/30/2013 1729   BASOSABS 0.0 07/30/2013 1729     BNP    Component Value Date/Time   PROBNP 1356.0* 07/30/2013 1729    Lipid Panel     Component Value Date/Time   CHOL 177 02/13/2014 0859      RADIOLOGY: No results found.   ASSESSMENT AND PLAN: Mr. Ran Tullis is a 80 year old gentleman, who remains very sharp mentally with excellent recall.  He has previously documented severe carotid atherosclerosis.  Recently, he has noticed increasing episodes of shortness of breath and also has significant development of lower extremity edema.  He does have somewhat labile blood pressure and had been on amlodipine 5 mg in addition to Lasix 20 mg.  He has been on simvastatin 40 mg.  He does have myasthenia gravis and has been on prednisone in addition to Mestinon.  I last saw him, I increased his furosemide to 40 mg, which has improved.  His edema.  He also takes HCTZ.  I suggested he increase this to 25 mg.  I discussed with him in detail.  His most recent carotid duplex scan.  He does have more progressive.  Doppler velocities suggest more severe right internal carotid stenosis.  I  discussed his nuclear perfusion study, which revealed normal perfusion, as  well as his echo, which again confirmed normal function.  He was wondering about staying on Plavix.  I advised strongly that he must continue aspirin, Plavix, to reduce potential occlusion.  It also is raised with him the possibility of consideration of carotid stenting in light of his progressive stenosis.  His blood pressure is elevated.  On any Lopressor 12.5 mg twice a day to his medical regimen.  I'm also referring him to be evaluated by Dr. Gwenlyn Found for consideration of candidacy for carotid artery stenting.  I will defer to Dr. Gwenlyn Found and, presently, will not order any MRA evaluation prior to that evaluation.  Troy Sine, MD, [facility] 03/31/2014 1:51 PM

## 2014-03-31 NOTE — Progress Notes (Signed)
Discussed at 03/31/14 office appointment

## 2014-04-12 ENCOUNTER — Encounter: Payer: Self-pay | Admitting: Neurology

## 2014-04-12 ENCOUNTER — Ambulatory Visit (INDEPENDENT_AMBULATORY_CARE_PROVIDER_SITE_OTHER): Payer: Medicare Other | Admitting: Neurology

## 2014-04-12 VITALS — BP 130/56 | HR 73 | Wt 186.0 lb

## 2014-04-12 DIAGNOSIS — I6521 Occlusion and stenosis of right carotid artery: Secondary | ICD-10-CM

## 2014-04-12 DIAGNOSIS — I6529 Occlusion and stenosis of unspecified carotid artery: Secondary | ICD-10-CM

## 2014-04-12 DIAGNOSIS — G7000 Myasthenia gravis without (acute) exacerbation: Secondary | ICD-10-CM

## 2014-04-12 NOTE — Patient Instructions (Signed)
Myasthenia Gravis Myasthenia gravis is a disease that causes muscle weakness throughout the body. The muscles affected are the ones we can control (voluntary muscles). An example of a voluntary muscle is your hand muscles. You can control the muscles to make the hand pick something up. An example of an involuntary muscle is the heart. The heart beats without any direction from you.  Myasthenia Gravis is thought to be an autoimmune disease. That means that normal defenses of the body begin to attack the body. In this case, the immune system begins to attack cells located at the junctions of the muscles and the nerves. Women are affected more often. Women are affected at a younger age than men. Babies born to affected women frequently develop symptoms at an early age. SYMPTOMS Initially in the disease, the facial muscles are affected first. After this, a person may develop droopy eyelids. They may have difficulty controlling facial muscles. They may have problems chewing. Swallowing and speaking may become impaired. The weakness gradually spreads to the arms and legs. It begins to affect breathing. Sometimes, the symptoms lessen or go away without any apparent cause. DIAGNOSIS  Diagnosis can be made with blood tests. Tests such as electromyography may be done to examine the electrical activity in the muscle. An improvement in symptoms after having an anti-cholinesterase drug helps confirm the diagnosis.  TREATMENT  Medicines are usually prescribed as the first treatment. These medicines help, but they do not cure the disease. A plasma cleansing procedure (plasmapheresis) can be used to treat a crisis. It can also be used to prepare a person for surgery. This procedure produces short-term improvement. Some cases are helped by removing the thymus gland. Steroids are used for short-term benefits. Document Released: 11/10/2000 Document Revised: 10/27/2011 Document Reviewed: 10/05/2013 [facility] Patient  Information 2015 [place], [place]. This information is not intended to replace advice given to you by your health care provider. Make sure you discuss any questions you have with your health care provider.

## 2014-04-12 NOTE — Progress Notes (Signed)
Reason for visit: Myasthenia gravis  Marvin [name] is an 80 y.o. male  History of present illness:  Marvin [name] is a 80 year old right-handed white male with a history of myasthenia gravis primarily with ocular features. Marvin [name] is on Mestinon only for his myasthenia. When he was seen in June of 2013, Marvin [name] reported some increased shortness of breath and some swallowing problems. Marvin [name] was seen by cardiology, and carotid Doppler studies have shown some progression of Marvin stenosis of Marvin right internal carotid artery which was in Marvin 80-99% range. Marvin [name] will be seeing Dr. Gwenlyn Found in Marvin near future for considerations of carotid artery stenting. Marvin [name] remains on Mestinon taking 60 mg 3 times daily. He indicates that Marvin shortness of breath and swallowing issues have improved. He does have some problems with generalized fatigue. He reports no falls, he uses a walker for ambulation. He is independent for his activities of daily living. He has decreased vision in Marvin left eye, and he reports some blurring of vision in Marvin right eye, no double vision. Overall, he believes that he is at his baseline.  Past Medical History  Diagnosis Date  . Hyperlipidemia   . Hypertension   . Dizziness   . Blood clot in vein   . Arthritis   . Peripheral arterial disease   . Carotid artery occlusion   . Ocular myasthenia gravis   . BPH (benign prostatic hyperplasia)   . Nephrolithiasis   . Low back pain   . Gait disorder   . Hx of radiation therapy 07/06/12, 07/08/12, 07/13/12    left upper lung nodule, 3 fractions  . Lung cancer 05/2012    left  . Hearing difficulty   . Lumbago   . Abnormality of gait 05/09/2013  . Hearing deficit     right hearing aid  . Peripheral edema     Past Surgical History  Procedure Laterality Date  . Appendectomy    . Cataract extraction      bilateral  . Retinal detachment surgery  02/2011  . Thyroidectomy, partial    . Transthoracic  echocardiogram  01/15/2011    EF=>55%, mild conc LVH; mild MR & mild mitral annular calcification; mild TR, RVSP 30-54mmHg; aortic root sclerosis/calcification  . Carotid doppler  04/2011    80-99% stenosis of right prox to mid ICA    Family History  Problem Relation Age of Onset  . Bone cancer Mother   . Pneumonia Father   . Stroke Sister   . Hypertension Sister   . Heart disease Sister     Social history:  reports that he quit smoking about 57 years ago. His smoking use included Cigarettes. He has a 60 pack-year smoking history. He has never used smokeless tobacco. He reports that he does not drink alcohol or use illicit drugs.    Allergies  Allergen Reactions  . Hydrocodone     unknown    Medications:  Current Outpatient Prescriptions on File Prior to Visit  Medication Sig Dispense Refill  . acetaminophen (TYLENOL) 325 MG tablet Take 2 tablets (650 mg total) by mouth every 6 (six) hours as needed for mild pain (or Fever >/= 101).  30 tablet  0  . amLODipine (NORVASC) 5 MG tablet Take 1 tablet (5 mg total) by mouth daily.  30 tablet  0  . aspirin 81 MG chewable tablet Chew 81 mg by mouth every morning.       . clopidogrel (PLAVIX)  75 MG tablet Take 1 tablet (75 mg total) by mouth daily.  30 tablet  11  . furosemide (LASIX) 20 MG tablet Take 20-40 mg by mouth every morning. Takes and additional  40 mg in Marvin evening as needed      . hydrochlorothiazide (HYDRODIURIL) 25 MG tablet Take 25 mg by mouth daily.       Marland Kitchen ketoconazole (NIZORAL) 2 % cream Apply 1 application topically daily.      . Multiple Vitamin (MULTIVITAMIN WITH MINERALS) TABS Take 1 tablet by mouth every morning.       . predniSONE (DELTASONE) 5 MG tablet TAKE 1 TABLET BY MOUTH THREE TIMES DAILY  270 tablet  1  . pyridostigmine (MESTINON) 60 MG tablet Take 1 tablet (60 mg total) by mouth 3 (three) times daily.  90 tablet  5  . simvastatin (ZOCOR) 40 MG tablet Take 20 mg by mouth at bedtime.       . Tamsulosin HCl  (FLOMAX) 0.4 MG CAPS Take 0.4 mg by mouth at bedtime.      . Vitamin D, Ergocalciferol, (DRISDOL) 50000 UNITS CAPS Take 50,000 Units by mouth every 7 (seven) days. On Sundays      . metoprolol tartrate (LOPRESSOR) 25 MG tablet Take 0.5 tablets (12.5 mg total) by mouth 2 (two) times daily.  60 tablet  6   No current facility-administered medications on file prior to visit.    ROS:  Out of a complete 14 system review of symptoms, Marvin [name] complains only of Marvin following symptoms, and all other reviewed systems are negative.  Loss of vision Cough Leg swelling Insomnia Frequency of urination Walking difficulties Bruising easily, dizziness  Blood pressure 130/56, pulse 73, weight 186 lb (84.369 kg).  Physical Exam  General: Marvin [name] is alert and cooperative at Marvin time of Marvin examination.  Skin: 3+ edema is noted below Marvin knees bilaterally, slightly worse on Marvin right than Marvin left.   Neurologic Exam  Mental status: Marvin [name] is oriented x 3.  Cranial nerves: Facial symmetry is present. Speech is normal, no aphasia or dysarthria is noted. Extraocular movements are full. Visual fields are full. With superior gaze for 1 minute, no increased ptosis or divergent gaze is seen.  Motor: Marvin [name] has good strength in all 4 extremities, with exception that Marvin [name] has bilateral foot drops, slight weakness of Marvin intrinsic muscles bilaterally. With Marvin arms outstretched 1 minute, Marvin [name] is able to keep Marvin arms up, but there is 4/5 strength in Marvin deltoid muscles after 1 minute.  Sensory examination: Soft touch sensation is symmetric on Marvin face, arms, or legs.  Coordination: Marvin [name] has good finger-nose-finger and heel-to-shin bilaterally.  Gait and station: Marvin [name] requires assistance with standing from a seated position. Once up, Marvin [name] is able to ambulate with a walker. Tandem gait was not attempted.  Reflexes: Deep tendon reflexes are symmetric, but  are depressed.   Assessment/Plan:  1. Myasthenia gravis, ocular features  2. Gait disorder  3. Bilateral carotid stenosis, right greater than left  4. Bilateral foot drops  Marvin [name] will continue on Mestinon at this time. Marvin [name] indicates that his ability to chew and swallow is unchanged, no new visual complaints. Marvin [name] will followup in about 4 months.  Jill Alexanders MD 04/12/2014 10:02 PM  [facility] [address] [place], [place] [address]  Phone [phone] Fax [phone]

## 2014-04-13 ENCOUNTER — Telehealth: Payer: Self-pay | Admitting: *Deleted

## 2014-04-13 NOTE — Telephone Encounter (Signed)
Marvin [name] called to relay that pt has had 2 falls since seen yesterday.  (both legs just have given out) once at grocery store and the other in kitchen.    Not hurt (just minor scratches).  Does use mobility device.  FYI. [phone].   Just seen yesterday for RV.

## 2014-04-13 NOTE — Telephone Encounter (Signed)
I called patient. He fell twice yesterday. The patient indicated that he had a sensation of weakness in the legs, with collapse. I have indicated that physical therapy could potentially help, but he does not appear to be interested in doing this at this time, he will call me if he is amenable to having therapy done. He cut his hand with one of the falls.

## 2014-04-25 ENCOUNTER — Telehealth: Payer: Self-pay | Admitting: Cardiovascular Disease

## 2014-04-25 NOTE — Telephone Encounter (Signed)
Pt 's daughter called in stating that she feels that  Marvin [name] is having some issues since Dr. Claiborne Billings has prescribed Lasix and Plavix. She stated that he is having some confusion,disorientation, no energy, and a fall since being on these medications. [name] would like to know if he could come off this medication or if the dosage could be lowered until he comes in for his appt with Dr. Gwenlyn Found on 9/30. Please call  Thanks

## 2014-04-25 NOTE — Telephone Encounter (Signed)
This pt.s daughter states her father has been very weak and wanted to know if he could stop some of his medications   He's had a resent fall  He has appt with Dr. Gwenlyn Found on 9/30

## 2014-04-26 MED ORDER — HYDROCHLOROTHIAZIDE 25 MG PO TABS: ORAL_TABLET | ORAL | Status: DC

## 2014-04-26 MED ORDER — METOPROLOL TARTRATE 25 MG PO TABS: ORAL_TABLET | ORAL | Status: DC

## 2014-04-26 NOTE — Telephone Encounter (Signed)
Would not stop plavix. Ok to reduce HCTZ and decrease metoprolol to daily to see if improves symptoms

## 2014-04-26 NOTE — Telephone Encounter (Signed)
Spoke to daughter [name] Dr.Kelly advised continue plavix.Advised ok to reduce hctz and metoprolol to daily.Advised to call back if symptoms don't improve.

## 2014-04-26 NOTE — Telephone Encounter (Signed)
Returned call to patient's daughter [place] [name] no answer.[place].

## 2014-04-26 NOTE — Telephone Encounter (Signed)
Spoke to daughter [name] she stated she is concerned about her father.Stated last time he saw Dr.Kelly plavix 75 mg daily started and hctz increased to 25 mg daily.Lopressor 12.5 mg twice a day started.Stated ever since these changes father became disoriented one day.Father has been tired,no energy.Stated this is new for him.Stated she wanted to ask Dr.Kelly if he needs to change medications,can he just take a aspirin and stop plavix.,and can hctz and lopressor be reduced.Message sent to [facility] for advice.

## 2014-04-27 ENCOUNTER — Encounter: Payer: Self-pay | Admitting: Radiation Oncology

## 2014-04-27 ENCOUNTER — Ambulatory Visit
Admission: RE | Admit: 2014-04-27 | Discharge: 2014-04-27 | Disposition: A | Discharge status: Home / Self Care | Payer: Medicare Other | Source: Ambulatory Visit | Attending: Radiation Oncology | Admitting: Radiation Oncology

## 2014-04-27 ENCOUNTER — Telehealth: Payer: Self-pay | Admitting: *Deleted

## 2014-04-27 VITALS — BP 150/63 | HR 70 | Temp 97.7°F | Resp 16 | Ht 67.0 in | Wt 185.8 lb

## 2014-04-27 DIAGNOSIS — C3412 Malignant neoplasm of upper lobe, left bronchus or lung: Secondary | ICD-10-CM

## 2014-04-27 NOTE — Progress Notes (Signed)
Marvin [name] here for follow up after treatment to his left upper lung.  He denies pain but does have occasional sharp pains in his left chest with movement.  He denies a cough.  He reports shortness of breath occasionally.  He reports fatigue.  He denies any trouble swallowing.

## 2014-04-27 NOTE — Telephone Encounter (Signed)
CALLED PATIENT TO INFORM OF CT FOR 05-05-14- ARRIVAL TIME- 11:15 AM @ [facility], SPOKE WITH PATIENT AND HE IS AWARE OF THIS TEST.

## 2014-04-27 NOTE — Progress Notes (Signed)
Radiation Oncology         [phone] ________________________________  Name: Marvin [name] MRN: [id number]  Date: 04/27/2014  DOB: Sep 14, 1913  Follow-Up Visit Note  CC: Thressa Sheller, MD  Thressa Sheller, MD  Diagnosis:   Clinical stage I non-small cell lung cancer   Interval Since Last Radiation:  One year and 10 months, s/p SBRT  Narrative:  The patient returns today for routine follow-up.  He is doing well at this time. He denies any significant pain within the chest cough or hemoptysis. He denies any breathing problems.                           ALLERGIES:  is allergic to hydrocodone.  Meds: Current Outpatient Prescriptions  Medication Sig Dispense Refill  . acetaminophen (TYLENOL) 325 MG tablet Take 2 tablets (650 mg total) by mouth every 6 (six) hours as needed for mild pain (or Fever >/= 101).  30 tablet  0  . amLODipine (NORVASC) 5 MG tablet Take 1 tablet (5 mg total) by mouth daily.  30 tablet  0  . aspirin 81 MG chewable tablet Chew 81 mg by mouth every morning.       . clopidogrel (PLAVIX) 75 MG tablet Take 1 tablet (75 mg total) by mouth daily.  30 tablet  11  . furosemide (LASIX) 20 MG tablet Take 20-40 mg by mouth every morning. Takes and additional  40 mg in the evening as needed      . hydrochlorothiazide (HYDRODIURIL) 25 MG tablet Take 25 mg by mouth daily.       Marland Kitchen ketoconazole (NIZORAL) 2 % cream Apply 1 application topically daily.      . metoprolol tartrate (LOPRESSOR) 25 MG tablet Take 1/2 tablet daily  30 tablet  6  . Multiple Vitamin (MULTIVITAMIN WITH MINERALS) TABS Take 1 tablet by mouth every morning.       . predniSONE (DELTASONE) 5 MG tablet TAKE 1 TABLET BY MOUTH THREE TIMES DAILY  270 tablet  1  . pyridostigmine (MESTINON) 60 MG tablet Take 1 tablet (60 mg total) by mouth 3 (three) times daily.  90 tablet  5  . simvastatin (ZOCOR) 40 MG tablet Take 20 mg by mouth at bedtime.       . Tamsulosin HCl (FLOMAX) 0.4 MG CAPS Take 0.4 mg by mouth  at bedtime.      . Vitamin D, Ergocalciferol, (DRISDOL) 50000 UNITS CAPS Take 50,000 Units by mouth every 7 (seven) days. On Sundays      . hydrochlorothiazide (HYDRODIURIL) 25 MG tablet Take 1/2 tablet daily  30 tablet  6   No current facility-administered medications for this encounter.    Physical Findings: The patient is in no acute distress. Patient is alert and oriented.  height is 5\' 7"  (1.702 m) and weight is 185 lb 12.8 oz (84.278 kg). His oral temperature is 97.7 F (36.5 C). His blood pressure is 150/63 and his pulse is 70. His respiration is 16 and oxygen saturation is 95%. . No palpable supraclavicular or axillary adenopathy. The lungs are clear to auscultation. The heart has a regular rhythm and rate  Lab Findings: Lab Results  Component Value Date   WBC 8.5 02/13/2014   HGB 10.3* 02/13/2014   HCT 29.7* 02/13/2014   MCV 88.4 02/13/2014   PLT 247 02/13/2014      Radiographic Findings: No results found.  Impression:  No evidence of recurrence on exam today,  status post SBRT for clinical stage I non-small cell lung cancer.  Plan:  Chest CT scan without contrast, routine followup in 6 months.  ____________________________________ Blair Promise, MD

## 2014-05-05 ENCOUNTER — Ambulatory Visit (HOSPITAL_COMMUNITY)
Admission: RE | Admit: 2014-05-05 | Discharge: 2014-05-05 | Disposition: A | Discharge status: Home / Self Care | Payer: Medicare Other | Source: Ambulatory Visit | Attending: Radiation Oncology | Admitting: Radiation Oncology

## 2014-05-05 ENCOUNTER — Encounter (HOSPITAL_COMMUNITY): Payer: Self-pay

## 2014-05-05 DIAGNOSIS — C3412 Malignant neoplasm of upper lobe, left bronchus or lung: Secondary | ICD-10-CM

## 2014-05-05 DIAGNOSIS — C3410 Malignant neoplasm of upper lobe, unspecified bronchus or lung: Secondary | ICD-10-CM | POA: Insufficient documentation

## 2014-05-17 ENCOUNTER — Ambulatory Visit (INDEPENDENT_AMBULATORY_CARE_PROVIDER_SITE_OTHER): Payer: Medicare Other | Admitting: Cardiovascular Disease

## 2014-05-17 ENCOUNTER — Encounter: Payer: Self-pay | Admitting: Cardiovascular Disease

## 2014-05-17 VITALS — BP 138/52 | HR 69 | Ht 68.0 in | Wt 178.0 lb

## 2014-05-17 DIAGNOSIS — I658 Occlusion and stenosis of other precerebral arteries: Secondary | ICD-10-CM

## 2014-05-17 DIAGNOSIS — I6529 Occlusion and stenosis of unspecified carotid artery: Secondary | ICD-10-CM

## 2014-05-17 DIAGNOSIS — I6523 Occlusion and stenosis of bilateral carotid arteries: Secondary | ICD-10-CM

## 2014-05-17 DIAGNOSIS — I10 Essential (primary) hypertension: Secondary | ICD-10-CM

## 2014-05-17 NOTE — Assessment & Plan Note (Signed)
Marvin [name] is a delightful 80 year old gentleman referred by Dr. Claiborne Billings for evaluation of asymptomatic bilateral carotid artery stenosis right greater than left. His Dopplers recently performed shows significant progression of disease compared to his last Dopplers 2 years ago now with a right ICA stenosis probably in the 95% range. He does have normal LV function by 2-D echo and a low-risk nonischemic Myoview in July. He is also on aspirin Plavix. He is neurologically asymptomatic. Based on his age I do not feel intervention is warranted rather conservative medical therapy.

## 2014-05-17 NOTE — Progress Notes (Signed)
05/17/2014 REES SANTISTEVAN   04/06/1914  [id number]  Primary Physician Thressa Sheller, MD Primary Cardiologist: Lorretta Harp MD Renae Gloss   HPI:  Mr. Faulk is a delightful 80 year old gentleman accompanied by his daughter referred by Dr. Claiborne Billings for evaluation of asymptomatic high grade right internal carotid artery stenosis. He is a World War II veteran. He has a history of hypertension and hyperlipidemia. He has never had a heart attack or stroke. He has had carotid Dopplers performed over the years which has not shown progression of disease in the last 2 years now with high-grade right internal carotid artery stenosis and moderate left internal carotid stenosis. He is neurologically asymptomatic. He denies chest pain or shortness of breath. He lives alone and performs his activities of daily living and walks with a walker.   Current Outpatient Prescriptions  Medication Sig Dispense Refill  . acetaminophen (TYLENOL) 325 MG tablet Take 2 tablets (650 mg total) by mouth every 6 (six) hours as needed for mild pain (or Fever >/= 101).  30 tablet  0  . amLODipine (NORVASC) 2.5 MG tablet Take 2.5 mg by mouth daily.      Marland Kitchen aspirin 81 MG chewable tablet Chew 81 mg by mouth every morning.       . clopidogrel (PLAVIX) 75 MG tablet Take 1 tablet (75 mg total) by mouth daily.  30 tablet  11  . FUROSEMIDE PO Take 60 mg by mouth every morning.      . hydrochlorothiazide (HYDRODIURIL) 25 MG tablet Take 25 mg by mouth daily.       Marland Kitchen ketoconazole (NIZORAL) 2 % cream Apply 1 application topically daily.      . metoprolol tartrate (LOPRESSOR) 25 MG tablet Take 1/2 tablet daily  30 tablet  6  . Multiple Vitamin (MULTIVITAMIN WITH MINERALS) TABS Take 1 tablet by mouth every morning.       . predniSONE (DELTASONE) 5 MG tablet TAKE 1 TABLET BY MOUTH THREE TIMES DAILY  270 tablet  1  . pyridostigmine (MESTINON) 60 MG tablet Take 1 tablet (60 mg total) by mouth 3 (three) times daily.  90  tablet  5  . simvastatin (ZOCOR) 5 MG tablet Take 5 mg by mouth at bedtime.      . Tamsulosin HCl (FLOMAX) 0.4 MG CAPS Take 0.4 mg by mouth at bedtime.      . Vitamin D, Ergocalciferol, (DRISDOL) 50000 UNITS CAPS Take 50,000 Units by mouth every 7 (seven) days. On Sundays       No current facility-administered medications for this visit.    Allergies  Allergen Reactions  . Hydrocodone     unknown    History   Social History  . Marital Status: Widowed    Spouse Name: N/A    Number of Children: 3  . Years of Education: HS   Occupational History  .      Retired   Social History Main Topics  . Smoking status: Former Smoker -- 1.50 packs/day for 40 years    Types: Cigarettes    Quit date: 03/27/1957  . Smokeless tobacco: Never Used     Comment: Quit in 1958  . Alcohol Use: No  . Drug Use: No  . Sexual Activity: Not on file   Other Topics Concern  . Not on file   Social History Narrative   Patient lives at [employer].    Caffeine Use- Does consume   Patient is retired.   Patient has some  college education.     Review of Systems: General: negative for chills, fever, night sweats or weight changes.  Cardiovascular: negative for chest pain, dyspnea on exertion, edema, orthopnea, palpitations, paroxysmal nocturnal dyspnea or shortness of breath Dermatological: negative for rash Respiratory: negative for cough or wheezing Urologic: negative for hematuria Abdominal: negative for nausea, vomiting, diarrhea, bright red blood per rectum, melena, or hematemesis Neurologic: negative for visual changes, syncope, or dizziness All other systems reviewed and are otherwise negative except as noted above.    Blood pressure 138/52, pulse 69, height 5\' 8"  (1.727 m), weight 178 lb (80.74 kg).  General appearance: alert and no distress Neck: no adenopathy, no JVD, supple, symmetrical, trachea midline, thyroid not enlarged, symmetric, no tenderness/mass/nodules and bilateral  carotid bruits left louder the right Lungs: clear to auscultation bilaterally Heart: regular rate and rhythm, S1, S2 normal, no murmur, click, rub or gallop Extremities: 2-3+ pitting edema the venous stasis ulcer on the medial aspect of the right pretibial area  EKG sinus rhythm at 69 without ST or T wave changes  ASSESSMENT AND PLAN:   Carotid stenosis Mr. Asche is a delightful 80 year old gentleman referred by Dr. Claiborne Billings for evaluation of asymptomatic bilateral carotid artery stenosis right greater than left. His Dopplers recently performed shows significant progression of disease compared to his last Dopplers 2 years ago now with a right ICA stenosis probably in the 95% range. He does have normal LV function by 2-D echo and a low-risk nonischemic Myoview in July. He is also on aspirin Plavix. He is neurologically asymptomatic. Based on his age I do not feel intervention is warranted rather conservative medical therapy.      Lorretta Harp MD FACP,FACC,FAHA, [facility] 05/17/2014 12:31 PM

## 2014-05-17 NOTE — Patient Instructions (Signed)
Follow up with Dr Berry as needed.  

## 2014-06-16 ENCOUNTER — Encounter (HOSPITAL_COMMUNITY): Payer: Self-pay | Admitting: Emergency Medicine

## 2014-06-16 ENCOUNTER — Emergency Department (HOSPITAL_COMMUNITY)
Admission: EM | Admit: 2014-06-16 | Discharge: 2014-06-17 | Disposition: A | Discharge status: Home / Self Care | Payer: Medicare Other | Attending: Emergency Medicine | Admitting: Emergency Medicine

## 2014-06-16 DIAGNOSIS — Z87442 Personal history of urinary calculi: Secondary | ICD-10-CM | POA: Diagnosis not present

## 2014-06-16 DIAGNOSIS — Z923 Personal history of irradiation: Secondary | ICD-10-CM | POA: Insufficient documentation

## 2014-06-16 DIAGNOSIS — Z85118 Personal history of other malignant neoplasm of bronchus and lung: Secondary | ICD-10-CM | POA: Insufficient documentation

## 2014-06-16 DIAGNOSIS — E785 Hyperlipidemia, unspecified: Secondary | ICD-10-CM | POA: Diagnosis not present

## 2014-06-16 DIAGNOSIS — H9190 Unspecified hearing loss, unspecified ear: Secondary | ICD-10-CM | POA: Diagnosis not present

## 2014-06-16 DIAGNOSIS — F4325 Adjustment disorder with mixed disturbance of emotions and conduct: Secondary | ICD-10-CM | POA: Diagnosis present

## 2014-06-16 DIAGNOSIS — Z79899 Other long term (current) drug therapy: Secondary | ICD-10-CM | POA: Diagnosis not present

## 2014-06-16 DIAGNOSIS — Z9841 Cataract extraction status, right eye: Secondary | ICD-10-CM | POA: Diagnosis not present

## 2014-06-16 DIAGNOSIS — N400 Enlarged prostate without lower urinary tract symptoms: Secondary | ICD-10-CM | POA: Insufficient documentation

## 2014-06-16 DIAGNOSIS — Z7952 Long term (current) use of systemic steroids: Secondary | ICD-10-CM | POA: Diagnosis not present

## 2014-06-16 DIAGNOSIS — Z87891 Personal history of nicotine dependence: Secondary | ICD-10-CM | POA: Insufficient documentation

## 2014-06-16 DIAGNOSIS — Z8669 Personal history of other diseases of the nervous system and sense organs: Secondary | ICD-10-CM | POA: Insufficient documentation

## 2014-06-16 DIAGNOSIS — I10 Essential (primary) hypertension: Secondary | ICD-10-CM | POA: Diagnosis not present

## 2014-06-16 DIAGNOSIS — T391X2A Poisoning by 4-Aminophenol derivatives, intentional self-harm, initial encounter: Secondary | ICD-10-CM | POA: Insufficient documentation

## 2014-06-16 DIAGNOSIS — Z7982 Long term (current) use of aspirin: Secondary | ICD-10-CM | POA: Diagnosis not present

## 2014-06-16 DIAGNOSIS — R45851 Suicidal ideations: Secondary | ICD-10-CM

## 2014-06-16 DIAGNOSIS — Z9842 Cataract extraction status, left eye: Secondary | ICD-10-CM | POA: Diagnosis not present

## 2014-06-16 DIAGNOSIS — Z7902 Long term (current) use of antithrombotics/antiplatelets: Secondary | ICD-10-CM | POA: Insufficient documentation

## 2014-06-16 DIAGNOSIS — F329 Major depressive disorder, single episode, unspecified: Secondary | ICD-10-CM

## 2014-06-16 DIAGNOSIS — Z86711 Personal history of pulmonary embolism: Secondary | ICD-10-CM | POA: Diagnosis not present

## 2014-06-16 DIAGNOSIS — M1990 Unspecified osteoarthritis, unspecified site: Secondary | ICD-10-CM | POA: Diagnosis not present

## 2014-06-16 LAB — COMPREHENSIVE METABOLIC PANEL
ALBUMIN: 3.2 g/dL — AB (ref 3.5–5.2)
ALT: 36 U/L (ref 0–53)
ANION GAP: 15 (ref 5–15)
AST: 30 U/L (ref 0–37)
Alkaline Phosphatase: 81 U/L (ref 39–117)
BUN: 44 mg/dL — AB (ref 6–23)
CO2: 30 mEq/L (ref 19–32)
CREATININE: 2.04 mg/dL — AB (ref 0.50–1.35)
Calcium: 8.9 mg/dL (ref 8.4–10.5)
Chloride: 95 mEq/L — ABNORMAL LOW (ref 96–112)
GFR calc Af Amer: 30 mL/min — ABNORMAL LOW (ref >90)
GFR calc non Af Amer: 25 mL/min — ABNORMAL LOW (ref >90)
Glucose, Bld: 118 mg/dL — ABNORMAL HIGH (ref 70–99)
POTASSIUM: 4.1 meq/L (ref 3.7–5.3)
Sodium: 140 mEq/L (ref 137–147)
TOTAL PROTEIN: 5.9 g/dL — AB (ref 6.0–8.3)
Total Bilirubin: 0.5 mg/dL (ref 0.3–1.2)

## 2014-06-16 LAB — ETHANOL: Alcohol, Ethyl (B): <11 mg/dL (ref 0–11)

## 2014-06-16 LAB — CBC
HCT: 27.8 % — ABNORMAL LOW (ref 39.0–52.0)
Hemoglobin: 8.9 g/dL — ABNORMAL LOW (ref 13.0–17.0)
MCH: 30.1 pg (ref 26.0–34.0)
MCHC: 32 g/dL (ref 30.0–36.0)
MCV: 93.9 fL (ref 78.0–100.0)
Platelets: 220 10*3/uL (ref 150–400)
RBC: 2.96 MIL/uL — ABNORMAL LOW (ref 4.22–5.81)
RDW: 15.7 % — AB (ref 11.5–15.5)
WBC: 8.1 10*3/uL (ref 4.0–10.5)

## 2014-06-16 LAB — ACETAMINOPHEN LEVEL: Acetaminophen (Tylenol), Serum: 51.4 ug/mL — ABNORMAL HIGH (ref 10–30)

## 2014-06-16 LAB — SALICYLATE LEVEL

## 2014-06-16 MED ORDER — SODIUM CHLORIDE 0.9 % IV BOLUS (SEPSIS)
500.0000 mL | Freq: Once | INTRAVENOUS | Status: AC
  Administered 2014-06-16: 500 mL via INTRAVENOUS

## 2014-06-16 NOTE — ED Notes (Signed)
Patient arrived to unit via wheelchair. Report obtained from Ander Purpura, [place]. Pt to ED for SI/depression. Pt states to this RN "I'm just tired, I don't care anymore". Pt with dull, flat affect endorsing depression. Pt verbally contracts for safety while on unit. Charge nurse aware of need for sitter. No s/s of distress noted.

## 2014-06-16 NOTE — ED Provider Notes (Signed)
CSN: [id number]     Arrival date & time 06/16/14  1323 History   First MD Initiated Contact with Patient 06/16/14 1421     Chief Complaint  Patient presents with  . Ingestion     (Consider location/radiation/quality/duration/timing/severity/associated sxs/prior Treatment) HPI Pt presenting after intentional ingestion of tylenol.  He states he became tired of living and wanted to kill himself.  He says "it seemed like a good idea at the time".  He states he counted out 10 tablets of tylenol and took them last night at 10pm.  He had no ill effects from this, so this morning took 10 more tablets of tylenol at 8am.  Pt denies abdominal pain, no nausea or vomiting.  Has no current symptoms.  Pt states his life is miserable, he lives alone, never has anyone to talk to and sometimes just doesn't want to go on.  He denies recent fevers or infections.  Denies substance abuse.  Lives in an independent living situation at [facility].  There are no other associated systemic symptoms, there are no other alleviating or modifying factors.   Past Medical History  Diagnosis Date  . Hyperlipidemia   . Hypertension   . Dizziness   . Blood clot in vein   . Arthritis   . Peripheral arterial disease   . Carotid artery occlusion   . Ocular myasthenia gravis   . BPH (benign prostatic hyperplasia)   . Nephrolithiasis   . Low back pain   . Gait disorder   . Hx of radiation therapy 07/06/12, 07/08/12, 07/13/12    left upper lung nodule, 3 fractions  . Hearing difficulty   . Lumbago   . Abnormality of gait 05/09/2013  . Hearing deficit     right hearing aid  . Peripheral edema   . Lung cancer 05/2012    left   Past Surgical History  Procedure Laterality Date  . Appendectomy    . Cataract extraction      bilateral  . Retinal detachment surgery  02/2011  . Thyroidectomy, partial    . Transthoracic echocardiogram  01/15/2011    EF=>55%, mild conc LVH; mild MR & mild mitral annular calcification; mild  TR, RVSP 30-66mmHg; aortic root sclerosis/calcification  . Carotid doppler  04/2011    80-99% stenosis of right prox to mid ICA   Family History  Problem Relation Age of Onset  . Bone cancer Mother   . Pneumonia Father   . Stroke Sister   . Hypertension Sister   . Heart disease Sister    History  Substance Use Topics  . Smoking status: Former Smoker -- 1.50 packs/day for 40 years    Types: Cigarettes    Quit date: 03/27/1957  . Smokeless tobacco: Never Used     Comment: Quit in 1958  . Alcohol Use: No    Review of Systems ROS reviewed and all otherwise negative except for mentioned in HPI    Allergies  Hydrocodone  Home Medications   Prior to Admission medications   Medication Sig Start Date End Date Taking? Authorizing Provider  acetaminophen (TYLENOL) 325 MG tablet Take 2 tablets (650 mg total) by mouth every 6 (six) hours as needed for mild pain (or Fever >/= 101). 08/02/13  Yes Robbie Lis, MD  amLODipine (NORVASC) 2.5 MG tablet Take 2.5 mg by mouth daily.   Yes Historical Provider, MD  aspirin 81 MG chewable tablet Chew 81 mg by mouth every morning.    Yes Historical Provider,  MD  clopidogrel (PLAVIX) 75 MG tablet Take 1 tablet (75 mg total) by mouth daily. 03/22/14  Yes Troy Sine, MD  furosemide (LASIX) 20 MG tablet Take 20 mg by mouth daily. At noon   Yes Historical Provider, MD  furosemide (LASIX) 40 MG tablet Take 40 mg by mouth every morning.   Yes Historical Provider, MD  hydrochlorothiazide (HYDRODIURIL) 25 MG tablet Take 25 mg by mouth daily.    Yes Historical Provider, MD  ketoconazole (NIZORAL) 2 % cream Apply 1 application topically daily as needed for irritation.    Yes Historical Provider, MD  metoprolol tartrate (LOPRESSOR) 25 MG tablet Take 1/2 tablet daily 04/26/14  Yes Troy Sine, MD  Multiple Vitamin (MULTIVITAMIN WITH MINERALS) TABS Take 1 tablet by mouth every morning.    Yes Historical Provider, MD  predniSONE (DELTASONE) 5 MG tablet TAKE 1  TABLET BY MOUTH THREE TIMES DAILY 09/29/13  Yes Kathrynn Ducking, MD  pyridostigmine (MESTINON) 60 MG tablet Take 1 tablet (60 mg total) by mouth 3 (three) times daily. 01/30/14  Yes Kathrynn Ducking, MD  simvastatin (ZOCOR) 5 MG tablet Take 5 mg by mouth at bedtime.   Yes Historical Provider, MD  Tamsulosin HCl (FLOMAX) 0.4 MG CAPS Take 0.4 mg by mouth at bedtime.   Yes Historical Provider, MD  Vitamin D, Ergocalciferol, (DRISDOL) 50000 UNITS CAPS Take 50,000 Units by mouth every 7 (seven) days. On Sundays   Yes Historical Provider, MD   BP 163/66  Pulse 59  Temp(Src) 97.8 F (36.6 C)  Resp 16  SpO2 100% Vitals reviewed Physical Exam Physical Examination: General appearance - alert, well appearing, and in no distress Mental status - alert, oriented to person, place, and time Eyes - no conjunctival injection, no scleral icterus Mouth - mucous membranes moist, pharynx normal without lesions Chest - clear to auscultation, no wheezes, rales or rhonchi, symmetric air entry Heart - normal rate, regular rhythm, normal S1, S2, no murmurs, rubs, clicks or gallops Abdomen - soft, nontender, nondistended, no masses or organomegaly Extremities - peripheral pulses normal, no pedal edema, no clubbing or cyanosis Skin - normal coloration and turgor, no rashes Pscyh- normal mood and affect, calm and cooperative  ED Course  Procedures (including critical care time) Labs Review Labs Reviewed  CBC - Abnormal; Notable for the following:    RBC 2.96 (*)    Hemoglobin 8.9 (*)    HCT 27.8 (*)    RDW 15.7 (*)    All other components within normal limits  COMPREHENSIVE METABOLIC PANEL - Abnormal; Notable for the following:    Chloride 95 (*)    Glucose, Bld 118 (*)    BUN 44 (*)    Creatinine, Ser 2.04 (*)    Total Protein 5.9 (*)    Albumin 3.2 (*)    GFR calc non Af Amer 25 (*)    GFR calc Af Amer 30 (*)    All other components within normal limits  ACETAMINOPHEN LEVEL - Abnormal; Notable for  the following:    Acetaminophen (Tylenol), Serum 51.4 (*)    All other components within normal limits  SALICYLATE LEVEL - Abnormal; Notable for the following:    Salicylate Lvl <7.5 (*)    All other components within normal limits  ETHANOL  URINE RAPID DRUG SCREEN (HOSP PERFORMED)    Imaging Review No results found.   EKG Interpretation   Date/Time:  Friday June 16 2014 13:42:38 EDT Ventricular Rate:  58 PR Interval:  200 QRS Duration: 107 QT Interval:  481 QTC Calculation: 472 R Axis:   -3 Text Interpretation:  Sinus rhythm Abnormal R-wave progression, early  transition No significant change since last tracing Confirmed by [facility]   MD, Sanye Ledesma [phone]) on 06/16/2014 2:39:35 PM      MDM   Final diagnoses:  Intentional acetaminophen overdose, initial encounter    Pt presents after intentional overdose of tylenol- doses last night at 10pm and this morning at 8am.  No current complaints.  He does endorse that he was trying to kill himself.  Tylenol level 51.4- which was drawn 6 hours after most recent dose of tylenol- this is well below the nomogram level for toxicity.  TTS consulted.      Threasa Beards, MD 06/16/14 [phone]

## 2014-06-16 NOTE — Consult Note (Signed)
[place] [facility] Consult   Reason for Consult:  Suicide attempt Referring Physician:  ER Physician Marvin [name] is an 80 y.o. male. Total Time spent with patient: 30 minutes  Assessment: AXIS I:  Depression NOS AXIS II:  Deferred AXIS III:   Past Medical History  Diagnosis Date  . Hyperlipidemia   . Hypertension   . Dizziness   . Blood clot in vein   . Arthritis   . Peripheral arterial disease   . Carotid artery occlusion   . Ocular myasthenia gravis   . BPH (benign prostatic hyperplasia)   . Nephrolithiasis   . Low back pain   . Gait disorder   . Hx of radiation therapy 07/06/12, 07/08/12, 07/13/12    left upper lung nodule, 3 fractions  . Hearing difficulty   . Lumbago   . Abnormality of gait 05/09/2013  . Hearing deficit     right hearing aid  . Peripheral edema   . Lung cancer 05/2012    left   AXIS IV: worsening physical health/eye sight, concerned about possible need for a Nursing Home  AXIS V:  11-20 some danger of hurting self or others possible OR occasionally fails to maintain minimal personal hygiene OR gross impairment in communication  Plan:  Recommend psychiatric Inpatient admission when medically cleared.  Subjective:   Marvin [name] is a 80 y.o. male patient admitted with suicide attempt  HPI:   Marvin [name] is a 80 year old man. He is widowed and lives alone.   Patient recently attempted suicide by ingesting 10 Acetaminophen tablets. Upon awakening the next day, he was " upset " about not having died and took another 10 tables, with no untoward effect or symptoms. A friend found out about overdose and he was brought to the hospital. Patient states overdose was not planned out, but that his clear intent was to kill self. He states he is still wanting to die, and cannot contract for safety at this time. He lives in an [place] setting, and up to recently he had been able to function independently with little difficulty.  Gradually, however, he reports it has become harder for him to function in his daily activities. As examples, it has become harder for him to pay his bills and balance his checkbook,  he has more difficulty walking, and now mobilizes with a walker. He  States " I gave up driving a few years ago"  " I am losing my eye sight and it's hard to hear". " I can't make it to the bathroom sometimes". He states he has not felt particularly depressed or sad, and states simply that he realizes that " I am going to end up in a Nursing Home, and I don't want that, so I would rather die". No psychotic symptoms. Denies major changes in appetite, energy, sense of self esteem. Does describe some anhedonia. Psychiatric History- denies any prior history of depression, mania, psychosis,  Or  PTSD. Also denies any history of prior suicide attempts, denies any history of violence, denies any history of prior psychiatric admissions or treatment. Has never been on any psychiatric medications . Denies alcohol or drug abuse. HPI Elements:   Serious overdose and suicidal ideations with plan and intent in the context of worsening physical health and ability to function  Past Psychiatric History: Past Medical History  Diagnosis Date  . Hyperlipidemia   . Hypertension   . Dizziness   . Blood clot in vein   .  Arthritis   . Peripheral arterial disease   . Carotid artery occlusion   . Ocular myasthenia gravis   . BPH (benign prostatic hyperplasia)   . Nephrolithiasis   . Low back pain   . Gait disorder   . Hx of radiation therapy 07/06/12, 07/08/12, 07/13/12    left upper lung nodule, 3 fractions  . Hearing difficulty   . Lumbago   . Abnormality of gait 05/09/2013  . Hearing deficit     right hearing aid  . Peripheral edema   . Lung cancer 05/2012    left    reports that he quit smoking about 57 years ago. His smoking use included Cigarettes. He has a 60 pack-year smoking history. He has never used smokeless tobacco.  He reports that he does not drink alcohol or use illicit drugs. Family History  Problem Relation Age of Onset  . Bone cancer Mother   . Pneumonia Father   . Stroke Sister   . Hypertension Sister   . Heart disease Sister            Allergies:   Allergies  Allergen Reactions  . Hydrocodone     unknown    Objective: Blood pressure 163/66, pulse 59, temperature 97.8 F (36.6 C), resp. rate 16, SpO2 100.00%.There is no weight on file to calculate BMI. Results for orders placed during the hospital encounter of 06/16/14 (from the past 72 hour(s))  CBC     Status: Abnormal   Collection Time    06/16/14  2:00 PM      Result Value Ref Range   WBC 8.1  4.0 - 10.5 K/uL   RBC 2.96 (*) 4.22 - 5.81 MIL/uL   Hemoglobin 8.9 (*) 13.0 - 17.0 g/dL   HCT 27.8 (*) 39.0 - 52.0 %   MCV 93.9  78.0 - 100.0 fL   MCH 30.1  26.0 - 34.0 pg   MCHC 32.0  30.0 - 36.0 g/dL   RDW 15.7 (*) 11.5 - 15.5 %   Platelets 220  150 - 400 K/uL  COMPREHENSIVE METABOLIC PANEL     Status: Abnormal   Collection Time    06/16/14  2:00 PM      Result Value Ref Range   Sodium 140  137 - 147 mEq/L   Potassium 4.1  3.7 - 5.3 mEq/L   Chloride 95 (*) 96 - 112 mEq/L   CO2 30  19 - 32 mEq/L   Glucose, Bld 118 (*) 70 - 99 mg/dL   BUN 44 (*) 6 - 23 mg/dL   Creatinine, Ser 2.04 (*) 0.50 - 1.35 mg/dL   Calcium 8.9  8.4 - 10.5 mg/dL   Total Protein 5.9 (*) 6.0 - 8.3 g/dL   Albumin 3.2 (*) 3.5 - 5.2 g/dL   AST 30  0 - 37 U/L   ALT 36  0 - 53 U/L   Alkaline Phosphatase 81  39 - 117 U/L   Total Bilirubin 0.5  0.3 - 1.2 mg/dL   GFR calc non Af Amer 25 (*) >90 mL/min   GFR calc Af Amer 30 (*) >90 mL/min   Comment: (NOTE)     The eGFR has been calculated using the CKD EPI equation.     This calculation has not been validated in all clinical situations.     eGFR's persistently <90 mL/min signify possible Chronic Kidney     Disease.   Anion gap 15  5 - 15  ETHANOL  Status: None   Collection Time    06/16/14  2:00 PM       Result Value Ref Range   Alcohol, Ethyl (B) <11  0 - 11 mg/dL   Comment:            LOWEST DETECTABLE LIMIT FOR     SERUM ALCOHOL IS 11 mg/dL     FOR MEDICAL PURPOSES ONLY  ACETAMINOPHEN LEVEL     Status: Abnormal   Collection Time    06/16/14  2:00 PM      Result Value Ref Range   Acetaminophen (Tylenol), Serum 51.4 (*) 10 - 30 ug/mL   Comment:            THERAPEUTIC CONCENTRATIONS VARY     SIGNIFICANTLY. A RANGE OF 10-30     ug/mL MAY BE AN EFFECTIVE     CONCENTRATION FOR MANY PATIENTS.     HOWEVER, SOME ARE BEST TREATED     AT CONCENTRATIONS OUTSIDE THIS     RANGE.     ACETAMINOPHEN CONCENTRATIONS     >150 ug/mL AT 4 HOURS AFTER     INGESTION AND >50 ug/mL AT 12     HOURS AFTER INGESTION ARE     OFTEN ASSOCIATED WITH TOXIC     REACTIONS.  SALICYLATE LEVEL     Status: Abnormal   Collection Time    06/16/14  2:00 PM      Result Value Ref Range   Salicylate Lvl <8.2 (*) 2.8 - 20.0 mg/dL   Labs are reviewed and are pertinent for anemia, elevated creatinine, elevated acetaminophen serum level.  Current Facility-Administered Medications  Medication Dose Route Frequency Provider Last Rate Last Dose  . sodium chloride 0.9 % bolus 500 mL  500 mL Intravenous Once Pamella Pert, MD       Current Outpatient Prescriptions  Medication Sig Dispense Refill  . acetaminophen (TYLENOL) 325 MG tablet Take 2 tablets (650 mg total) by mouth every 6 (six) hours as needed for mild pain (or Fever >/= 101).  30 tablet  0  . amLODipine (NORVASC) 2.5 MG tablet Take 2.5 mg by mouth daily.      Marland Kitchen aspirin 81 MG chewable tablet Chew 81 mg by mouth every morning.       . clopidogrel (PLAVIX) 75 MG tablet Take 1 tablet (75 mg total) by mouth daily.  30 tablet  11  . furosemide (LASIX) 20 MG tablet Take 20 mg by mouth daily. At noon      . furosemide (LASIX) 40 MG tablet Take 40 mg by mouth every morning.      . hydrochlorothiazide (HYDRODIURIL) 25 MG tablet Take 25 mg by mouth daily.       Marland Kitchen  ketoconazole (NIZORAL) 2 % cream Apply 1 application topically daily as needed for irritation.       . metoprolol tartrate (LOPRESSOR) 25 MG tablet Take 1/2 tablet daily  30 tablet  6  . Multiple Vitamin (MULTIVITAMIN WITH MINERALS) TABS Take 1 tablet by mouth every morning.       . predniSONE (DELTASONE) 5 MG tablet TAKE 1 TABLET BY MOUTH THREE TIMES DAILY  270 tablet  1  . pyridostigmine (MESTINON) 60 MG tablet Take 1 tablet (60 mg total) by mouth 3 (three) times daily.  90 tablet  5  . simvastatin (ZOCOR) 5 MG tablet Take 5 mg by mouth at bedtime.      . Tamsulosin HCl (FLOMAX) 0.4 MG CAPS Take 0.4 mg by mouth  at bedtime.      . Vitamin D, Ergocalciferol, (DRISDOL) 50000 UNITS CAPS Take 50,000 Units by mouth every 7 (seven) days. On Sundays        Psychiatric Specialty Exam:     Blood pressure 163/66, pulse 59, temperature 97.8 F (36.6 C), resp. rate 16, SpO2 100.00%.There is no weight on file to calculate BMI.  General Appearance: Fairly Groomed  Engineer, water::  Good  Speech:  Normal Rate  Volume:  Decreased  Mood:  depressed   Affect:  Appropriate and Congruent  Thought Process:  Goal Directed and Linear  Orientation:  Full (Time, Place, and Person)-  Recall is 3/3 immediate and 2/3 at 3 minutes, W-O-R-L-D / D-L-R-O-W without difficulties , able to  Perform serial substractions and name common objects without any difficulty  Thought Content:  no hallucinations, no delusions  Suicidal Thoughts:  Yes.  with intent/plan- patient is candid in stating he feels he does not want to live as his physical health  And ability to function continues to deteriorate  Homicidal Thoughts:  No  Memory:  recent and remote grossly intact  Judgement:  Fair  Insight:  Good  Psychomotor Activity:  Decreased  Concentration:  Good  Recall:  Good  Fund of Knowledge:Good  Language: Good  Akathisia:  Negative  Handed:  Right  AIMS (if indicated):     Assets:  Communication Skills Resilience  Sleep:       Musculoskeletal: Strength & Muscle Tone: within normal limits Gait & Station: not examined - patient states he ambulates with walker Patient leans: N/A  Treatment Plan Summary: RECOMMENDATIONS 1. Patient  Is in need of inpatient psychiatric admission due to ongoing suicidal ideations/inability to contract for safety. ( Once he is medically cleared following his Acetaminophen overdose)  2. Recommend 1-1 / sitter observation 3. Patient not currently interested in starting an antidepressant - if he agrees would consider Zoloft, starting at 25 mgrs QDAY. Parke Poisson, Felicita Gage 06/16/2014 4:37 PM

## 2014-06-16 NOTE — ED Notes (Signed)
Patient still unable to urinate at this time

## 2014-06-16 NOTE — ED Notes (Signed)
Gave patient a sandwich with a piece of cheese and a sprite.

## 2014-06-16 NOTE — ED Notes (Signed)
Pt voided in depend. Pt cleaned up at this time and given an urinal.

## 2014-06-16 NOTE — ED Notes (Signed)
Pt BIB EMS, reports SI, OD on 10 Extra Strength Tylenol last night and another 10 this morning. Pt also stated he wanted to cut himself, "but I couldn't get the nerve." Pt a&ox4, FSBG 204. Pt skin warm and dry. Pt placed in paper scrubs, [employer] at bedside.

## 2014-06-16 NOTE — ED Notes (Signed)
Bed: WA21 Expected date:  Expected time:  Means of arrival:  Comments: EMS-OD tylenol-80 y/o

## 2014-06-17 ENCOUNTER — Encounter (HOSPITAL_COMMUNITY): Payer: Self-pay | Admitting: Psychiatry

## 2014-06-17 DIAGNOSIS — F4325 Adjustment disorder with mixed disturbance of emotions and conduct: Secondary | ICD-10-CM | POA: Diagnosis present

## 2014-06-17 DIAGNOSIS — T391X2A Poisoning by 4-Aminophenol derivatives, intentional self-harm, initial encounter: Secondary | ICD-10-CM | POA: Diagnosis present

## 2014-06-17 LAB — CBC
HCT: 29.5 % — ABNORMAL LOW (ref 39.0–52.0)
Hemoglobin: 9.7 g/dL — ABNORMAL LOW (ref 13.0–17.0)
MCH: 30.6 pg (ref 26.0–34.0)
MCHC: 32.9 g/dL (ref 30.0–36.0)
MCV: 93.1 fL (ref 78.0–100.0)
PLATELETS: 217 10*3/uL (ref 150–400)
RBC: 3.17 MIL/uL — ABNORMAL LOW (ref 4.22–5.81)
RDW: 15.7 % — ABNORMAL HIGH (ref 11.5–15.5)
WBC: 8.5 10*3/uL (ref 4.0–10.5)

## 2014-06-17 LAB — ACETAMINOPHEN LEVEL: Acetaminophen (Tylenol), Serum: <15 ug/mL (ref 10–30)

## 2014-06-17 NOTE — ED Notes (Signed)
Pt still eating lunch at this time. Will transport pt to family car when he has finished eating

## 2014-06-17 NOTE — Consult Note (Signed)
[facility] Consult   Reason for Consult:  Suicide attempt Referring Physician:  ER Physician ZENO HICKEL is an 80 y.o. male. Total Time spent with patient: 30 minutes  Assessment: AXIS I:  Adjustment disorder with disturbance of emotions and conduct. AXIS II:  Deferred AXIS III:   Past Medical History  Diagnosis Date  . Hyperlipidemia   . Hypertension   . Dizziness   . Blood clot in vein   . Arthritis   . Peripheral arterial disease   . Carotid artery occlusion   . Ocular myasthenia gravis   . BPH (benign prostatic hyperplasia)   . Nephrolithiasis   . Low back pain   . Gait disorder   . Hx of radiation therapy 07/06/12, 07/08/12, 07/13/12    left upper lung nodule, 3 fractions  . Hearing difficulty   . Lumbago   . Abnormality of gait 05/09/2013  . Hearing deficit     right hearing aid  . Peripheral edema   . Lung cancer 05/2012    left   AXIS IV: worsening physical health/eye sight, concerned about possible need for a Nursing Home  AXIS V:  70; mild symptoms  Plan:  Recommend assisted living and family to stay until transfer is made.  Dr. Adele Schilder assessed the patient and concurs with the plan.  Subjective:   QUANTAVIOUS EGGERT is a 80 y.o. male patient does not warrant admission  HPI:  Patient is clear and coherent today with his daughter at his bedside.  Denies suicidal/homicidal ideations, hallucinations.  He states he would not try to hurt himself again because it "caused to much trouble."  His big issue is frustration of not able to care for himself.  He has now accepted that he needs to move from independent living to assisted living and his daughter, who wants him to be discharged, will stay with him or have someone stay with him until he is transferred to assisted living.   Past Psychiatric History: Past Medical History  Diagnosis Date  . Hyperlipidemia   . Hypertension   . Dizziness   . Blood clot in vein   . Arthritis   .  Peripheral arterial disease   . Carotid artery occlusion   . Ocular myasthenia gravis   . BPH (benign prostatic hyperplasia)   . Nephrolithiasis   . Low back pain   . Gait disorder   . Hx of radiation therapy 07/06/12, 07/08/12, 07/13/12    left upper lung nodule, 3 fractions  . Hearing difficulty   . Lumbago   . Abnormality of gait 05/09/2013  . Hearing deficit     right hearing aid  . Peripheral edema   . Lung cancer 05/2012    left    reports that he quit smoking about 57 years ago. His smoking use included Cigarettes. He has a 60 pack-year smoking history. He has never used smokeless tobacco. He reports that he does not drink alcohol or use illicit drugs. Family History  Problem Relation Age of Onset  . Bone cancer Mother   . Pneumonia Father   . Stroke Sister   . Hypertension Sister   . Heart disease Sister            Allergies:   Allergies  Allergen Reactions  . Hydrocodone     unknown    Objective: Blood pressure 144/57, pulse 89, temperature 98.3 F (36.8 C), temperature source Oral, resp. rate 20, SpO2 96.00%.There is no weight on file to  calculate BMI. Results for orders placed during the hospital encounter of 06/16/14 (from the past 72 hour(s))  CBC     Status: Abnormal   Collection Time    06/16/14  2:00 PM      Result Value Ref Range   WBC 8.1  4.0 - 10.5 K/uL   RBC 2.96 (*) 4.22 - 5.81 MIL/uL   Hemoglobin 8.9 (*) 13.0 - 17.0 g/dL   HCT 27.8 (*) 39.0 - 52.0 %   MCV 93.9  78.0 - 100.0 fL   MCH 30.1  26.0 - 34.0 pg   MCHC 32.0  30.0 - 36.0 g/dL   RDW 15.7 (*) 11.5 - 15.5 %   Platelets 220  150 - 400 K/uL  COMPREHENSIVE METABOLIC PANEL     Status: Abnormal   Collection Time    06/16/14  2:00 PM      Result Value Ref Range   Sodium 140  137 - 147 mEq/L   Potassium 4.1  3.7 - 5.3 mEq/L   Chloride 95 (*) 96 - 112 mEq/L   CO2 30  19 - 32 mEq/L   Glucose, Bld 118 (*) 70 - 99 mg/dL   BUN 44 (*) 6 - 23 mg/dL   Creatinine, Ser 2.04 (*) 0.50 - 1.35  mg/dL   Calcium 8.9  8.4 - 10.5 mg/dL   Total Protein 5.9 (*) 6.0 - 8.3 g/dL   Albumin 3.2 (*) 3.5 - 5.2 g/dL   AST 30  0 - 37 U/L   ALT 36  0 - 53 U/L   Alkaline Phosphatase 81  39 - 117 U/L   Total Bilirubin 0.5  0.3 - 1.2 mg/dL   GFR calc non Af Amer 25 (*) >90 mL/min   GFR calc Af Amer 30 (*) >90 mL/min   Comment: (NOTE)     The eGFR has been calculated using the CKD EPI equation.     This calculation has not been validated in all clinical situations.     eGFR's persistently <90 mL/min signify possible Chronic Kidney     Disease.   Anion gap 15  5 - 15  ETHANOL     Status: None   Collection Time    06/16/14  2:00 PM      Result Value Ref Range   Alcohol, Ethyl (B) <11  0 - 11 mg/dL   Comment:            LOWEST DETECTABLE LIMIT FOR     SERUM ALCOHOL IS 11 mg/dL     FOR MEDICAL PURPOSES ONLY  ACETAMINOPHEN LEVEL     Status: Abnormal   Collection Time    06/16/14  2:00 PM      Result Value Ref Range   Acetaminophen (Tylenol), Serum 51.4 (*) 10 - 30 ug/mL   Comment:            THERAPEUTIC CONCENTRATIONS VARY     SIGNIFICANTLY. A RANGE OF 10-30     ug/mL MAY BE AN EFFECTIVE     CONCENTRATION FOR MANY PATIENTS.     HOWEVER, SOME ARE BEST TREATED     AT CONCENTRATIONS OUTSIDE THIS     RANGE.     ACETAMINOPHEN CONCENTRATIONS     >150 ug/mL AT 4 HOURS AFTER     INGESTION AND >50 ug/mL AT 12     HOURS AFTER INGESTION ARE     OFTEN ASSOCIATED WITH TOXIC     REACTIONS.  SALICYLATE LEVEL  Status: Abnormal   Collection Time    06/16/14  2:00 PM      Result Value Ref Range   Salicylate Lvl <7.4 (*) 2.8 - 20.0 mg/dL  ACETAMINOPHEN LEVEL     Status: None   Collection Time    06/17/14  9:53 AM      Result Value Ref Range   Acetaminophen (Tylenol), Serum <15.0  10 - 30 ug/mL   Comment:            THERAPEUTIC CONCENTRATIONS VARY     SIGNIFICANTLY. A RANGE OF 10-30     ug/mL MAY BE AN EFFECTIVE     CONCENTRATION FOR MANY PATIENTS.     HOWEVER, SOME ARE BEST TREATED      AT CONCENTRATIONS OUTSIDE THIS     RANGE.     ACETAMINOPHEN CONCENTRATIONS     >150 ug/mL AT 4 HOURS AFTER     INGESTION AND >50 ug/mL AT 12     HOURS AFTER INGESTION ARE     OFTEN ASSOCIATED WITH TOXIC     REACTIONS.  CBC     Status: Abnormal   Collection Time    06/17/14  9:53 AM      Result Value Ref Range   WBC 8.5  4.0 - 10.5 K/uL   RBC 3.17 (*) 4.22 - 5.81 MIL/uL   Hemoglobin 9.7 (*) 13.0 - 17.0 g/dL   HCT 29.5 (*) 39.0 - 52.0 %   MCV 93.1  78.0 - 100.0 fL   MCH 30.6  26.0 - 34.0 pg   MCHC 32.9  30.0 - 36.0 g/dL   RDW 15.7 (*) 11.5 - 15.5 %   Platelets 217  150 - 400 K/uL   Labs are reviewed and are pertinent for anemia, elevated creatinine, elevated acetaminophen serum level.  No current facility-administered medications for this encounter.   Current Outpatient Prescriptions  Medication Sig Dispense Refill  . acetaminophen (TYLENOL) 325 MG tablet Take 2 tablets (650 mg total) by mouth every 6 (six) hours as needed for mild pain (or Fever >/= 101).  30 tablet  0  . amLODipine (NORVASC) 2.5 MG tablet Take 2.5 mg by mouth daily.      Marland Kitchen aspirin 81 MG chewable tablet Chew 81 mg by mouth every morning.       . clopidogrel (PLAVIX) 75 MG tablet Take 1 tablet (75 mg total) by mouth daily.  30 tablet  11  . furosemide (LASIX) 20 MG tablet Take 20 mg by mouth daily. At noon      . furosemide (LASIX) 40 MG tablet Take 40 mg by mouth every morning.      . hydrochlorothiazide (HYDRODIURIL) 25 MG tablet Take 25 mg by mouth daily.       Marland Kitchen ketoconazole (NIZORAL) 2 % cream Apply 1 application topically daily as needed for irritation.       . metoprolol tartrate (LOPRESSOR) 25 MG tablet Take 1/2 tablet daily  30 tablet  6  . Multiple Vitamin (MULTIVITAMIN WITH MINERALS) TABS Take 1 tablet by mouth every morning.       . predniSONE (DELTASONE) 5 MG tablet TAKE 1 TABLET BY MOUTH THREE TIMES DAILY  270 tablet  1  . pyridostigmine (MESTINON) 60 MG tablet Take 1 tablet (60 mg total) by mouth 3  (three) times daily.  90 tablet  5  . simvastatin (ZOCOR) 5 MG tablet Take 5 mg by mouth at bedtime.      . Tamsulosin HCl (FLOMAX) 0.4  MG CAPS Take 0.4 mg by mouth at bedtime.      . Vitamin D, Ergocalciferol, (DRISDOL) 50000 UNITS CAPS Take 50,000 Units by mouth every 7 (seven) days. On Sundays        Psychiatric Specialty Exam:     Blood pressure 144/57, pulse 89, temperature 98.3 F (36.8 C), temperature source Oral, resp. rate 20, SpO2 96.00%.There is no weight on file to calculate BMI.  General Appearance: Casual  Eye Contact::  Good  Speech:  Normal Rate  Volume:  Normal  Mood:  Euthymic  Affect:  Congruent  Thought Process:  Coherent  Orientation:  Full (Time, Place, and Person)  Thought Content:  WDL  Suicidal Thoughts:  No  Homicidal Thoughts:  No  Memory:  Immediate;   Good Recent;   Good Remote;   Good  Judgement:  Fair  Insight:  Fair  Psychomotor Activity:  Normal  Concentration:  Good  Recall:  Good  Fund of Knowledge:Good  Language: Good  Akathisia:  No  Handed:  Right  AIMS (if indicated):     Assets:  Communication Skills Desire for Improvement Financial Resources/Insurance Housing Leisure Time Resilience Social Support  Sleep:       Musculoskeletal: Strength & Muscle Tone: within normal limits Gait & Station: normal Patient leans: N/A  Treatment Plan Summary: RECOMMENDATIONS 1.  Patient will discharge with follow-up with his regular PCP 2.  Family will stay with him until he can be transferred from independent living to assisted living. Marland Kitchen   Waylan Boga, [place] 06/17/2014 12:27 PM

## 2014-06-17 NOTE — ED Notes (Signed)
When questioning pt about wanting to harm himself, pt sts "not today." Pt is A&O, calm and cooperative and in NAD

## 2014-06-17 NOTE — ED Notes (Signed)
Pt MAR was delivered by family. MAR was given to pharmacy tech to update in the system

## 2014-06-17 NOTE — [facility] Suicide Risk Assessment (Signed)
Suicide Risk Assessment  Discharge Assessment     Demographic Factors:  Male, Age 80 or older, Divorced or widowed and Caucasian  Total Time spent with patient: 45 minutes  Psychiatric Specialty Exam:     Blood pressure 144/57, pulse 89, temperature 98.3 F (36.8 C), temperature source Oral, resp. rate 20, SpO2 96.00%.There is no weight on file to calculate BMI.  General Appearance: Casual  Eye Contact::  Good  Speech:  Normal Rate  Volume:  Normal  Mood:  Euthymic  Affect:  Congruent  Thought Process:  Coherent  Orientation:  Full (Time, Place, and Person)  Thought Content:  WDL  Suicidal Thoughts:  No  Homicidal Thoughts:  No  Memory:  Immediate;   Good Recent;   Good Remote;   Good  Judgement:  Fair  Insight:  Fair  Psychomotor Activity:  Normal  Concentration:  Good  Recall:  Good  Fund of Knowledge:Good  Language: Good  Akathisia:  No  Handed:  Right  AIMS (if indicated):     Assets:  Communication Skills Desire for Improvement Financial Resources/Insurance Housing Leisure Time Resilience Social Support  Sleep:       Musculoskeletal: Strength & Muscle Tone: within normal limits Gait & Station: normal Patient leans: N/A   Mental Status Per Nursing Assessment::   On Admission:   overdose  Current Mental Status by Physician: NA  Loss Factors: NA  Historical Factors: NA  Risk Reduction Factors:   Sense of responsibility to family, Living with another person, especially a relative and Positive social support  Continued Clinical Symptoms:  None  Cognitive Features That Contribute To Risk:  None  Suicide Risk:  Minimal: No identifiable suicidal ideation.  Patients presenting with no risk factors but with morbid ruminations; may be classified as minimal risk based on the severity of the depressive symptoms  Discharge Diagnoses:   AXIS I:  Adjustment Disorder with Mixed Disturbance of Emotions and Conduct AXIS II:  Deferred AXIS III:    Past Medical History  Diagnosis Date  . Hyperlipidemia   . Hypertension   . Dizziness   . Blood clot in vein   . Arthritis   . Peripheral arterial disease   . Carotid artery occlusion   . Ocular myasthenia gravis   . BPH (benign prostatic hyperplasia)   . Nephrolithiasis   . Low back pain   . Gait disorder   . Hx of radiation therapy 07/06/12, 07/08/12, 07/13/12    left upper lung nodule, 3 fractions  . Hearing difficulty   . Lumbago   . Abnormality of gait 05/09/2013  . Hearing deficit     right hearing aid  . Peripheral edema   . Lung cancer 05/2012    left   AXIS IV:  chronic medical issues AXIS V:  61-70 mild symptoms  Plan Of Care/Follow-up recommendations:  Activity:  as tolerated Diet:  heart healthy  Is patient on multiple antipsychotic therapies at discharge:  No   Has Patient had three or more failed trials of antipsychotic monotherapy by history:  No  Recommended Plan for Multiple Antipsychotic Therapies: NA    Harvest Stanco, [place], PMH-NP 06/17/2014, 12:20 PM

## 2014-06-17 NOTE — ED Notes (Signed)
Daughter was called about pt d/c home. Daughter confirmed that she will transport pt back to facility and does not want [facility] to transport

## 2014-06-17 NOTE — ED Notes (Signed)
Pt c/o mild back pain d/t stretcher. Acquired floor bed for pt. Pt bed changed for comfort. Sitter at bedside

## 2014-06-17 NOTE — Discharge Instructions (Signed)
Depression Depression is feeling sad, low, down in the dumps, blue, gloomy, or empty. In general, there are two kinds of depression:  Normal sadness or grief. This can happen after something upsetting. It often goes away on its own within 2 weeks. After losing a loved one (bereavement), normal sadness and grief may last longer than two weeks. It usually gets better with time.  Clinical depression. This kind lasts longer than normal sadness or grief. It keeps you from doing the things you normally do in life. It is often hard to function at home, work, or at school. It may affect your relationships with others. Treatment is often needed. GET HELP RIGHT AWAY IF:  You have thoughts about hurting yourself or others.  You lose touch with reality (psychotic symptoms). You may:  See or hear things that are not real.  Have untrue beliefs about your life or people around you.  Your medicine is giving you problems. MAKE SURE YOU:  Understand these instructions.  Will watch your condition.  Will get help right away if you are not doing well or get worse. Document Released: 09/06/2010 Document Revised: 12/19/2013 Document Reviewed: 12/04/2011 [employer] Patient Information 2015 [employer]. This information is not intended to replace advice given to you by your health care provider. Make sure you discuss any questions you have with your health care provider.  

## 2014-06-17 NOTE — ED Notes (Signed)
Pt belongings returned to pt and family member. Tech and family member dressing pt to return home

## 2014-06-19 ENCOUNTER — Telehealth: Payer: Self-pay | Admitting: Cardiovascular Disease

## 2014-06-19 NOTE — Telephone Encounter (Signed)
Yes, at 80 y/o he can stop hid plavix due to excessive bruissing

## 2014-06-19 NOTE — Telephone Encounter (Signed)
Pt's daughter, [name] advised Ok for pt to stop Plavix

## 2014-06-19 NOTE — Telephone Encounter (Signed)
He was suppose to have a procedure,but he decided not to have it. Her question is can he stop the Plavix? Pt havebruises all over his body,he also seems to be confused.

## 2014-06-19 NOTE — Telephone Encounter (Signed)
Pt gave me verbal permission to speak with his daughter [name].  Spoke with patient's daughter,Laura. She states Dr Gwenlyn Found was aware of pt's bruising at time of office visit with him 05/17/14, but it seems to be worse since that office visit. She states no signs of active bleeding. Pt is also taking aspirin 81mg  daily.  She is trying to reduce the number of pills pt takes on a daily basis. Since patient is not going to have carotid procedure, [name] is asking if pt can discontinue plavix.  [name] is aware that I am forwarding this to Dr Gwenlyn Found for review and recommendations.

## 2014-06-20 ENCOUNTER — Other Ambulatory Visit: Payer: Self-pay

## 2014-06-20 MED ORDER — PYRIDOSTIGMINE BROMIDE 60 MG PO TABS: 60.0000 mg | ORAL_TABLET | Freq: Three times a day (TID) | ORAL | Status: AC

## 2014-06-22 ENCOUNTER — Other Ambulatory Visit: Payer: Self-pay | Admitting: Neurology

## 2014-06-23 ENCOUNTER — Ambulatory Visit: Payer: Medicare Other | Admitting: Podiatry

## 2014-06-30 ENCOUNTER — Encounter: Payer: Self-pay | Admitting: Podiatry

## 2014-06-30 ENCOUNTER — Ambulatory Visit (INDEPENDENT_AMBULATORY_CARE_PROVIDER_SITE_OTHER): Payer: Medicare Other | Admitting: Podiatry

## 2014-06-30 VITALS — BP 136/63 | HR 63

## 2014-06-30 DIAGNOSIS — M79606 Pain in leg, unspecified: Secondary | ICD-10-CM

## 2014-06-30 DIAGNOSIS — B351 Tinea unguium: Secondary | ICD-10-CM

## 2014-06-30 NOTE — Progress Notes (Signed)
Subjective:  80 year old male presents accompanied by his son using a walker requesting toe nails trimmed. He was using anti fungal medication for his toe nails until he ran out of cream.  Wearing compression socks.   Objective: Thick hypertrophic nails x 10.  Discolored both great toe nails.  Pedal pulses are not palpable.  No open lesions or acute changes.  Assessment: PVD.  Onychomycosis x 10.  Pain from thick toe nails.   Plan: Debrided all nails.

## 2014-06-30 NOTE — Patient Instructions (Signed)
Seen for hypertrophic fungal nails. All nails debrided. May benefit from Vinegar soak 2-3 times a week and Vic's vapor rub 3-4 times a week.  Return in 3 months or as needed.

## 2014-07-12 ENCOUNTER — Encounter (HOSPITAL_COMMUNITY): Payer: Self-pay | Admitting: *Deleted

## 2014-07-12 ENCOUNTER — Emergency Department (HOSPITAL_COMMUNITY): Payer: Medicare Other

## 2014-07-12 ENCOUNTER — Observation Stay (HOSPITAL_COMMUNITY)
Admission: EM | Admit: 2014-07-12 | Discharge: 2014-07-13 | Payer: Medicare Other | Attending: Internal Medicine | Admitting: Internal Medicine

## 2014-07-12 ENCOUNTER — Observation Stay (HOSPITAL_COMMUNITY): Payer: Medicare Other

## 2014-07-12 DIAGNOSIS — M25512 Pain in left shoulder: Secondary | ICD-10-CM | POA: Insufficient documentation

## 2014-07-12 DIAGNOSIS — M545 Low back pain: Secondary | ICD-10-CM | POA: Diagnosis not present

## 2014-07-12 DIAGNOSIS — Y9389 Activity, other specified: Secondary | ICD-10-CM | POA: Diagnosis not present

## 2014-07-12 DIAGNOSIS — M160 Bilateral primary osteoarthritis of hip: Secondary | ICD-10-CM | POA: Insufficient documentation

## 2014-07-12 DIAGNOSIS — R531 Weakness: Secondary | ICD-10-CM | POA: Diagnosis not present

## 2014-07-12 DIAGNOSIS — Z7982 Long term (current) use of aspirin: Secondary | ICD-10-CM | POA: Insufficient documentation

## 2014-07-12 DIAGNOSIS — E785 Hyperlipidemia, unspecified: Secondary | ICD-10-CM | POA: Diagnosis not present

## 2014-07-12 DIAGNOSIS — Y9289 Other specified places as the place of occurrence of the external cause: Secondary | ICD-10-CM | POA: Diagnosis not present

## 2014-07-12 DIAGNOSIS — W19XXXA Unspecified fall, initial encounter: Secondary | ICD-10-CM

## 2014-07-12 DIAGNOSIS — G7000 Myasthenia gravis without (acute) exacerbation: Principal | ICD-10-CM | POA: Insufficient documentation

## 2014-07-12 DIAGNOSIS — I129 Hypertensive chronic kidney disease with stage 1 through stage 4 chronic kidney disease, or unspecified chronic kidney disease: Secondary | ICD-10-CM | POA: Diagnosis not present

## 2014-07-12 DIAGNOSIS — Y998 Other external cause status: Secondary | ICD-10-CM | POA: Insufficient documentation

## 2014-07-12 DIAGNOSIS — N183 Chronic kidney disease, stage 3 unspecified: Secondary | ICD-10-CM | POA: Diagnosis present

## 2014-07-12 DIAGNOSIS — M25552 Pain in left hip: Secondary | ICD-10-CM | POA: Diagnosis present

## 2014-07-12 DIAGNOSIS — I6523 Occlusion and stenosis of bilateral carotid arteries: Secondary | ICD-10-CM | POA: Diagnosis not present

## 2014-07-12 DIAGNOSIS — I6529 Occlusion and stenosis of unspecified carotid artery: Secondary | ICD-10-CM | POA: Insufficient documentation

## 2014-07-12 DIAGNOSIS — I739 Peripheral vascular disease, unspecified: Secondary | ICD-10-CM | POA: Insufficient documentation

## 2014-07-12 DIAGNOSIS — D631 Anemia in chronic kidney disease: Secondary | ICD-10-CM | POA: Insufficient documentation

## 2014-07-12 DIAGNOSIS — N400 Enlarged prostate without lower urinary tract symptoms: Secondary | ICD-10-CM | POA: Diagnosis not present

## 2014-07-12 DIAGNOSIS — W010XXA Fall on same level from slipping, tripping and stumbling without subsequent striking against object, initial encounter: Secondary | ICD-10-CM | POA: Insufficient documentation

## 2014-07-12 DIAGNOSIS — Z923 Personal history of irradiation: Secondary | ICD-10-CM | POA: Diagnosis not present

## 2014-07-12 DIAGNOSIS — Z85118 Personal history of other malignant neoplasm of bronchus and lung: Secondary | ICD-10-CM | POA: Insufficient documentation

## 2014-07-12 DIAGNOSIS — I700 Atherosclerosis of aorta: Secondary | ICD-10-CM | POA: Diagnosis not present

## 2014-07-12 DIAGNOSIS — Z885 Allergy status to narcotic agent status: Secondary | ICD-10-CM | POA: Insufficient documentation

## 2014-07-12 DIAGNOSIS — R262 Difficulty in walking, not elsewhere classified: Secondary | ICD-10-CM

## 2014-07-12 LAB — COMPREHENSIVE METABOLIC PANEL
ALK PHOS: 123 U/L — AB (ref 39–117)
ALT: 59 U/L — ABNORMAL HIGH (ref 0–53)
ANION GAP: 15 (ref 5–15)
AST: 33 U/L (ref 0–37)
Albumin: 2.9 g/dL — ABNORMAL LOW (ref 3.5–5.2)
BUN: 39 mg/dL — AB (ref 6–23)
CO2: 28 mEq/L (ref 19–32)
Calcium: 9.2 mg/dL (ref 8.4–10.5)
Chloride: 97 mEq/L (ref 96–112)
Creatinine, Ser: 1.85 mg/dL — ABNORMAL HIGH (ref 0.50–1.35)
GFR calc Af Amer: 33 mL/min — ABNORMAL LOW (ref >90)
GFR calc non Af Amer: 29 mL/min — ABNORMAL LOW (ref >90)
Glucose, Bld: 95 mg/dL (ref 70–99)
Potassium: 3.6 mEq/L — ABNORMAL LOW (ref 3.7–5.3)
Sodium: 140 mEq/L (ref 137–147)
Total Bilirubin: 0.5 mg/dL (ref 0.3–1.2)
Total Protein: 6.1 g/dL (ref 6.0–8.3)

## 2014-07-12 LAB — URINALYSIS, ROUTINE W REFLEX MICROSCOPIC
BILIRUBIN URINE: NEGATIVE
Glucose, UA: NEGATIVE mg/dL
Hgb urine dipstick: NEGATIVE
Ketones, ur: NEGATIVE mg/dL
Leukocytes, UA: NEGATIVE
NITRITE: NEGATIVE
PH: 7 (ref 5.0–8.0)
Protein, ur: NEGATIVE mg/dL
Specific Gravity, Urine: 1.017 (ref 1.005–1.030)
Urobilinogen, UA: 0.2 mg/dL (ref 0.0–1.0)

## 2014-07-12 LAB — CBC WITH DIFFERENTIAL/PLATELET
BASOS ABS: 0 10*3/uL (ref 0.0–0.1)
Basophils Relative: 0 % (ref 0–1)
Eosinophils Absolute: 0.2 10*3/uL (ref 0.0–0.7)
Eosinophils Relative: 1 % (ref 0–5)
HEMATOCRIT: 28.3 % — AB (ref 39.0–52.0)
HEMOGLOBIN: 9.2 g/dL — AB (ref 13.0–17.0)
LYMPHS PCT: 12 % (ref 12–46)
Lymphs Abs: 1.7 10*3/uL (ref 0.7–4.0)
MCH: 30.2 pg (ref 26.0–34.0)
MCHC: 32.5 g/dL (ref 30.0–36.0)
MCV: 92.8 fL (ref 78.0–100.0)
MONOS PCT: 6 % (ref 3–12)
Monocytes Absolute: 0.7 10*3/uL (ref 0.1–1.0)
NEUTROS ABS: 10.8 10*3/uL — AB (ref 1.7–7.7)
Neutrophils Relative %: 81 % — ABNORMAL HIGH (ref 43–77)
Platelets: 299 10*3/uL (ref 150–400)
RBC: 3.05 MIL/uL — AB (ref 4.22–5.81)
RDW: 15.5 % (ref 11.5–15.5)
WBC: 13.4 10*3/uL — AB (ref 4.0–10.5)

## 2014-07-12 LAB — I-STAT TROPONIN, ED: Troponin i, poc: 0.09 ng/mL (ref 0.00–0.08)

## 2014-07-12 LAB — TROPONIN I

## 2014-07-12 MED ORDER — AMLODIPINE BESYLATE 2.5 MG PO TABS
2.5000 mg | ORAL_TABLET | Freq: Every day | ORAL | Status: DC
  Administered 2014-07-12 – 2014-07-13 (×2): 2.5 mg via ORAL
  Filled 2014-07-12 (×2): qty 1

## 2014-07-12 MED ORDER — ACETAMINOPHEN 325 MG PO TABS: 650.0000 mg | ORAL_TABLET | Freq: Three times a day (TID) | ORAL | Status: DC | PRN

## 2014-07-12 MED ORDER — ACETAMINOPHEN 650 MG RE SUPP: 650.0000 mg | Freq: Three times a day (TID) | RECTAL | Status: DC | PRN

## 2014-07-12 MED ORDER — ACETAMINOPHEN 325 MG PO TABS
650.0000 mg | ORAL_TABLET | Freq: Once | ORAL | Status: AC
  Administered 2014-07-12: 650 mg via ORAL
  Filled 2014-07-12: qty 2

## 2014-07-12 MED ORDER — PYRIDOSTIGMINE BROMIDE 60 MG PO TABS
60.0000 mg | ORAL_TABLET | Freq: Three times a day (TID) | ORAL | Status: DC
  Administered 2014-07-12 – 2014-07-13 (×3): 60 mg via ORAL
  Filled 2014-07-12 (×5): qty 1

## 2014-07-12 MED ORDER — SODIUM CHLORIDE 0.9 % IV SOLN
INTRAVENOUS | Status: DC
  Administered 2014-07-12: 15:00:00 via INTRAVENOUS

## 2014-07-12 MED ORDER — SIMVASTATIN 5 MG PO TABS
5.0000 mg | ORAL_TABLET | Freq: Every day | ORAL | Status: DC
  Administered 2014-07-12: 5 mg via ORAL
  Filled 2014-07-12 (×2): qty 1

## 2014-07-12 MED ORDER — PREDNISONE 5 MG PO TABS
5.0000 mg | ORAL_TABLET | Freq: Three times a day (TID) | ORAL | Status: DC
  Administered 2014-07-12 – 2014-07-13 (×3): 5 mg via ORAL
  Filled 2014-07-12 (×5): qty 1

## 2014-07-12 MED ORDER — TAMSULOSIN HCL 0.4 MG PO CAPS
0.4000 mg | ORAL_CAPSULE | Freq: Every day | ORAL | Status: DC
  Administered 2014-07-12: 0.4 mg via ORAL
  Filled 2014-07-12 (×2): qty 1

## 2014-07-12 MED ORDER — SODIUM CHLORIDE 0.9 % IV SOLN
INTRAVENOUS | Status: DC
Start: 2014-07-12 — End: 2014-07-12

## 2014-07-12 MED ORDER — PYRIDOSTIGMINE BROMIDE 60 MG PO TABS
60.0000 mg | ORAL_TABLET | Freq: Once | ORAL | Status: AC
  Administered 2014-07-12: 60 mg via ORAL
  Filled 2014-07-12: qty 1

## 2014-07-12 MED ORDER — ASPIRIN 81 MG PO CHEW
81.0000 mg | CHEWABLE_TABLET | Freq: Every day | ORAL | Status: DC
  Administered 2014-07-12 – 2014-07-13 (×2): 81 mg via ORAL
  Filled 2014-07-12 (×2): qty 1

## 2014-07-12 NOTE — Progress Notes (Signed)
ED CM received a call from staff of pt's personal care services agency who has spoke en with daughter. [facility] staff states that in the last week pt has been attempting to purchase a knife and has become confused with needing maxium assistance. States pt's knives removed from the home "Raided home"  Reminded cm of pt tylenol OD visit.  Cm spoke with EDP, pfeiffer about these voiced concerns, the treatment voiced by the daughter for iv fluids and addressing the elevated wbc. Reports pt is followed by [place] and and personal care services.

## 2014-07-12 NOTE — ED Provider Notes (Signed)
CSN: [id number]     Arrival date & time 07/12/14  1443 History   First MD Initiated Contact with Patient 07/12/14 [phone]     Chief Complaint  Patient presents with  . Fall  . Hip Pain     (Consider location/radiation/quality/duration/timing/severity/associated sxs/prior Treatment) HPI The patient ports that he had gotten out of bed because he could not sleep. He was checking to see if his assistant was still with him. When he went back to try to get in bed he reports that he missed the bed and fell backwards onto his buttock's and between the wall and bed. He reports he did hit his head but he did not get knocked out he reports he has no headache or pain in that area. He reports pain in his left pelvis and hip area. He reports he however has been able to bear weight. The patient doesn't have any other foci of pain or discomfort that he doesn't identify his chronic. The patient however has been having increasing gait weakness over the past several days. As of last week he was going shopping and ambulating with a walker or cane but quite abley. Now this week he has been weak and at risk of falling and having his legs give out. He doesn't identify any specific fever chills, cough or sputum production, chest pain or dyspnea. Past Medical History  Diagnosis Date  . Hyperlipidemia   . Hypertension   . Dizziness   . Blood clot in vein   . Arthritis   . Peripheral arterial disease   . Carotid artery occlusion   . Ocular myasthenia gravis   . BPH (benign prostatic hyperplasia)   . Nephrolithiasis   . Low back pain   . Gait disorder   . Hx of radiation therapy 07/06/12, 07/08/12, 07/13/12    left upper lung nodule, 3 fractions  . Hearing difficulty   . Lumbago   . Abnormality of gait 05/09/2013  . Hearing deficit     right hearing aid  . Peripheral edema   . Lung cancer 05/2012    left   Past Surgical History  Procedure Laterality Date  . Appendectomy    . Cataract extraction     bilateral  . Retinal detachment surgery  02/2011  . Thyroidectomy, partial    . Transthoracic echocardiogram  01/15/2011    EF=>55%, mild conc LVH; mild MR & mild mitral annular calcification; mild TR, RVSP 30-76mmHg; aortic root sclerosis/calcification  . Carotid doppler  04/2011    80-99% stenosis of right prox to mid ICA   Family History  Problem Relation Age of Onset  . Bone cancer Mother   . Pneumonia Father   . Stroke Sister   . Hypertension Sister   . Heart disease Sister    History  Substance Use Topics  . Smoking status: Former Smoker -- 1.50 packs/day for 40 years    Types: Cigarettes    Quit date: 03/27/1957  . Smokeless tobacco: Never Used     Comment: Quit in 1958  . Alcohol Use: No    Review of Systems 10 Systems reviewed and are negative for acute change except as noted in the HPI.    Allergies  Hydrocodone  Home Medications   Prior to Admission medications   Medication Sig Start Date End Date Taking? Authorizing Provider  acetaminophen (TYLENOL) 325 MG tablet Take 2 tablets (650 mg total) by mouth every 6 (six) hours as needed for mild pain (or Fever >/= 101).  08/02/13  Yes Robbie Lis, MD  amLODipine (NORVASC) 2.5 MG tablet Take 2.5 mg by mouth daily.   Yes Historical Provider, MD  aspirin 81 MG chewable tablet Chew 81 mg by mouth every morning.    Yes Historical Provider, MD  furosemide (LASIX) 40 MG tablet Take 40 mg by mouth every morning.   Yes Historical Provider, MD  hydrochlorothiazide (HYDRODIURIL) 25 MG tablet Take 25 mg by mouth daily.    Yes Historical Provider, MD  metoprolol tartrate (LOPRESSOR) 25 MG tablet Take 1/2 tablet daily 04/26/14  Yes Troy Sine, MD  Multiple Vitamin (MULTIVITAMIN WITH MINERALS) TABS Take 1 tablet by mouth every morning.    Yes Historical Provider, MD  predniSONE (DELTASONE) 5 MG tablet TAKE 1 TABLET BY MOUTH THREE TIMES DAILY 06/22/14  Yes Kathrynn Ducking, MD  pyridostigmine (MESTINON) 60 MG tablet Take 1 tablet  (60 mg total) by mouth 3 (three) times daily. 06/20/14  Yes Kathrynn Ducking, MD  simvastatin (ZOCOR) 5 MG tablet Take 5 mg by mouth at bedtime.   Yes Historical Provider, MD  Tamsulosin HCl (FLOMAX) 0.4 MG CAPS Take 0.4 mg by mouth at bedtime.   Yes Historical Provider, MD  Vitamin D, Ergocalciferol, (DRISDOL) 50000 UNITS CAPS Take 50,000 Units by mouth every 7 (seven) days. On Sundays   Yes Historical Provider, MD   BP 129/57 mmHg  Pulse 82  Temp(Src) 98.1 F (36.7 C) (Oral)  Resp 18  Ht 5\' 7"  (1.702 m)  SpO2 93% Physical Exam  Constitutional: He is oriented to person, place, and time. He appears well-developed and well-nourished.  HENT:  Head: Normocephalic and atraumatic.  Eyes: EOM are normal. Pupils are equal, round, and reactive to light.  Neck: Neck supple.  Cardiovascular: Normal rate, regular rhythm, normal heart sounds and intact distal pulses.   Pulmonary/Chest: Effort normal and breath sounds normal.  Abdominal: Soft. Bowel sounds are normal. He exhibits no distension. There is no tenderness.  Musculoskeletal: Normal range of motion. He exhibits tenderness (Some discomfort with range of motion at the left hip. He can however flex and extend.). He exhibits no edema.  No significant tenderness to compression of the pelvic wings.  Neurological: He is alert and oriented to person, place, and time. He has normal strength. Coordination normal. GCS eye subscore is 4. GCS verbal subscore is 5. GCS motor subscore is 6.  Skin: Skin is warm, dry and intact.  Psychiatric: He has a normal mood and affect.    ED Course  Procedures (including critical care time) Labs Review Labs Reviewed  CBC WITH DIFFERENTIAL - Abnormal; Notable for the following:    WBC 13.4 (*)    RBC 3.05 (*)    Hemoglobin 9.2 (*)    HCT 28.3 (*)    Neutrophils Relative % 81 (*)    Neutro Abs 10.8 (*)    All other components within normal limits  COMPREHENSIVE METABOLIC PANEL - Abnormal; Notable for the  following:    Potassium 3.6 (*)    BUN 39 (*)    Creatinine, Ser 1.85 (*)    Albumin 2.9 (*)    ALT 59 (*)    Alkaline Phosphatase 123 (*)    GFR calc non Af Amer 29 (*)    GFR calc Af Amer 33 (*)    All other components within normal limits  CBC - Abnormal; Notable for the following:    RBC 2.92 (*)    Hemoglobin 9.0 (*)  HCT 27.3 (*)    RDW 15.9 (*)    All other components within normal limits  BASIC METABOLIC PANEL - Abnormal; Notable for the following:    Glucose, Bld 129 (*)    BUN 34 (*)    Creatinine, Ser 1.66 (*)    GFR calc non Af Amer 33 (*)    GFR calc Af Amer 38 (*)    All other components within normal limits  I-STAT TROPOININ, ED - Abnormal; Notable for the following:    Troponin i, poc 0.09 (*)    All other components within normal limits  URINALYSIS, ROUTINE W REFLEX MICROSCOPIC  TROPONIN I    Imaging Review Dg Lumbar Spine Complete  07/12/2014   CLINICAL DATA:  Low back pain.  EXAM: LUMBAR SPINE - COMPLETE 4+ VIEW  COMPARISON:  Plain films lumbar spine 04/06/2012.  FINDINGS: Vertebral body height and alignment are maintained. Lower lumbar spondylosis is unchanged. Aortic atherosclerosis is again seen with a small aneurysm measuring 3.3 cm is unchanged  IMPRESSION: No acute finding.  No change in lower lumbar spondylosis.  No change in aortoiliac atherosclerosis and small aneurysm measuring 3.3 cm.   Electronically Signed   By: Inge Rise M.D.   On: 07/12/2014 15:30   Dg Hip Complete Left  07/12/2014   CLINICAL DATA:  Fall with left hip pain.  Initial encounter.  EXAM: LEFT HIP - COMPLETE 2+ VIEW  COMPARISON:  None.  FINDINGS: No acute fracture or dislocation is identified. Mild osteoarthritis noted of both hip joints. The bony pelvis is intact without evidence of visible fracture or diastasis. No bony lesions are seen. Vascular calcifications are identified in the iliac and femoral arteries bilaterally. Soft tissues are unremarkable.  IMPRESSION: No acute  fracture or dislocation. Osteoarthritis of both hip joints.   Electronically Signed   By: Aletta Edouard M.D.   On: 07/12/2014 09:10   Ct Hip Left Wo Contrast  07/12/2014   CLINICAL DATA:  Left hip pain status post fall at nursing home.  EXAM: CT OF THE LEFT HIP WITHOUT CONTRAST  TECHNIQUE: Multidetector CT imaging of the left hip was performed according to the standard protocol. Multiplanar CT image reconstructions were also generated.  COMPARISON:  None.  FINDINGS: There is no acute left hip fracture, dislocation or avascular necrosis. There is no lytic or sclerotic osseous lesion. AP left superior and inferior pubic rami are intact.  There is no soft tissue mass or fluid collection. There is no hematoma. The muscles are normal. There is peripheral vascular atherosclerotic disease.  IMPRESSION: No acute osseous injury of the left hip.   Electronically Signed   By: Kathreen Devoid   On: 07/12/2014 15:39   Dg Chest Port 1 View  07/12/2014   CLINICAL DATA:  Recent fall with right shoulder pain, initial encounter  EXAM: PORTABLE CHEST - 1 VIEW  COMPARISON:  07/30/2013, 05/05/14  FINDINGS: Cardiac shadow is within normal limits. Chronic changes are noted in the left apex consistent with prior radiation change. The changes seen in the right lung base are less well visualized on the current study. No new focal infiltrate is seen. No acute bony abnormality is noted.  IMPRESSION: Changes in the right lower lobe are not well appreciated on this exam. Chronic changes in the left apex are seen.   Electronically Signed   By: Inez Catalina M.D.   On: 07/12/2014 11:13     EKG Interpretation None     12:15 CONSULT: Hospitalist Dr.  Arbutus Ped MDM   Final diagnoses:  Ambulatory dysfunction  Fall, initial encounter  Weakness   At this point there is no identified acute fracture. However with ambulatory testing the patient was very weak and not functioning at his baseline for use of his walker. He requires  significant support and it was at concert risk of falling. This does appear to be a change in his function from less than a week ago. Family members have noted a significant change only within several days duration. At this point time the exact etiology is unclear however the patient is experiencing gait dysfunction as well as declining performance of ADLs.    Charlesetta Shanks, MD 07/13/14 [phone]

## 2014-07-12 NOTE — ED Notes (Signed)
Pt alert and oriented x4. Respirations even and unlabored, bilateral symmetrical rise and fall of chest. Skin warm and dry. In no acute distress. Denies needs.   

## 2014-07-12 NOTE — Consult Note (Addendum)
Reason for Consult:Weakness and frequent falls Referring Physician: Sarajane Jews  CC: Weakness and frequent falls  HPI: Marvin [name] is an 80 y.o. male presenting from an assisted living facility after a fall.  Patient has complaints of left hip pain.  Per report of family has been falling more frequently.  Complains of being weak.  Has no complaints of difficulty swallowing, diplopia or ptosis.  Patient has a history of ocular MG.  Has been maintained on Prednisone and Mestinon.  Patient with last increase in Mestinon in June.   From review of blood work sedimentation rate, B12, TSH are normal.  LFT's slightly elevated.    Past Medical History  Diagnosis Date  . Hyperlipidemia   . Hypertension   . Dizziness   . Blood clot in vein   . Arthritis   . Peripheral arterial disease   . Carotid artery occlusion   . Ocular myasthenia gravis   . BPH (benign prostatic hyperplasia)   . Nephrolithiasis   . Low back pain   . Gait disorder   . Hx of radiation therapy 07/06/12, 07/08/12, 07/13/12    left upper lung nodule, 3 fractions  . Hearing difficulty   . Lumbago   . Abnormality of gait 05/09/2013  . Hearing deficit     right hearing aid  . Peripheral edema   . Lung cancer 05/2012    left    Past Surgical History  Procedure Laterality Date  . Appendectomy    . Cataract extraction      bilateral  . Retinal detachment surgery  02/2011  . Thyroidectomy, partial    . Transthoracic echocardiogram  01/15/2011    EF=>55%, mild conc LVH; mild MR & mild mitral annular calcification; mild TR, RVSP 30-38mmHg; aortic root sclerosis/calcification  . Carotid doppler  04/2011    80-99% stenosis of right prox to mid ICA    Family History  Problem Relation Age of Onset  . Bone cancer Mother   . Pneumonia Father   . Stroke Sister   . Hypertension Sister   . Heart disease Sister     Social History:  reports that he quit smoking about 57 years ago. His smoking use included Cigarettes. He has  a 60 pack-year smoking history. He has never used smokeless tobacco. He reports that he does not drink alcohol or use illicit drugs.  Allergies  Allergen Reactions  . Hydrocodone     unknown    Medications:  I have reviewed the patient's current medications. Prior to Admission:  Prescriptions prior to admission  Medication Sig Dispense Refill Last Dose  . acetaminophen (TYLENOL) 325 MG tablet Take 2 tablets (650 mg total) by mouth every 6 (six) hours as needed for mild pain (or Fever >/= 101). 30 tablet 0 07/11/2014 at 1900  . amLODipine (NORVASC) 2.5 MG tablet Take 2.5 mg by mouth daily.   07/11/2014 at Unknown time  . aspirin 81 MG chewable tablet Chew 81 mg by mouth every morning.    07/11/2014 at Unknown time  . furosemide (LASIX) 20 MG tablet Take 20 mg by mouth daily. At noon   07/11/2014 at Unknown time  . furosemide (LASIX) 40 MG tablet Take 40 mg by mouth every morning.   07/11/2014 at Unknown time  . hydrochlorothiazide (HYDRODIURIL) 25 MG tablet Take 25 mg by mouth daily.    07/11/2014 at Unknown time  . Influenza vac split quadrivalent PF (FLUARIX) 0.5 ML injection Inject 0.5 mLs into the muscle once.  Past Month at Unknown time  . metoprolol tartrate (LOPRESSOR) 25 MG tablet Take 1/2 tablet daily 30 tablet 6 07/11/2014 at 0700  . Multiple Vitamin (MULTIVITAMIN WITH MINERALS) TABS Take 1 tablet by mouth every morning.    07/11/2014 at Unknown time  . predniSONE (DELTASONE) 5 MG tablet TAKE 1 TABLET BY MOUTH THREE TIMES DAILY 270 tablet 1 07/11/2014 at Unknown time  . pyridostigmine (MESTINON) 60 MG tablet Take 1 tablet (60 mg total) by mouth 3 (three) times daily. 90 tablet 5 07/11/2014 at Unknown time  . simvastatin (ZOCOR) 5 MG tablet Take 5 mg by mouth at bedtime.   07/11/2014 at Unknown time  . Tamsulosin HCl (FLOMAX) 0.4 MG CAPS Take 0.4 mg by mouth at bedtime.   07/11/2014 at Unknown time  . Vitamin D, Ergocalciferol, (DRISDOL) 50000 UNITS CAPS Take 50,000 Units by mouth  every 7 (seven) days. On Sundays   07/11/2014 at Unknown time   Scheduled: . amLODipine  2.5 mg Oral Daily  . aspirin  81 mg Oral Daily  . predniSONE  5 mg Oral TID  . pyridostigmine  60 mg Oral TID  . simvastatin  5 mg Oral QHS  . tamsulosin  0.4 mg Oral QHS    ROS: History obtained from the patient  General ROS: fatigue Psychological ROS: negative for - behavioral disorder, hallucinations, memory difficulties, mood swings or suicidal ideation Ophthalmic ROS: poor vision for many years ENT ROS: HOH and wears a hearing aid Allergy and Immunology ROS: negative for - hives or itchy/watery eyes Hematological and Lymphatic ROS: negative for - bleeding problems, bruising or swollen lymph nodes Endocrine ROS: negative for - galactorrhea, hair pattern changes, polydipsia/polyuria or temperature intolerance Respiratory ROS: negative for - cough, hemoptysis, shortness of breath or wheezing Cardiovascular ROS: negative for - chest pain, dyspnea on exertion, edema or irregular heartbeat Gastrointestinal ROS: negative for - abdominal pain, diarrhea, hematemesis, nausea/vomiting or stool incontinence Genito-Urinary ROS: negative for - dysuria, hematuria, incontinence or urinary frequency/urgency Musculoskeletal ROS: left hip pain Neurological ROS: as noted in HPI Dermatological ROS: negative for rash and skin lesion changes  Physical Examination: Blood pressure 119/44, pulse 82, temperature 98.1 F (36.7 C), temperature source Oral, resp. rate 16, height 5\' 7"  (1.702 m), SpO2 97 %.  HEENT-  Normocephalic, no lesions, without obvious abnormality.  Normal external eye and conjunctiva.  Normal TM's bilaterally.  Normal auditory canals and external ears. Normal external nose, mucus membranes and septum.  Normal pharynx. Cardiovascular- S1, S2 normal, pulses palpable throughout   Lungs- chest clear, no wheezing, rales, normal symmetric air entry Abdomen- soft, non-tender; bowel sounds normal; no  masses,  no organomegaly, protuberant Extremities- no edema Lymph-no adenopathy palpable Musculoskeletal-left hip pain with movement and with palpation Skin-warm and dry, no hyperpigmentation, vitiligo, or suspicious lesions  Neurologic Examination Mental Status: Alert, oriented, thought content appropriate.  Speech fluent without evidence of aphasia.  Able to follow 3 step commands without difficulty. Cranial Nerves: II: Discs flat bilaterally; Vision poor (left greater than right), pupils unreactive to light  III,IV, VI: ptosis not present, extra-ocular motions intact bilaterally V,VII: smile symmetric, facial light touch sensation normal bilaterally VIII: hearing normal bilaterally IX,X: gag reflex present XI: bilateral shoulder shrug XII: midline tongue extension Motor: Right : Upper extremity   5/5    Left:     Upper extremity   5/5  Lower extremity   5/5     Lower extremity   5/5 with limitations on testing at the hip  due to pain Tone and bulk:normal tone throughout; no atrophy noted Sensory: Pinprick and light touch intact throughout, bilaterally Deep Tendon Reflexes: 2+ in the upper extremities and absent in the lower extemities Plantars: Right: mute   Left: mute Cerebellar: normal finger-to-nose and normal heel-to-shin testing bilaterally   Laboratory Studies:   Basic Metabolic Panel:  Recent Labs Lab 07/12/14 1005  NA 140  K 3.6*  CL 97  CO2 28  GLUCOSE 95  BUN 39*  CREATININE 1.85*  CALCIUM 9.2    Liver Function Tests:  Recent Labs Lab 07/12/14 1005  AST 33  ALT 59*  ALKPHOS 123*  BILITOT 0.5  PROT 6.1  ALBUMIN 2.9*   No results for input(s): LIPASE, AMYLASE in the last 168 hours. No results for input(s): AMMONIA in the last 168 hours.  CBC:  Recent Labs Lab 07/12/14 1005  WBC 13.4*  NEUTROABS 10.8*  HGB 9.2*  HCT 28.3*  MCV 92.8  PLT 299    Cardiac Enzymes:  Recent Labs Lab 07/12/14 1529  TROPONINI <0.30    BNP: Invalid  input(s): POCBNP  CBG: No results for input(s): GLUCAP in the last 168 hours.  Microbiology: Results for orders placed or performed during the hospital encounter of 07/30/13  MRSA PCR Screening     Status: None   Collection Time: 07/30/13 11:30 PM  Result Value Ref Range Status   MRSA by PCR NEGATIVE NEGATIVE Final    Comment:        The GeneXpert MRSA Assay (FDA approved for NASAL specimens only), is one component of a comprehensive MRSA colonization surveillance program. It is not intended to diagnose MRSA infection nor to guide or monitor treatment for MRSA infections.    Coagulation Studies: No results for input(s): LABPROT, INR in the last 72 hours.  Urinalysis:  Recent Labs Lab 07/12/14 1048  COLORURINE YELLOW  LABSPEC 1.017  PHURINE 7.0  GLUCOSEU NEGATIVE  HGBUR NEGATIVE  BILIRUBINUR NEGATIVE  KETONESUR NEGATIVE  PROTEINUR NEGATIVE  UROBILINOGEN 0.2  NITRITE NEGATIVE  LEUKOCYTESUR NEGATIVE    Lipid Panel:     Component Value Date/Time   CHOL 177 02/13/2014 0859   TRIG 220* 02/13/2014 0859   HDL 56 02/13/2014 0859   CHOLHDL 3.2 02/13/2014 0859   VLDL 44* 02/13/2014 0859   LDLCALC 77 02/13/2014 0859    HgbA1C:  Lab Results  Component Value Date   HGBA1C 6.5* 12/03/2012    Urine Drug Screen:  No results found for: LABOPIA, COCAINSCRNUR, LABBENZ, AMPHETMU, THCU, LABBARB  Alcohol Level: No results for input(s): ETH in the last 168 hours.  Other results: EKG: sinus rhythm at 81 bpm.  Imaging: Dg Lumbar Spine Complete  07/12/2014   CLINICAL DATA:  Low back pain.  EXAM: LUMBAR SPINE - COMPLETE 4+ VIEW  COMPARISON:  Plain films lumbar spine 04/06/2012.  FINDINGS: Vertebral body height and alignment are maintained. Lower lumbar spondylosis is unchanged. Aortic atherosclerosis is again seen with a small aneurysm measuring 3.3 cm is unchanged  IMPRESSION: No acute finding.  No change in lower lumbar spondylosis.  No change in aortoiliac atherosclerosis  and small aneurysm measuring 3.3 cm.   Electronically Signed   By: Inge Rise M.D.   On: 07/12/2014 15:30   Dg Hip Complete Left  07/12/2014   CLINICAL DATA:  Fall with left hip pain.  Initial encounter.  EXAM: LEFT HIP - COMPLETE 2+ VIEW  COMPARISON:  None.  FINDINGS: No acute fracture or dislocation is identified. Mild osteoarthritis noted of  both hip joints. The bony pelvis is intact without evidence of visible fracture or diastasis. No bony lesions are seen. Vascular calcifications are identified in the iliac and femoral arteries bilaterally. Soft tissues are unremarkable.  IMPRESSION: No acute fracture or dislocation. Osteoarthritis of both hip joints.   Electronically Signed   By: Aletta Edouard M.D.   On: 07/12/2014 09:10   Ct Hip Left Wo Contrast  07/12/2014   CLINICAL DATA:  Left hip pain status post fall at nursing home.  EXAM: CT OF THE LEFT HIP WITHOUT CONTRAST  TECHNIQUE: Multidetector CT imaging of the left hip was performed according to the standard protocol. Multiplanar CT image reconstructions were also generated.  COMPARISON:  None.  FINDINGS: There is no acute left hip fracture, dislocation or avascular necrosis. There is no lytic or sclerotic osseous lesion. AP left superior and inferior pubic rami are intact.  There is no soft tissue mass or fluid collection. There is no hematoma. The muscles are normal. There is peripheral vascular atherosclerotic disease.  IMPRESSION: No acute osseous injury of the left hip.   Electronically Signed   By: Kathreen Devoid   On: 07/12/2014 15:39   Dg Chest Port 1 View  07/12/2014   CLINICAL DATA:  Recent fall with right shoulder pain, initial encounter  EXAM: PORTABLE CHEST - 1 VIEW  COMPARISON:  07/30/2013, 05/05/14  FINDINGS: Cardiac shadow is within normal limits. Chronic changes are noted in the left apex consistent with prior radiation change. The changes seen in the right lung base are less well visualized on the current study. No new focal  infiltrate is seen. No acute bony abnormality is noted.  IMPRESSION: Changes in the right lower lobe are not well appreciated on this exam. Chronic changes in the left apex are seen.   Electronically Signed   By: Inez Catalina M.D.   On: 07/12/2014 11:13     Assessment/Plan: 80 year old male presenting with complaints of weakness and frequent falls.  From review of the patient's records it appears that he has had a gait disorder for many years now that has been slowly progressive.  No evidence of neuropathy noted and blood work has shown no significant abnormalities.  A1c has been slightly elevated at 6.5.  Patient has been using a walker for gait.  No source of infection found with this hospitalization.  Doubt this is related to his MG which is ocular.  It would be unusual for it to now present as a gait problem particularly in this setting where there is no other worsening of his myasthenic symptoms.  Suspect gait disorder is continuing to slowly progress.  This in addition to his poor vision would make walking even more challenging.  From review of the records it does not appear that the patient has ever had imaging in work up of his gait.  Therapy has been the intervention in the past.  Can not rule out that his overall decline may be secondary to a worsening of his hepatic function.  Recommendations: 1.  MRI of the brain without contrast.  This may be performed as an outpatient and followed up by Dr. Jannifer Franklin, his outpatient neurologist.   2.  PT-patient has refused in the past 3.  Continue Mestinon and Prednisone at current dose.    Alexis Goodell, MD [facility] [phone] 07/12/2014, 7:09 PM

## 2014-07-12 NOTE — H&P (Signed)
History and Physical  Marvin [name] DPO:[id number] DOB: 20-Nov-1913 DOA: 07/12/2014  Referring physician: Dr. Johnney Killian in ED PCP: Thressa Sheller, MD   Chief Complaint: fall, hip pain  HPI:  80 year old man presented to the emergency department with history of 2 falls in the last 48 hours, now with left hip pain and low back pain. Per family difficulty walking was relatively acute in onset, initial laboratory studies were relatively unrevealing and patient was referred for observation for further evaluation of his generalized weakness.  Patient lives at independent living but has 24 hour caretakers. Family is looking into assisted living when a bed is available. He was recently in the emergency department for suicide attempt with Tylenol but was eventually cleared for discharge.  He fell approximately 2 days ago. He fell again early this morning approximately 5 AM when attempting to go the bathroom, he lost his balance and fell backwards landing on his buttocks and striking his head. No loss of consciousness. Since that time he has had left hip pain. He had some low back pain earlier but this has resolved. Family reports this is chronic. He has chronic right shoulder and left shoulder pain. He has chronic tremor of the left arm.  Review of neurology notes as an outpatient notable for gait disorder, Mestinon was increased back in August and patient was noted to have difficulty standing at that time. However the family reports that he was ambulating well up to a week ago and was also driving and getting his own groceries. He has had no focal deficits or focal weakness.  In the emergency department afebrile with stable vital signs. No hypoxia. Chronic kidney disease appears to be at baseline. Potassium 3.6. Point of care troponin was modestly elevated. Subsequent troponin was negative. ALT modestly elevated. Hemoglobin stable 9.2. WBC 13.4. Urinalysis was negative. X-ray of the left hip was  negative for fracture. Osteoarthritis was seen. Chest x-ray no acute changes. EKG shows sinus rhythm, no acute changes seen. Consider left ventricular hypertrophy with repolarization abnormality.  Review of Systems:  Negative for fever, new visual changes, sore throat, rash, chest pain, SOB, dysuria, bleeding, n/v/abdominal pain.  Past Medical History  Diagnosis Date  . Hyperlipidemia   . Hypertension   . Dizziness   . Blood clot in vein   . Arthritis   . Peripheral arterial disease   . Carotid artery occlusion   . Ocular myasthenia gravis   . BPH (benign prostatic hyperplasia)   . Nephrolithiasis   . Low back pain   . Gait disorder   . Hx of radiation therapy 07/06/12, 07/08/12, 07/13/12    left upper lung nodule, 3 fractions  . Hearing difficulty   . Lumbago   . Abnormality of gait 05/09/2013  . Hearing deficit     right hearing aid  . Peripheral edema   . Lung cancer 05/2012    left    Past Surgical History  Procedure Laterality Date  . Appendectomy    . Cataract extraction      bilateral  . Retinal detachment surgery  02/2011  . Thyroidectomy, partial    . Transthoracic echocardiogram  01/15/2011    EF=>55%, mild conc LVH; mild MR & mild mitral annular calcification; mild TR, RVSP 30-76mmHg; aortic root sclerosis/calcification  . Carotid doppler  04/2011    80-99% stenosis of right prox to mid ICA    Social History:  reports that he quit smoking about 57 years ago. His smoking use included Cigarettes. He has  a 60 pack-year smoking history. He has never used smokeless tobacco. He reports that he does not drink alcohol or use illicit drugs.  Allergies  Allergen Reactions  . Hydrocodone     unknown    Family History  Problem Relation Age of Onset  . Bone cancer Mother   . Pneumonia Father   . Stroke Sister   . Hypertension Sister   . Heart disease Sister      Prior to Admission medications   Medication Sig Start Date End Date Taking? Authorizing Provider    acetaminophen (TYLENOL) 325 MG tablet Take 2 tablets (650 mg total) by mouth every 6 (six) hours as needed for mild pain (or Fever >/= 101). 08/02/13  Yes Robbie Lis, MD  amLODipine (NORVASC) 2.5 MG tablet Take 2.5 mg by mouth daily.   Yes Historical Provider, MD  aspirin 81 MG chewable tablet Chew 81 mg by mouth every morning.    Yes Historical Provider, MD  furosemide (LASIX) 20 MG tablet Take 20 mg by mouth daily. At noon   Yes Historical Provider, MD  furosemide (LASIX) 40 MG tablet Take 40 mg by mouth every morning.   Yes Historical Provider, MD  hydrochlorothiazide (HYDRODIURIL) 25 MG tablet Take 25 mg by mouth daily.    Yes Historical Provider, MD  Influenza vac split quadrivalent PF (FLUARIX) 0.5 ML injection Inject 0.5 mLs into the muscle once.   Yes Historical Provider, MD  metoprolol tartrate (LOPRESSOR) 25 MG tablet Take 1/2 tablet daily 04/26/14  Yes Troy Sine, MD  Multiple Vitamin (MULTIVITAMIN WITH MINERALS) TABS Take 1 tablet by mouth every morning.    Yes Historical Provider, MD  predniSONE (DELTASONE) 5 MG tablet TAKE 1 TABLET BY MOUTH THREE TIMES DAILY 06/22/14  Yes Kathrynn Ducking, MD  pyridostigmine (MESTINON) 60 MG tablet Take 1 tablet (60 mg total) by mouth 3 (three) times daily. 06/20/14  Yes Kathrynn Ducking, MD  simvastatin (ZOCOR) 5 MG tablet Take 5 mg by mouth at bedtime.   Yes Historical Provider, MD  Tamsulosin HCl (FLOMAX) 0.4 MG CAPS Take 0.4 mg by mouth at bedtime.   Yes Historical Provider, MD  Vitamin D, Ergocalciferol, (DRISDOL) 50000 UNITS CAPS Take 50,000 Units by mouth every 7 (seven) days. On Sundays   Yes Historical Provider, MD   Physical Exam: Filed Vitals:   07/12/14 0826 07/12/14 0950 07/12/14 1200  BP: 127/57 131/45 117/43  Pulse: 77 80 82  Temp: 98.7 F (37.1 C)    TempSrc: Oral    Resp: 20 21 21   SpO2: 93% 96% 94%    General:  Examined in the emergency department. Appears calm and comfortable Eyes: PERRL, normal lids, irises  ENT:  grossly normal hearing, lips & tongue hard of hearing..  Neck: no LAD, masses or thyromegaly Cardiovascular: RRR, no m/r/g. No LE edema. Respiratory: CTA bilaterally, no w/r/r. Normal respiratory effort. Abdomen: soft, ntnd Skin: no rash or induration seen  Musculoskeletal: grossly normal tone BUE/BLE; he has some pain with manipulation of the left hip but excellent strength of the hip and leg. Low back is nontender to palpation. His back and general appears unremarkable. Psychiatric: grossly normal mood and affect, speech fluent and appropriate Neurologic: grossly non-focal.  Wt Readings from Last 3 Encounters:  05/17/14 80.74 kg (178 lb)  04/27/14 84.278 kg (185 lb 12.8 oz)  04/12/14 84.369 kg (186 lb)    Labs on Admission:  Basic Metabolic Panel:  Recent Labs Lab 07/12/14 1005  NA  140  K 3.6*  CL 97  CO2 28  GLUCOSE 95  BUN 39*  CREATININE 1.85*  CALCIUM 9.2    Liver Function Tests:  Recent Labs Lab 07/12/14 1005  AST 33  ALT 59*  ALKPHOS 123*  BILITOT 0.5  PROT 6.1  ALBUMIN 2.9*    CBC:  Recent Labs Lab 07/12/14 1005  WBC 13.4*  NEUTROABS 10.8*  HGB 9.2*  HCT 28.3*  MCV 92.8  PLT 299    Cardiac Enzymes:  Recent Labs Lab 07/12/14 1529  TROPONINI <0.30      Recent Labs  07/12/14 1016  TROPIPOC 0.09*     Radiological Exams on Admission: Dg Lumbar Spine Complete  07/12/2014   CLINICAL DATA:  Low back pain.  EXAM: LUMBAR SPINE - COMPLETE 4+ VIEW  COMPARISON:  Plain films lumbar spine 04/06/2012.  FINDINGS: Vertebral body height and alignment are maintained. Lower lumbar spondylosis is unchanged. Aortic atherosclerosis is again seen with a small aneurysm measuring 3.3 cm is unchanged  IMPRESSION: No acute finding.  No change in lower lumbar spondylosis.  No change in aortoiliac atherosclerosis and small aneurysm measuring 3.3 cm.   Electronically Signed   By: Inge Rise M.D.   On: 07/12/2014 15:30   Dg Hip Complete Left  07/12/2014    CLINICAL DATA:  Fall with left hip pain.  Initial encounter.  EXAM: LEFT HIP - COMPLETE 2+ VIEW  COMPARISON:  None.  FINDINGS: No acute fracture or dislocation is identified. Mild osteoarthritis noted of both hip joints. The bony pelvis is intact without evidence of visible fracture or diastasis. No bony lesions are seen. Vascular calcifications are identified in the iliac and femoral arteries bilaterally. Soft tissues are unremarkable.  IMPRESSION: No acute fracture or dislocation. Osteoarthritis of both hip joints.   Electronically Signed   By: Aletta Edouard M.D.   On: 07/12/2014 09:10   Ct Hip Left Wo Contrast  07/12/2014   CLINICAL DATA:  Left hip pain status post fall at nursing home.  EXAM: CT OF THE LEFT HIP WITHOUT CONTRAST  TECHNIQUE: Multidetector CT imaging of the left hip was performed according to the standard protocol. Multiplanar CT image reconstructions were also generated.  COMPARISON:  None.  FINDINGS: There is no acute left hip fracture, dislocation or avascular necrosis. There is no lytic or sclerotic osseous lesion. AP left superior and inferior pubic rami are intact.  There is no soft tissue mass or fluid collection. There is no hematoma. The muscles are normal. There is peripheral vascular atherosclerotic disease.  IMPRESSION: No acute osseous injury of the left hip.   Electronically Signed   By: Kathreen Devoid   On: 07/12/2014 15:39   Dg Chest Port 1 View  07/12/2014   CLINICAL DATA:  Recent fall with right shoulder pain, initial encounter  EXAM: PORTABLE CHEST - 1 VIEW  COMPARISON:  07/30/2013, 05/05/14  FINDINGS: Cardiac shadow is within normal limits. Chronic changes are noted in the left apex consistent with prior radiation change. The changes seen in the right lung base are less well visualized on the current study. No new focal infiltrate is seen. No acute bony abnormality is noted.  IMPRESSION: Changes in the right lower lobe are not well appreciated on this exam. Chronic  changes in the left apex are seen.   Electronically Signed   By: Inez Catalina M.D.   On: 07/12/2014 11:13     Principal Problem:   Generalized weakness Active Problems:   Myasthenia gravis  Chronic kidney disease, stage III (moderate)   Left hip pain   Assessment/Plan 1. Fall prior to admission with a resultant left hip pain and reported low back pain.he has excellent bilateral lower extremity strength. CT of the hip was negative for acute abnormality. Suspect secondary to generalized weakness secondary to advanced age. Doubt -10 he appears  2. Left hip pain, negative imaging. No further evaluation suggested. Plan is therapy consultation.  3. Acute on chronic back pain. resolved at this point. Imaging negative.  4. Elevated point-of-care troponin with normal serum troponin. No further evaluation planned.  5. Hypokalemia. replete potassium.  6. Leukocytosis. likely stress-induced. No evidence of infection.  7. Chronic normocytic anemia. Stable. 8. Chronic kidney disease stage III-IV 9. Recently seen for adjustment disorder with distubance of emotions and conduct. Psychiatry recommended ALF 10/31. At that time "His big issue is frustration of not able to care for himself" per psychiatry note. 10. Asymptomatic bilateral carotid artery stenosis. Managed with aspirin Plavix. Based on cardiology visit in 9/30, conservative medical therapy was recommended. 11. History of myasthenia gravis, currently with ocular features. Neurology noted the patient requires assistance with standing from a seated position 8/26. Patient was able to walk with a walker at that time. Gait disorder was noted at that time and the assessment and plan as was bilateral foot drop.   Plan observation.  CT of the hip and imaging of the lumbar spine has now been obtained and films were unremarkable.  Physical therapy consultation.  Neurology consultation appreciated, discussed with Dr. Doy Mince.  Replete  potassium.  CBC and basic metabolic panel in the morning.  Code Status: DNR  DVT prophylaxis: SCDs Family Communication: discussed with daughter in detail Disposition Plan/Anticipated LOS: obs, <24 hours  Time spent: 38 minutes  Murray Hodgkins, MD  [facility] Pager [phone] 07/12/2014, 2:11 PM

## 2014-07-12 NOTE — Progress Notes (Signed)
CM provided daughter with list of private duty nursing agencies after she mentioned she had driven from [place] cancelled pt personal care services for the evening.   CM notes pt is being seen by a hospitalist

## 2014-07-12 NOTE — ED Notes (Addendum)
Per ems pt is from [facility]. Pt reports he fell at 0500 trying to get back in bed, pt missed bed, fell onto buttocks, hit his head, no signs of bruising on swelling to head, denies LOC. Staff assisted pt back into chair. Later pt started having bil hip /paraspinus back pain, pain x1 month, but increased today, was concerned because pain increased after fall.pt did not try to catch himself during fall. Chronic right shoulder pain 4/10. Pt reports he is "restricted to 2 650 tylenol tablets per day".

## 2014-07-12 NOTE — ED Notes (Signed)
Report given to Emily, RN.

## 2014-07-12 NOTE — ED Notes (Signed)
Bed: QA06 Expected date:  Expected time:  Means of arrival:  Comments: EMS fall

## 2014-07-12 NOTE — Progress Notes (Signed)
Met with daughter. Discussed call from Personal care services provider.

## 2014-07-12 NOTE — ED Notes (Signed)
Pt to xray

## 2014-07-12 NOTE — ED Notes (Signed)
md at bedside

## 2014-07-12 NOTE — ED Notes (Signed)
hospitalist at bedside

## 2014-07-13 LAB — BASIC METABOLIC PANEL
Anion gap: 14 (ref 5–15)
BUN: 34 mg/dL — AB (ref 6–23)
CHLORIDE: 102 meq/L (ref 96–112)
CO2: 25 mEq/L (ref 19–32)
Calcium: 8.7 mg/dL (ref 8.4–10.5)
Creatinine, Ser: 1.66 mg/dL — ABNORMAL HIGH (ref 0.50–1.35)
GFR calc Af Amer: 38 mL/min — ABNORMAL LOW (ref >90)
GFR, EST NON AFRICAN AMERICAN: 33 mL/min — AB (ref >90)
Glucose, Bld: 129 mg/dL — ABNORMAL HIGH (ref 70–99)
POTASSIUM: 3.9 meq/L (ref 3.7–5.3)
Sodium: 141 mEq/L (ref 137–147)

## 2014-07-13 LAB — CBC
HEMATOCRIT: 27.3 % — AB (ref 39.0–52.0)
HEMOGLOBIN: 9 g/dL — AB (ref 13.0–17.0)
MCH: 30.8 pg (ref 26.0–34.0)
MCHC: 33 g/dL (ref 30.0–36.0)
MCV: 93.5 fL (ref 78.0–100.0)
Platelets: 283 10*3/uL (ref 150–400)
RBC: 2.92 MIL/uL — ABNORMAL LOW (ref 4.22–5.81)
RDW: 15.9 % — ABNORMAL HIGH (ref 11.5–15.5)
WBC: 9.7 10*3/uL (ref 4.0–10.5)

## 2014-07-13 NOTE — Progress Notes (Signed)
UR completed 

## 2014-07-13 NOTE — Discharge Instructions (Signed)

## 2014-07-13 NOTE — Progress Notes (Signed)
Discharge instructions given to pt/family, verbalized understanding. Left the unit in stable condition. 

## 2014-07-13 NOTE — Discharge Summary (Addendum)
Physician Discharge Summary  Marvin [name] OJJ:[id number] DOB: 11/08/1913 DOA: 07/12/2014  PCP: Thressa Sheller, MD  Admit date: 07/12/2014 Discharge date: 07/13/2014  Recommendations for Outpatient Follow-up:  1. Pt will need to follow up with PCP in 2-3 weeks post discharge 2. Please obtain BMP to evaluate electrolytes and kidney function 3. Please also check CBC to evaluate Hg and Hct levels 4. Pt advised to only take Lasix once daily until renal function stabilizes   Discharge Diagnoses:  Principal Problem:   Generalized weakness Active Problems:   Myasthenia gravis   Chronic kidney disease, stage III (moderate)   Left hip pain  Discharge Condition: Stable  Diet recommendation: Heart healthy diet discussed in details   Brief narrative: 80 year old man presented to the emergency department with history of 2 falls in the last 48 hours, now with left hip pain and low back pain. Per family difficulty walking was relatively acute in onset, initial laboratory studies were relatively unrevealing and patient was referred for observation for further evaluation of his generalized weakness.  Patient lives at independent living but has 24 hour caretakers. Family is looking into assisted living when a bed is available. He was recently in the emergency department for suicide attempt with Tylenol but was eventually cleared for discharge.  He fell approximately 2 days ago. He fell again early this morning approximately 5 AM when attempting to go the bathroom, he lost his balance and fell backwards landing on his buttocks and striking his head. No loss of consciousness. Since that time he has had left hip pain. He had some low back pain earlier but this has resolved. Family reports this is chronic. He has chronic right shoulder and left shoulder pain. He has chronic tremor of the left arm.  Review of neurology notes as an outpatient notable for gait disorder, Mestinon was increased back in  August and patient was noted to have difficulty standing at that time. However the family reports that he was ambulating well up to a week ago and was also driving and getting his own groceries. He has had no focal deficits or focal weakness.  In the emergency department afebrile with stable vital signs. No hypoxia. Chronic kidney disease appears to be at baseline. Potassium 3.6. Point of care troponin was modestly elevated. Subsequent troponin was negative. ALT modestly elevated. Hemoglobin stable 9.2. WBC 13.4. Urinalysis was negative. X-ray of the left hip was negative for fracture. Osteoarthritis was seen. Chest x-ray no acute changes. EKG shows sinus rhythm, no acute changes seen. Consider left ventricular hypertrophy with repolarization abnormality.   Assessment and Plan:    Principal Problem:  Generalized weakness - chronic in nature - no fractures on imaging studies below  - PT upon discharge  Active Problems:  Myasthenia gravis - on pyridostigmine   Chronic kidney disease, stage III (moderate), acute on chronic  - Cr improving, trending down - continue Lasix once daily as noted below   Left hip pain - stable, less pain - attempt PT if pt able to tolerate   Anemia of chronic disease, CKD - Hg stable, no signs of active bleeding  HTN - continue Norvasc   HLD - continue statin   No Antibiotics Provided SCD for DVT prophylaxis while inpatient   D/W pt at bedside, plan for discharge today to SNF  Discharge Exam: Filed Vitals:   07/13/14 0509  BP: 129/57  Pulse: 82  Temp: 98.1 F (36.7 C)  Resp: 18   Filed Vitals:   07/12/14 1530  07/12/14 1603 07/12/14 2056 07/13/14 0509  BP: 133/51 119/44 119/48 129/57  Pulse:  82 78 82  Temp:  98.1 F (36.7 C) 98 F (36.7 C) 98.1 F (36.7 C)  TempSrc:  Oral Oral Oral  Resp: 15 16 18 18   Height:  5\' 7"  (1.702 m)    SpO2:  97% 97% 93%    General: Pt is alert, follows commands appropriately, not in acute  distress Cardiovascular: Regular rate and rhythm, no rubs, no gallops Respiratory: Clear to auscultation bilaterally, no wheezing, no crackles, no rhonchi Abdominal: Soft, non tender, non distended, bowel sounds +, no guarding  Discharge Instructions  Discharge Instructions    Diet - low sodium heart healthy    Complete by:  As directed      Increase activity slowly    Complete by:  As directed             Medication List    STOP taking these medications        Influenza vac split quadrivalent PF 0.5 ML injection  Commonly known as:  FLUARIX      TAKE these medications        acetaminophen 325 MG tablet  Commonly known as:  TYLENOL  Take 2 tablets (650 mg total) by mouth every 6 (six) hours as needed for mild pain (or Fever >/= 101).     amLODipine 2.5 MG tablet  Commonly known as:  NORVASC  Take 2.5 mg by mouth daily.     aspirin 81 MG chewable tablet  Chew 81 mg by mouth every morning.     furosemide 40 MG tablet  Commonly known as:  LASIX  Take 40 mg by mouth every morning.     hydrochlorothiazide 25 MG tablet  Commonly known as:  HYDRODIURIL  Take 25 mg by mouth daily.     metoprolol tartrate 25 MG tablet  Commonly known as:  LOPRESSOR  Take 1/2 tablet daily     multivitamin with minerals Tabs tablet  Take 1 tablet by mouth every morning.     predniSONE 5 MG tablet  Commonly known as:  DELTASONE  TAKE 1 TABLET BY MOUTH THREE TIMES DAILY     pyridostigmine 60 MG tablet  Commonly known as:  MESTINON  Take 1 tablet (60 mg total) by mouth 3 (three) times daily.     simvastatin 5 MG tablet  Commonly known as:  ZOCOR  Take 5 mg by mouth at bedtime.     tamsulosin 0.4 MG Caps capsule  Commonly known as:  FLOMAX  Take 0.4 mg by mouth at bedtime.     Vitamin D (Ergocalciferol) 50000 UNITS Caps capsule  Commonly known as:  DRISDOL  Take 50,000 Units by mouth every 7 (seven) days. On Sundays           Follow-up Information    Follow up with  Thressa Sheller, MD.   Specialty:  Internal Medicine   Contact information:   [address] [place] [address] [phone]        The results of significant diagnostics from this hospitalization (including imaging, microbiology, ancillary and laboratory) are listed below for reference.     Microbiology: No results found for this or any previous visit (from the past 240 hour(s)).   Labs: Basic Metabolic Panel:  Recent Labs Lab 07/12/14 1005 07/13/14 0535  NA 140 141  K 3.6* 3.9  CL 97 102  CO2 28 25  GLUCOSE 95 129*  BUN 39*  34*  CREATININE 1.85* 1.66*  CALCIUM 9.2 8.7   Liver Function Tests:  Recent Labs Lab 07/12/14 1005  AST 33  ALT 59*  ALKPHOS 123*  BILITOT 0.5  PROT 6.1  ALBUMIN 2.9*   CBC:  Recent Labs Lab 07/12/14 1005 07/13/14 0535  WBC 13.4* 9.7  NEUTROABS 10.8*  --   HGB 9.2* 9.0*  HCT 28.3* 27.3*  MCV 92.8 93.5  PLT 299 283   Cardiac Enzymes:  Recent Labs Lab 07/12/14 1529  TROPONINI <0.30   BNP: BNP (last 3 results)  Recent Labs  07/30/13 1729  PROBNP 1356.0*   CBG: No results for input(s): GLUCAP in the last 168 hours.   SIGNED: Time coordinating discharge: Over 30 minutes  Faye Ramsay, MD  [facility] 07/13/2014, 11:09 AM Pager [phone]  If 7PM-7AM, please contact night-coverage [email or web address] Password [email or web address]

## 2014-07-13 NOTE — Plan of Care (Signed)
Problem: Discharge Progression Outcomes Goal: Discharge plan in place and appropriate Outcome: Completed/Met Date Met:  07/13/14 Goal: Pain controlled with appropriate interventions Outcome: Completed/Met Date Met:  07/13/14 Goal: Hemodynamically stable Outcome: Completed/Met Date Met:  08/11/82 Goal: Complications resolved/controlled Outcome: Completed/Met Date Met:  07/13/14 Goal: Tolerating diet Outcome: Completed/Met Date Met:  07/13/14 Goal: Activity appropriate for discharge plan Outcome: Completed/Met Date Met:  07/13/14 Goal: Other Discharge Outcomes/Goals Outcome: Completed/Met Date Met:  07/13/14

## 2014-07-13 NOTE — Evaluation (Addendum)
Physical Therapy Evaluation Patient Details Name: Marvin [name] MRN: [id number] DOB: 09-Mar-1914 Today's Date: 07/13/2014   History of Present Illness  80 year old man presented to the emergency department with history of 2 falls in 2 days PTA, now with left hip pain and low back pain. Pt has h/o ocular myesthenia gravis, OA, LBP,  HTN, lung cancer. Pt has elevated LFTs.   Clinical Impression  *Pt admitted with multiple falls, generalized weakness*. Pt currently with functional limitations due to the deficits listed below (see PT Problem List).  Pt will benefit from skilled PT to increase their independence and safety with mobility to allow discharge to the venue listed below.   Mod assist for supine to sit. Min A to walk 8' with RW. Pt at high risk for further falls, 24* assist recommended. Pt stated he plans to have private aide at his ALF.    **    Follow Up Recommendations  HHPT    Equipment Recommendations    3 in 1   Recommendations for Other Services       Precautions / Restrictions Precautions Precautions: Fall Precaution Comments: pt reports 6-7 falls in past year, poor vision (cannot read, but can see objects) Restrictions Weight Bearing Restrictions: No      Mobility  Bed Mobility Overal bed mobility: Needs Assistance Bed Mobility: Supine to Sit     Supine to sit: Mod assist     General bed mobility comments: assist to raise trunk and scoot to EOB  Transfers Overall transfer level: Needs assistance Equipment used: Rolling walker (2 wheeled) Transfers: Sit to/from Stand Sit to Stand: Mod assist         General transfer comment: assist to rise, cues for hand placement  Ambulation/Gait Ambulation/Gait assistance: Min assist Ambulation Distance (Feet): 8 Feet Assistive device: Rolling walker (2 wheeled) Gait Pattern/deviations: Step-through pattern;Decreased stride length   Gait velocity interpretation: Below normal speed for age/gender General  Gait Details: pt anxious about falling but had no LOB, distance limited by fatigue/fear of falling, min A to maneuver RW   Stairs            Wheelchair Mobility    Modified Rankin (Stroke Patients Only)       Balance Overall balance assessment: Needs assistance   Sitting balance-Leahy Scale: Good     Standing balance support: Bilateral upper extremity supported Standing balance-Leahy Scale: Poor                               Pertinent Vitals/Pain Pain Assessment: 0-10 Pain Score: 5  Pain Location: L buttock with activity Pain Descriptors / Indicators: Sore Pain Intervention(s): Monitored during session;Limited activity within patient's tolerance    Home Living Family/patient expects to be discharged to:: Assisted living               Home Equipment: Walker - 4 wheels Additional Comments: lives Independent living    Prior Function Level of Independence: Needs assistance   Gait / Transfers Assistance Needed: independently walks to dining room with rollator  ADL's / Homemaking Assistance Needed: assist for bathing/dressing        Hand Dominance        Extremity/Trunk Assessment   Upper Extremity Assessment: Overall WFL for tasks assessed           Lower Extremity Assessment: LLE deficits/detail   LLE Deficits / Details: L knee extension -4/5, ankle WNL, L hip AROM decr 50%  limited by pain  Cervical / Trunk Assessment: Normal  Communication   Communication: HOH  Cognition Arousal/Alertness: Awake/alert Behavior During Therapy: WFL for tasks assessed/performed Overall Cognitive Status: Within Functional Limits for tasks assessed                      General Comments      Exercises        Assessment/Plan    PT Assessment Patient needs continued PT services  PT Diagnosis Difficulty walking;Generalized weakness;Acute pain   PT Problem List Decreased strength;Decreased range of motion;Decreased activity  tolerance;Decreased balance;Pain;Decreased mobility  PT Treatment Interventions DME instruction;Gait training;Functional mobility training;Therapeutic activities;Patient/family education;Balance training;Therapeutic exercise   PT Goals (Current goals can be found in the Care Plan section) Acute Rehab PT Goals Patient Stated Goal: to be able to walk PT Goal Formulation: With patient Time For Goal Achievement: 07/27/14 Potential to Achieve Goals: Good    Frequency Min 3X/week   Barriers to discharge        Co-evaluation               End of Session Equipment Utilized During Treatment: Gait belt Activity Tolerance: Patient limited by fatigue;Patient limited by pain Patient left: in chair;with call bell/phone within reach Nurse Communication: Mobility status    Functional Assessment Tool Used: clinical judgement Functional Limitation: Mobility: Walking and moving around Mobility: Walking and Moving Around Current Status (U1324): At least 40 percent but less than 60 percent impaired, limited or restricted Mobility: Walking and Moving Around Goal Status [phone]): At least 20 percent but less than 40 percent impaired, limited or restricted    Time: 0753-0814 PT Time Calculation (min) (ACUTE ONLY): 21 min   Charges:   PT Evaluation $Initial PT Evaluation Tier I: 1 Procedure PT Treatments $Therapeutic Activity: 8-22 mins   PT G Codes:   Functional Assessment Tool Used: clinical judgement Functional Limitation: Mobility: Walking and moving around    [place], [employer] 07/13/2014, 8:24 AM [phone]

## 2014-08-22 ENCOUNTER — Emergency Department (HOSPITAL_COMMUNITY): Payer: Medicare Other

## 2014-08-22 ENCOUNTER — Inpatient Hospital Stay (HOSPITAL_COMMUNITY)
Admission: EM | Admit: 2014-08-22 | Discharge: 2014-08-28 | DRG: 194 | Disposition: A | Discharge status: Skilled Nursing Facility | Payer: Medicare Other | Attending: Internal Medicine | Admitting: Internal Medicine

## 2014-08-22 ENCOUNTER — Encounter (HOSPITAL_COMMUNITY): Payer: Self-pay | Admitting: *Deleted

## 2014-08-22 ENCOUNTER — Telehealth (HOSPITAL_COMMUNITY): Payer: Self-pay | Admitting: *Deleted

## 2014-08-22 DIAGNOSIS — E440 Moderate protein-calorie malnutrition: Secondary | ICD-10-CM | POA: Diagnosis present

## 2014-08-22 DIAGNOSIS — C3412 Malignant neoplasm of upper lobe, left bronchus or lung: Secondary | ICD-10-CM

## 2014-08-22 DIAGNOSIS — Y95 Nosocomial condition: Secondary | ICD-10-CM | POA: Diagnosis present

## 2014-08-22 DIAGNOSIS — G7000 Myasthenia gravis without (acute) exacerbation: Secondary | ICD-10-CM | POA: Diagnosis present

## 2014-08-22 DIAGNOSIS — Z7982 Long term (current) use of aspirin: Secondary | ICD-10-CM

## 2014-08-22 DIAGNOSIS — I739 Peripheral vascular disease, unspecified: Secondary | ICD-10-CM | POA: Diagnosis present

## 2014-08-22 DIAGNOSIS — S43006A Unspecified dislocation of unspecified shoulder joint, initial encounter: Secondary | ICD-10-CM

## 2014-08-22 DIAGNOSIS — E785 Hyperlipidemia, unspecified: Secondary | ICD-10-CM | POA: Diagnosis present

## 2014-08-22 DIAGNOSIS — D631 Anemia in chronic kidney disease: Secondary | ICD-10-CM | POA: Diagnosis present

## 2014-08-22 DIAGNOSIS — H9190 Unspecified hearing loss, unspecified ear: Secondary | ICD-10-CM | POA: Diagnosis present

## 2014-08-22 DIAGNOSIS — S42309A Unspecified fracture of shaft of humerus, unspecified arm, initial encounter for closed fracture: Secondary | ICD-10-CM

## 2014-08-22 DIAGNOSIS — S42301A Unspecified fracture of shaft of humerus, right arm, initial encounter for closed fracture: Secondary | ICD-10-CM

## 2014-08-22 DIAGNOSIS — C7951 Secondary malignant neoplasm of bone: Secondary | ICD-10-CM | POA: Diagnosis present

## 2014-08-22 DIAGNOSIS — Z6825 Body mass index (BMI) 25.0-25.9, adult: Secondary | ICD-10-CM

## 2014-08-22 DIAGNOSIS — C3410 Malignant neoplasm of upper lobe, unspecified bronchus or lung: Secondary | ICD-10-CM | POA: Diagnosis present

## 2014-08-22 DIAGNOSIS — I10 Essential (primary) hypertension: Secondary | ICD-10-CM

## 2014-08-22 DIAGNOSIS — J189 Pneumonia, unspecified organism: Secondary | ICD-10-CM | POA: Diagnosis present

## 2014-08-22 DIAGNOSIS — Z87891 Personal history of nicotine dependence: Secondary | ICD-10-CM

## 2014-08-22 DIAGNOSIS — I129 Hypertensive chronic kidney disease with stage 1 through stage 4 chronic kidney disease, or unspecified chronic kidney disease: Secondary | ICD-10-CM | POA: Diagnosis present

## 2014-08-22 DIAGNOSIS — Z8249 Family history of ischemic heart disease and other diseases of the circulatory system: Secondary | ICD-10-CM

## 2014-08-22 DIAGNOSIS — N183 Chronic kidney disease, stage 3 (moderate): Secondary | ICD-10-CM | POA: Diagnosis present

## 2014-08-22 DIAGNOSIS — D500 Iron deficiency anemia secondary to blood loss (chronic): Secondary | ICD-10-CM | POA: Diagnosis present

## 2014-08-22 DIAGNOSIS — Z66 Do not resuscitate: Secondary | ICD-10-CM | POA: Diagnosis present

## 2014-08-22 DIAGNOSIS — Z9181 History of falling: Secondary | ICD-10-CM | POA: Diagnosis not present

## 2014-08-22 DIAGNOSIS — Z515 Encounter for palliative care: Secondary | ICD-10-CM | POA: Diagnosis not present

## 2014-08-22 DIAGNOSIS — Z923 Personal history of irradiation: Secondary | ICD-10-CM | POA: Diagnosis not present

## 2014-08-22 DIAGNOSIS — R627 Adult failure to thrive: Secondary | ICD-10-CM | POA: Diagnosis present

## 2014-08-22 DIAGNOSIS — Z809 Family history of malignant neoplasm, unspecified: Secondary | ICD-10-CM

## 2014-08-22 DIAGNOSIS — M84421A Pathological fracture, right humerus, initial encounter for fracture: Secondary | ICD-10-CM | POA: Diagnosis present

## 2014-08-22 DIAGNOSIS — M79601 Pain in right arm: Secondary | ICD-10-CM | POA: Diagnosis not present

## 2014-08-22 DIAGNOSIS — R0600 Dyspnea, unspecified: Secondary | ICD-10-CM

## 2014-08-22 DIAGNOSIS — N400 Enlarged prostate without lower urinary tract symptoms: Secondary | ICD-10-CM | POA: Diagnosis present

## 2014-08-22 DIAGNOSIS — Z87442 Personal history of urinary calculi: Secondary | ICD-10-CM

## 2014-08-22 DIAGNOSIS — R531 Weakness: Secondary | ICD-10-CM

## 2014-08-22 LAB — COMPREHENSIVE METABOLIC PANEL
ALK PHOS: 106 U/L (ref 39–117)
ALT: 31 U/L (ref 0–53)
AST: 36 U/L (ref 0–37)
Albumin: 2.8 g/dL — ABNORMAL LOW (ref 3.5–5.2)
Anion gap: 10 (ref 5–15)
BILIRUBIN TOTAL: 1.2 mg/dL (ref 0.3–1.2)
BUN: 23 mg/dL (ref 6–23)
CHLORIDE: 104 meq/L (ref 96–112)
CO2: 22 mmol/L (ref 19–32)
Calcium: 8.5 mg/dL (ref 8.4–10.5)
Creatinine, Ser: 1.38 mg/dL — ABNORMAL HIGH (ref 0.50–1.35)
GFR calc Af Amer: 47 mL/min — ABNORMAL LOW (ref >90)
GFR, EST NON AFRICAN AMERICAN: 41 mL/min — AB (ref >90)
GLUCOSE: 134 mg/dL — AB (ref 70–99)
POTASSIUM: 4.5 mmol/L (ref 3.5–5.1)
SODIUM: 136 mmol/L (ref 135–145)
Total Protein: 5.5 g/dL — ABNORMAL LOW (ref 6.0–8.3)

## 2014-08-22 LAB — TROPONIN I: TROPONIN I: 0.03 ng/mL (ref <0.031)

## 2014-08-22 LAB — CBG MONITORING, ED: GLUCOSE-CAPILLARY: 141 mg/dL — AB (ref 70–99)

## 2014-08-22 LAB — CBC WITH DIFFERENTIAL/PLATELET
Basophils Absolute: 0 10*3/uL (ref 0.0–0.1)
Basophils Relative: 0 % (ref 0–1)
EOS ABS: 0 10*3/uL (ref 0.0–0.7)
EOS PCT: 0 % (ref 0–5)
HCT: 24.7 % — ABNORMAL LOW (ref 39.0–52.0)
Hemoglobin: 7.9 g/dL — ABNORMAL LOW (ref 13.0–17.0)
LYMPHS ABS: 0.4 10*3/uL — AB (ref 0.7–4.0)
LYMPHS PCT: 3 % — AB (ref 12–46)
MCH: 29.5 pg (ref 26.0–34.0)
MCHC: 32 g/dL (ref 30.0–36.0)
MCV: 92.2 fL (ref 78.0–100.0)
Monocytes Absolute: 1 10*3/uL (ref 0.1–1.0)
Monocytes Relative: 8 % (ref 3–12)
NEUTROS PCT: 89 % — AB (ref 43–77)
Neutro Abs: 10.7 10*3/uL — ABNORMAL HIGH (ref 1.7–7.7)
Platelets: 243 10*3/uL (ref 150–400)
RBC: 2.68 MIL/uL — AB (ref 4.22–5.81)
RDW: 17.1 % — ABNORMAL HIGH (ref 11.5–15.5)
WBC: 12.1 10*3/uL — AB (ref 4.0–10.5)

## 2014-08-22 LAB — BRAIN NATRIURETIC PEPTIDE: B Natriuretic Peptide: 1302.4 pg/mL — ABNORMAL HIGH (ref 0.0–100.0)

## 2014-08-22 MED ORDER — ENSURE COMPLETE PO LIQD
237.0000 mL | Freq: Two times a day (BID) | ORAL | Status: DC
  Administered 2014-08-23 – 2014-08-28 (×6): 237 mL via ORAL

## 2014-08-22 MED ORDER — DEXTROSE 5 % IV SOLN
1.0000 g | Freq: Three times a day (TID) | INTRAVENOUS | Status: DC
  Administered 2014-08-22: 1 g via INTRAVENOUS
  Filled 2014-08-22: qty 1

## 2014-08-22 MED ORDER — ACETAMINOPHEN 325 MG PO TABS
650.0000 mg | ORAL_TABLET | Freq: Four times a day (QID) | ORAL | Status: DC | PRN
  Administered 2014-08-24: 650 mg via ORAL
  Filled 2014-08-22: qty 2

## 2014-08-22 MED ORDER — DEXTROSE 5 % IV SOLN
1.0000 g | Freq: Three times a day (TID) | INTRAVENOUS | Status: DC
  Administered 2014-08-22 – 2014-08-23 (×2): 1 g via INTRAVENOUS
  Filled 2014-08-22 (×4): qty 1

## 2014-08-22 MED ORDER — HEPARIN SODIUM (PORCINE) 5000 UNIT/ML IJ SOLN: 5000.0000 [IU] | Freq: Three times a day (TID) | INTRAMUSCULAR | Status: DC

## 2014-08-22 MED ORDER — LEVOFLOXACIN IN D5W 500 MG/100ML IV SOLN
500.0000 mg | Freq: Once | INTRAVENOUS | Status: DC
  Filled 2014-08-22: qty 100

## 2014-08-22 MED ORDER — SIMVASTATIN 5 MG PO TABS
5.0000 mg | ORAL_TABLET | Freq: Every day | ORAL | Status: DC
  Administered 2014-08-22 – 2014-08-27 (×6): 5 mg via ORAL
  Filled 2014-08-22 (×7): qty 1

## 2014-08-22 MED ORDER — VANCOMYCIN HCL 10 G IV SOLR
1500.0000 mg | Freq: Once | INTRAVENOUS | Status: AC
  Administered 2014-08-22: 1500 mg via INTRAVENOUS
  Filled 2014-08-22: qty 1500

## 2014-08-22 MED ORDER — TAMSULOSIN HCL 0.4 MG PO CAPS: 0.4000 mg | ORAL_CAPSULE | Freq: Every day | ORAL | Status: DC

## 2014-08-22 MED ORDER — HEPARIN SODIUM (PORCINE) 5000 UNIT/ML IJ SOLN
5000.0000 [IU] | Freq: Three times a day (TID) | INTRAMUSCULAR | Status: DC
  Administered 2014-08-22 – 2014-08-28 (×18): 5000 [IU] via SUBCUTANEOUS
  Filled 2014-08-22 (×23): qty 1

## 2014-08-22 MED ORDER — PREDNISONE 5 MG PO TABS
5.0000 mg | ORAL_TABLET | Freq: Three times a day (TID) | ORAL | Status: DC
  Administered 2014-08-22 – 2014-08-28 (×18): 5 mg via ORAL
  Filled 2014-08-22 (×21): qty 1

## 2014-08-22 MED ORDER — ASPIRIN 81 MG PO CHEW
81.0000 mg | CHEWABLE_TABLET | ORAL | Status: DC
  Administered 2014-08-23 – 2014-08-28 (×6): 81 mg via ORAL
  Filled 2014-08-22 (×7): qty 1

## 2014-08-22 MED ORDER — PYRIDOSTIGMINE BROMIDE 60 MG PO TABS
60.0000 mg | ORAL_TABLET | Freq: Three times a day (TID) | ORAL | Status: DC
  Administered 2014-08-22 – 2014-08-28 (×18): 60 mg via ORAL
  Filled 2014-08-22 (×19): qty 1

## 2014-08-22 MED ORDER — TAMSULOSIN HCL 0.4 MG PO CAPS
0.4000 mg | ORAL_CAPSULE | Freq: Every day | ORAL | Status: DC
  Administered 2014-08-22 – 2014-08-27 (×6): 0.4 mg via ORAL
  Filled 2014-08-22 (×9): qty 1

## 2014-08-22 NOTE — ED Provider Notes (Signed)
CSN: [id number]     Arrival date & time 08/22/14  1803 History   First MD Initiated Contact with Patient 08/22/14 1806     Chief Complaint  Patient presents with  . Shortness of Breath     (Consider location/radiation/quality/duration/timing/severity/associated sxs/prior Treatment) Patient is a 81 y.o. male presenting with shortness of breath. The history is provided by the patient (the pt complains of weakness).  Shortness of Breath Severity:  Moderate Onset quality:  Sudden Timing:  Constant Progression:  Waxing and waning Chronicity:  New Context: activity   Associated symptoms: no abdominal pain, no chest pain, no cough, no headaches and no rash     Past Medical History  Diagnosis Date  . Hyperlipidemia   . Hypertension   . Dizziness   . Blood clot in vein   . Arthritis   . Peripheral arterial disease   . Carotid artery occlusion   . Ocular myasthenia gravis   . BPH (benign prostatic hyperplasia)   . Nephrolithiasis   . Low back pain   . Gait disorder   . Hx of radiation therapy 07/06/12, 07/08/12, 07/13/12    left upper lung nodule, 3 fractions  . Hearing difficulty   . Lumbago   . Abnormality of gait 05/09/2013  . Hearing deficit     right hearing aid  . Peripheral edema   . Lung cancer 05/2012    left   Past Surgical History  Procedure Laterality Date  . Appendectomy    . Cataract extraction      bilateral  . Retinal detachment surgery  02/2011  . Thyroidectomy, partial    . Transthoracic echocardiogram  01/15/2011    EF=>55%, mild conc LVH; mild MR & mild mitral annular calcification; mild TR, RVSP 30-38mmHg; aortic root sclerosis/calcification  . Carotid doppler  04/2011    80-99% stenosis of right prox to mid ICA   Family History  Problem Relation Age of Onset  . Bone cancer Mother   . Pneumonia Father   . Stroke Sister   . Hypertension Sister   . Heart disease Sister    History  Substance Use Topics  . Smoking status: Former Smoker -- 1.50  packs/day for 40 years    Types: Cigarettes    Quit date: 03/27/1957  . Smokeless tobacco: Never Used     Comment: Quit in 1958  . Alcohol Use: No    Review of Systems  Constitutional: Negative for appetite change and fatigue.  HENT: Negative for congestion, ear discharge and sinus pressure.   Eyes: Negative for discharge.  Respiratory: Positive for shortness of breath. Negative for cough.   Cardiovascular: Negative for chest pain.  Gastrointestinal: Negative for abdominal pain and diarrhea.  Genitourinary: Negative for frequency and hematuria.  Musculoskeletal: Negative for back pain.  Skin: Negative for rash.  Neurological: Negative for seizures and headaches.  Psychiatric/Behavioral: Negative for hallucinations.      Allergies  Hydrocodone  Home Medications   Prior to Admission medications   Medication Sig Start Date End Date Taking? Authorizing Provider  acetaminophen (TYLENOL) 325 MG tablet Take 2 tablets (650 mg total) by mouth every 6 (six) hours as needed for mild pain (or Fever >/= 101). 08/02/13  Yes Robbie Lis, MD  amLODipine (NORVASC) 2.5 MG tablet Take 2.5 mg by mouth daily.   Yes Historical Provider, MD  aspirin 81 MG chewable tablet Chew 81 mg by mouth every morning.    Yes Historical Provider, MD  hydrochlorothiazide (HYDRODIURIL) 25 MG  tablet Take 25 mg by mouth daily.    Yes Historical Provider, MD  metoprolol tartrate (LOPRESSOR) 25 MG tablet Take 1/2 tablet daily 04/26/14  Yes Troy Sine, MD  Multiple Vitamin (MULTIVITAMIN WITH MINERALS) TABS Take 1 tablet by mouth every morning.    Yes Historical Provider, MD  pyridostigmine (MESTINON) 60 MG tablet Take 1 tablet (60 mg total) by mouth 3 (three) times daily. 06/20/14  Yes Kathrynn Ducking, MD  simvastatin (ZOCOR) 5 MG tablet Take 5 mg by mouth at bedtime.   Yes Historical Provider, MD  Tamsulosin HCl (FLOMAX) 0.4 MG CAPS Take 0.4 mg by mouth at bedtime.   Yes Historical Provider, MD  Vitamin D,  Ergocalciferol, (DRISDOL) 50000 UNITS CAPS Take 50,000 Units by mouth every 7 (seven) days. On Sundays   Yes Historical Provider, MD  furosemide (LASIX) 40 MG tablet Take 40 mg by mouth every morning.    Historical Provider, MD  predniSONE (DELTASONE) 5 MG tablet TAKE 1 TABLET BY MOUTH THREE TIMES DAILY Patient not taking: Reported on 08/22/2014 06/22/14   Kathrynn Ducking, MD   BP 132/60 mmHg  Pulse 75  Temp(Src) 98 F (36.7 C) (Oral)  Resp 17  SpO2 95% Physical Exam  Constitutional: He is oriented to person, place, and time. He appears well-developed.  HENT:  Head: Normocephalic.  Eyes: Conjunctivae and EOM are normal. No scleral icterus.  Neck: Neck supple. No thyromegaly present.  Cardiovascular: Normal rate.  Exam reveals no gallop and no friction rub.   No murmur heard. Irregular heart rate  Pulmonary/Chest: No stridor. He has no wheezes. He has no rales. He exhibits no tenderness.  Abdominal: He exhibits no distension. There is no tenderness. There is no rebound.  Musculoskeletal: Normal range of motion. He exhibits no edema.  Lymphadenopathy:    He has no cervical adenopathy.  Neurological: He is oriented to person, place, and time. He exhibits normal muscle tone. Coordination normal.  Skin: No rash noted. No erythema.  Psychiatric: He has a normal mood and affect. His behavior is normal.    ED Course  Procedures (including critical care time) Labs Review Labs Reviewed  CBC WITH DIFFERENTIAL - Abnormal; Notable for the following:    WBC 12.1 (*)    RBC 2.68 (*)    Hemoglobin 7.9 (*)    HCT 24.7 (*)    RDW 17.1 (*)    Neutrophils Relative % 89 (*)    Neutro Abs 10.7 (*)    Lymphocytes Relative 3 (*)    Lymphs Abs 0.4 (*)    All other components within normal limits  COMPREHENSIVE METABOLIC PANEL - Abnormal; Notable for the following:    Glucose, Bld 134 (*)    Creatinine, Ser 1.38 (*)    Total Protein 5.5 (*)    Albumin 2.8 (*)    GFR calc non Af Amer 41 (*)     GFR calc Af Amer 47 (*)    All other components within normal limits  BRAIN NATRIURETIC PEPTIDE - Abnormal; Notable for the following:    B Natriuretic Peptide 1302.4 (*)    All other components within normal limits  CBG MONITORING, ED - Abnormal; Notable for the following:    Glucose-Capillary 141 (*)    All other components within normal limits  CULTURE, BLOOD (ROUTINE X 2)  CULTURE, BLOOD (ROUTINE X 2)  CULTURE, EXPECTORATED SPUTUM-ASSESSMENT  GRAM STAIN  TROPONIN I  URINALYSIS, ROUTINE W REFLEX MICROSCOPIC  HIV ANTIBODY (ROUTINE TESTING)  LEGIONELLA ANTIGEN, URINE  STREP PNEUMONIAE URINARY ANTIGEN    Imaging Review Ct Head Wo Contrast  08/22/2014   CLINICAL DATA:  81 year old male with syncopal episode.  Weakness.  EXAM: CT HEAD WITHOUT CONTRAST  TECHNIQUE: Contiguous axial images were obtained from the base of the skull through the vertex without intravenous contrast.  COMPARISON:  Most recent head CT 07/30/2013  FINDINGS: No acute intracranial hemorrhage. Encephalomalacia and sequela of right MCA distribution infarct, unchanged in appearance compared to prior exam. Stable atrophy and chronic small vessel ischemic change. Coarse calcifications midline frontal region and 2 cm in transverse dimension, unchanged from prior exam. No subdural fluid collection. No midline shift. The calvarium is intact. Included paranasal sinuses and mastoid air cells are well aerated. Diminished opacification of right ethmoid air cells from prior.  IMPRESSION: 1. No acute intracranial abnormality. 2. 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 Coarse calcifications in the anterior midline, unchanged from prior exam and likely calcified meningioma. 3. Stable atrophy and chronic small vessel ischemic change.   Electronically Signed   By: Jeb Levering M.D.   On: 08/22/2014 19:42   Dg Chest Port 1 View  08/22/2014   CLINICAL DATA:  Shortness of breath for 1 day  EXAM: PORTABLE CHEST - 1 VIEW  COMPARISON:   07/12/2014  FINDINGS: Focal airspace consolidation in the anterior right upper lobe. Patchy atelectasis in the infrahilar regions. Stable scarring or atelectasis in the left upper lobe extending to the suprahilar region. Heart size upper limits normal. No effusion. Visualized skeletal structures are unremarkable.  IMPRESSION: 1. New anterior right upper lobe infiltrate suggesting pneumonia   Electronically Signed   By: Arne Cleveland M.D.   On: 08/22/2014 19:46     EKG Interpretation   Date/Time:  Tuesday August 22 2014 18:10:08 EST Ventricular Rate:  109 PR Interval:  192 QRS Duration: 86 QT Interval:  357 QTC Calculation: 481 R Axis:   20 Text Interpretation:  Sinus tachycardia Ventricular bigeminy Biatrial  enlargement Abnormal R-wave progression, early transition Inferior  infarct, acute (RCA) Probable RV involvement, suggest recording right  precordial leads Confirmed by Greer Wainright  MD, Gaby Harney [phone]) on 08/22/2014  9:06:03 PM      MDM   Final diagnoses:  Weak  Community acquired pneumonia        Maudry Diego, MD 08/22/14 2107

## 2014-08-22 NOTE — ED Notes (Signed)
The patient is unable to give an urine specimen at this time. The RN is aware. 

## 2014-08-22 NOTE — Progress Notes (Signed)
ANTIBIOTIC CONSULT NOTE - INITIAL  Pharmacy Consult for vancomycin Indication: pneumonia  Allergies  Allergen Reactions  . Hydrocodone     unknown    Patient Measurements:   Adjusted Body Weight:  Vital Signs: Temp: 98 F (36.7 C) (01/05 1758) Temp Source: Oral (01/05 1758) BP: 132/60 mmHg (01/05 1900) Pulse Rate: 75 (01/05 1900)   Labs:  Recent Labs  08/22/14 1844  WBC 12.1*  HGB 7.9*  PLT 243  CREATININE 1.38*    Microbiology: No results found for this or any previous visit (from the past 720 hour(s)).  Medical History: Past Medical History  Diagnosis Date  . Hyperlipidemia   . Hypertension   . Dizziness   . Blood clot in vein   . Arthritis   . Peripheral arterial disease   . Carotid artery occlusion   . Ocular myasthenia gravis   . BPH (benign prostatic hyperplasia)   . Nephrolithiasis   . Low back pain   . Gait disorder   . Hx of radiation therapy 07/06/12, 07/08/12, 07/13/12    left upper lung nodule, 3 fractions  . Hearing difficulty   . Lumbago   . Abnormality of gait 05/09/2013  . Hearing deficit     right hearing aid  . Peripheral edema   . Lung cancer 05/2012    left    Medications:   (Not in a hospital admission)   Assessment: Admitted with SOB with exertion, concern for pneumonia, currently afebrile, mild leukocytosis, 12.1, Scr 1.38, eCrCl 30 ml/min.   Goal of Therapy:  Vancomycin trough level 15-20 mcg/ml   Plan: Cefepime 1 g IV q8h Vancomycin 1.5 g IV x1 Monitor renal fx, cultures, VT as indicated    Hughes Better, PharmD, BCPS Clinical Pharmacist Pager: [phone] 08/22/2014 9:17 PM

## 2014-08-22 NOTE — H&P (Signed)
[facility] History and Physical  Marvin [name] ZOX:[id number] DOB: August 27, 1913 DOA: 08/22/2014  Referring physician: EDP PCP: Thressa Sheller, MD   Chief Complaint: SOB   HPI: Marvin [name] is a 81 y.o. male who presents to the ED with cough, SOB, chills.  Symptoms have been going on for a few days, worse with activity, better at rest.  Cough is non-productive.  Patient complains of generalized weakness.  No chest pain.  No abdominal pain.  No headaches, no rash.  Review of Systems: Systems reviewed.  As above, otherwise negative  Past Medical History  Diagnosis Date  . Hyperlipidemia   . Hypertension   . Dizziness   . Blood clot in vein   . Arthritis   . Peripheral arterial disease   . Carotid artery occlusion   . Ocular myasthenia gravis   . BPH (benign prostatic hyperplasia)   . Nephrolithiasis   . Low back pain   . Gait disorder   . Hx of radiation therapy 07/06/12, 07/08/12, 07/13/12    left upper lung nodule, 3 fractions  . Hearing difficulty   . Lumbago   . Abnormality of gait 05/09/2013  . Hearing deficit     right hearing aid  . Peripheral edema   . Lung cancer 05/2012    left   Past Surgical History  Procedure Laterality Date  . Appendectomy    . Cataract extraction      bilateral  . Retinal detachment surgery  02/2011  . Thyroidectomy, partial    . Transthoracic echocardiogram  01/15/2011    EF=>55%, mild conc LVH; mild MR & mild mitral annular calcification; mild TR, RVSP 30-51mmHg; aortic root sclerosis/calcification  . Carotid doppler  04/2011    80-99% stenosis of right prox to mid ICA   Social History:  reports that he quit smoking about 57 years ago. His smoking use included Cigarettes. He has a 60 pack-year smoking history. He has never used smokeless tobacco. He reports that he does not drink alcohol or use illicit drugs.  Allergies  Allergen Reactions  . Hydrocodone     unknown    Family History  Problem Relation Age of Onset   . Bone cancer Mother   . Pneumonia Father   . Stroke Sister   . Hypertension Sister   . Heart disease Sister      Prior to Admission medications   Medication Sig Start Date End Date Taking? Authorizing Provider  acetaminophen (TYLENOL) 325 MG tablet Take 2 tablets (650 mg total) by mouth every 6 (six) hours as needed for mild pain (or Fever >/= 101). 08/02/13  Yes Robbie Lis, MD  aspirin 81 MG chewable tablet Chew 81 mg by mouth every morning.    Yes Historical Provider, MD  Multiple Vitamin (MULTIVITAMIN WITH MINERALS) TABS Take 1 tablet by mouth every morning.    Yes Historical Provider, MD  pyridostigmine (MESTINON) 60 MG tablet Take 1 tablet (60 mg total) by mouth 3 (three) times daily. 06/20/14  Yes Kathrynn Ducking, MD  simvastatin (ZOCOR) 5 MG tablet Take 5 mg by mouth at bedtime.   Yes Historical Provider, MD  Tamsulosin HCl (FLOMAX) 0.4 MG CAPS Take 0.4 mg by mouth at bedtime.   Yes Historical Provider, MD  Vitamin D, Ergocalciferol, (DRISDOL) 50000 UNITS CAPS Take 50,000 Units by mouth every 7 (seven) days. On Sundays   Yes Historical Provider, MD   Physical Exam: Filed Vitals:   08/22/14 1900  BP: 132/60  Pulse: 75  Temp:   Resp: 17    BP 132/60 mmHg  Pulse 75  Temp(Src) 98 F (36.7 C) (Oral)  Resp 17  SpO2 95%  General Appearance:    Alert, oriented, no distress, appears stated age  Head:    Normocephalic, atraumatic  Eyes:    PERRL, EOMI, sclera non-icteric        Nose:   Nares without drainage or epistaxis. Mucosa, turbinates normal  Throat:   Moist mucous membranes. Oropharynx without erythema or exudate.  Neck:   Supple. No carotid bruits.  No thyromegaly.  No lymphadenopathy.   Back:     No CVA tenderness, no spinal tenderness  Lungs:     Clear to auscultation bilaterally, without wheezes, rhonchi or rales  Chest wall:    No tenderness to palpitation  Heart:    Irregularly irregular, without murmurs, gallops, rubs  Abdomen:     Soft, non-tender,  nondistended, normal bowel sounds, no organomegaly  Genitalia:    deferred  Rectal:    deferred  Extremities:   No clubbing, cyanosis or edema.  Pulses:   2+ and symmetric all extremities  Skin:   Skin color, texture, turgor normal, no rashes or lesions  Lymph nodes:   Cervical, supraclavicular, and axillary nodes normal  Neurologic:   CNII-XII intact. Normal strength, sensation and reflexes      throughout    Labs on Admission:  Basic Metabolic Panel:  Recent Labs Lab 08/22/14 1844  NA 136  K 4.5  CL 104  CO2 22  GLUCOSE 134*  BUN 23  CREATININE 1.38*  CALCIUM 8.5   Liver Function Tests:  Recent Labs Lab 08/22/14 1844  AST 36  ALT 31  ALKPHOS 106  BILITOT 1.2  PROT 5.5*  ALBUMIN 2.8*   No results for input(s): LIPASE, AMYLASE in the last 168 hours. No results for input(s): AMMONIA in the last 168 hours. CBC:  Recent Labs Lab 08/22/14 1844  WBC 12.1*  NEUTROABS 10.7*  HGB 7.9*  HCT 24.7*  MCV 92.2  PLT 243   Cardiac Enzymes:  Recent Labs Lab 08/22/14 1844  TROPONINI 0.03    BNP (last 3 results) No results for input(s): PROBNP in the last 8760 hours. CBG:  Recent Labs Lab 08/22/14 1820  GLUCAP 141*    Radiological Exams on Admission: Ct Head Wo Contrast  08/22/2014   CLINICAL DATA:  81 year old male with syncopal episode.  Weakness.  EXAM: CT HEAD WITHOUT CONTRAST  TECHNIQUE: Contiguous axial images were obtained from the base of the skull through the vertex without intravenous contrast.  COMPARISON:  Most recent head CT 07/30/2013  FINDINGS: No acute intracranial hemorrhage. Encephalomalacia and sequela of right MCA distribution infarct, unchanged in appearance compared to prior exam. Stable atrophy and chronic small vessel ischemic change. Coarse calcifications midline frontal region and 2 cm in transverse dimension, unchanged from prior exam. No subdural fluid collection. No midline shift. The calvarium is intact. Included paranasal sinuses and  mastoid air cells are well aerated. Diminished opacification of right ethmoid air cells from prior.  IMPRESSION: 1. No acute intracranial abnormality. 2. 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 Coarse calcifications in the anterior midline, unchanged from prior exam and likely calcified meningioma. 3. Stable atrophy and chronic small vessel ischemic change.   Electronically Signed   By: Jeb Levering M.D.   On: 08/22/2014 19:42   Dg Chest Port 1 View  08/22/2014   CLINICAL DATA:  Shortness of breath for 1 day  EXAM: PORTABLE CHEST - 1 VIEW  COMPARISON:  07/12/2014  FINDINGS: Focal airspace consolidation in the anterior right upper lobe. Patchy atelectasis in the infrahilar regions. Stable scarring or atelectasis in the left upper lobe extending to the suprahilar region. Heart size upper limits normal. No effusion. Visualized skeletal structures are unremarkable.  IMPRESSION: 1. New anterior right upper lobe infiltrate suggesting pneumonia   Electronically Signed   By: Arne Cleveland M.D.   On: 08/22/2014 19:46    EKG: Independently reviewed.  Assessment/Plan Principal Problem:   HCAP (healthcare-associated pneumonia) Active Problems:   Lung cancer, upper lobe   Myasthenia gravis   1. HCAP - RUL PNA on CXR.  given recent admit at the end of November, PNA on CXR represents HCAP. 1. Cefepime and vanc 2. Cultures pending 2. Myasthenia gravis - continue pyridostigmine and TID prednisone 3. HTN - patient has been taken off of all BP meds as a quality of life' issue.  Despite this, his BP is actually quite reasonable today at 132/60 in the ED with SBPs ranging from the 120s to 140s. 4. LUL lung CA - s/p radiation therapy, no known recurrence, if patients HCAP does not respond to ABX treatment then may wish to proceed to CT scan for diagnosis.    Code Status: DNR  Family Communication: Son at bedside, daughter is on way to hospital tomorrow. Disposition Plan: Admit to inpatient    Time spent: 70 min  Marvin [name] M. [facility] Pager [phone]  If 7AM-7PM, please contact the day team taking care of the patient [email or web address] Password [facility] 08/22/2014, 9:28 PM

## 2014-08-22 NOTE — ED Notes (Addendum)
Per EMS- pt had an epidsoder of SOB with exertion pt was walking to dinner at [employer] at [place]. Reports that he "almost passed out". Denies associated symptoms or pain. Denies SOB at this time as well.

## 2014-08-22 NOTE — ED Notes (Signed)
Transporting patient to new room assignment. 

## 2014-08-23 DIAGNOSIS — E440 Moderate protein-calorie malnutrition: Secondary | ICD-10-CM | POA: Insufficient documentation

## 2014-08-23 LAB — RETICULOCYTES
RBC.: 2.63 MIL/uL — ABNORMAL LOW (ref 4.22–5.81)
RETIC CT PCT: 3.1 % (ref 0.4–3.1)
Retic Count, Absolute: 81.5 10*3/uL (ref 19.0–186.0)

## 2014-08-23 LAB — URINALYSIS, ROUTINE W REFLEX MICROSCOPIC
Bilirubin Urine: NEGATIVE
Glucose, UA: NEGATIVE mg/dL
HGB URINE DIPSTICK: NEGATIVE
Ketones, ur: 15 mg/dL — AB
Leukocytes, UA: NEGATIVE
Nitrite: NEGATIVE
PROTEIN: NEGATIVE mg/dL
Specific Gravity, Urine: 1.019 (ref 1.005–1.030)
UROBILINOGEN UA: 0.2 mg/dL (ref 0.0–1.0)
pH: 5 (ref 5.0–8.0)

## 2014-08-23 LAB — BASIC METABOLIC PANEL
Anion gap: 8 (ref 5–15)
BUN: 21 mg/dL (ref 6–23)
CO2: 23 mmol/L (ref 19–32)
Calcium: 8.1 mg/dL — ABNORMAL LOW (ref 8.4–10.5)
Chloride: 106 mEq/L (ref 96–112)
Creatinine, Ser: 1.31 mg/dL (ref 0.50–1.35)
GFR calc Af Amer: 50 mL/min — ABNORMAL LOW (ref >90)
GFR, EST NON AFRICAN AMERICAN: 44 mL/min — AB (ref >90)
GLUCOSE: 105 mg/dL — AB (ref 70–99)
Potassium: 4.1 mmol/L (ref 3.5–5.1)
SODIUM: 137 mmol/L (ref 135–145)

## 2014-08-23 LAB — CBC
HCT: 23.6 % — ABNORMAL LOW (ref 39.0–52.0)
HEMOGLOBIN: 7.6 g/dL — AB (ref 13.0–17.0)
MCH: 30.2 pg (ref 26.0–34.0)
MCHC: 32.2 g/dL (ref 30.0–36.0)
MCV: 93.7 fL (ref 78.0–100.0)
Platelets: 214 10*3/uL (ref 150–400)
RBC: 2.52 MIL/uL — ABNORMAL LOW (ref 4.22–5.81)
RDW: 16.9 % — ABNORMAL HIGH (ref 11.5–15.5)
WBC: 9.2 10*3/uL (ref 4.0–10.5)

## 2014-08-23 LAB — STREP PNEUMONIAE URINARY ANTIGEN: Strep Pneumo Urinary Antigen: NEGATIVE

## 2014-08-23 LAB — PREPARE RBC (CROSSMATCH)

## 2014-08-23 LAB — ABO/RH: ABO/RH(D): B POS

## 2014-08-23 MED ORDER — FUROSEMIDE 10 MG/ML IJ SOLN
20.0000 mg | Freq: Once | INTRAMUSCULAR | Status: AC
  Administered 2014-08-24: 20 mg via INTRAVENOUS
  Filled 2014-08-23: qty 2

## 2014-08-23 MED ORDER — DIPHENHYDRAMINE HCL 50 MG/ML IJ SOLN
25.0000 mg | Freq: Once | INTRAMUSCULAR | Status: AC
  Administered 2014-08-23: 25 mg via INTRAVENOUS
  Filled 2014-08-23: qty 1

## 2014-08-23 MED ORDER — VANCOMYCIN HCL IN DEXTROSE 1-5 GM/200ML-% IV SOLN
1000.0000 mg | INTRAVENOUS | Status: DC
  Administered 2014-08-23 – 2014-08-26 (×4): 1000 mg via INTRAVENOUS
  Filled 2014-08-23 (×5): qty 200

## 2014-08-23 MED ORDER — ACETAMINOPHEN 325 MG PO TABS
650.0000 mg | ORAL_TABLET | Freq: Once | ORAL | Status: AC
  Administered 2014-08-23: 650 mg via ORAL
  Filled 2014-08-23: qty 2

## 2014-08-23 MED ORDER — SODIUM CHLORIDE 0.9 % IV SOLN: Freq: Once | INTRAVENOUS | Status: DC

## 2014-08-23 MED ORDER — CEFEPIME HCL 1 G IJ SOLR
1.0000 g | INTRAMUSCULAR | Status: DC
  Administered 2014-08-24 – 2014-08-28 (×5): 1 g via INTRAVENOUS
  Filled 2014-08-23 (×6): qty 1

## 2014-08-23 NOTE — Evaluation (Signed)
Clinical/Bedside Swallow Evaluation Patient Details  Name: Marvin [name] MRN: [id number] Date of Birth: 01-25-1914  Today's Date: 08/23/2014 Time: 0900-0943 SLP Time Calculation (min) (ACUTE ONLY): 43 min  Past Medical History:  Past Medical History  Diagnosis Date  . Hyperlipidemia   . Hypertension   . Dizziness   . Blood clot in vein   . Arthritis   . Peripheral arterial disease   . Carotid artery occlusion   . Ocular myasthenia gravis   . BPH (benign prostatic hyperplasia)   . Nephrolithiasis   . Low back pain   . Gait disorder   . Hx of radiation therapy 07/06/12, 07/08/12, 07/13/12    left upper lung nodule, 3 fractions  . Hearing difficulty   . Lumbago   . Abnormality of gait 05/09/2013  . Hearing deficit     right hearing aid  . Peripheral edema   . Lung cancer 05/2012    left   Past Surgical History:  Past Surgical History  Procedure Laterality Date  . Appendectomy    . Cataract extraction      bilateral  . Retinal detachment surgery  02/2011  . Thyroidectomy, partial    . Transthoracic echocardiogram  01/15/2011    EF=>55%, mild conc LVH; mild MR & mild mitral annular calcification; mild TR, RVSP 30-43mmHg; aortic root sclerosis/calcification  . Carotid doppler  04/2011    80-99% stenosis of right prox to mid ICA   HPI:      Assessment / Plan / Recommendation Clinical Impression  Pt presents with functional oropharyngeal swallow ability without CN deficits impacting swallow musculature nor s/s of aspiration.  Swallow was timely without indications of pharygneal stasis.    Please note, pt reported to neuro MD in June that he had difficulties with globus sensation that cleared with throat clear - unrelated to po intake.    SLP educated pt to importance of taking rest breaks when dyspenic due to concern for episodic aspiration.   Spoke re: option to pursue MBS if he desired to assure no overt aspiration, to which he declined and SLP agrees.  Pt also  denies dysphagia during his radiation treatment for lung cancer.  Will sign off as all education completed.  Thanks for this order.     Aspiration Risk  Moderate    Diet Recommendation Regular;Thin liquid (pt aware of dysphagia and encouraged to order soft foods)   Liquid Administration via: Cup;Straw Medication Administration: Whole meds with liquid Supervision: Patient able to self feed Compensations: Slow rate;Small sips/bites Postural Changes and/or Swallow Maneuvers: Seated upright 90 degrees;Upright 30-60 min after meal    Other  Recommendations Oral Care Recommendations: Oral care BID   Follow Up Recommendations  None    Frequency and Duration   n/a     Pertinent Vitals/Pain Afebrile, decreased     Swallow Study Prior Functional Status   poor appetite x few weeks, pt admits he was not feeling well    General Date of Onset: 08/23/14 Type of Study: Bedside swallow evaluation Diet Prior to this Study: Regular;Thin liquids Temperature Spikes Noted: No Respiratory Status: Room air History of Recent Intubation: No Oral Cavity - Dentition: Adequate natural dentition (few missing upper dentition) Self-Feeding Abilities: Able to feed self Patient Positioning: Upright in bed Baseline Vocal Quality: Clear Volitional Swallow: Able to elicit    Oral/Motor/Sensory Function Overall Oral Motor/Sensory Function: Appears within functional limits for tasks assessed   Ice Chips Ice chips: Not tested   Thin Liquid Thin  Liquid: Within functional limits Presentation: Straw;Self Fed    Nectar Thick Nectar Thick Liquid: Not tested   Honey Thick Honey Thick Liquid: Not tested   Puree Puree: Within functional limits Presentation: Self Fed;Spoon   Solid   GO    Solid: Within functional limits Presentation: [place], [place], [place] [facility] SLP [phone]

## 2014-08-23 NOTE — Progress Notes (Signed)
PATIENT DETAILS Name: Marvin [name] Age: 81 y.o. Sex: male Date of Birth: July 29, 1914 Admit Date: 08/22/2014 Admitting Physician Etta Quill, DO QQV:[id number],PPIRJ, MD  Subjective: Main issue seems to be shortness of breath-that is mostly on exertion-even with minimal exertion  Assessment/Plan: Principal Problem:   HCAP (healthcare-associated pneumonia): Suspect some aspiration component given history of myasthenia gravis. Continue with vancomycin and cefepime. Blood cultures on 1/5 pending, urine streptococcal antigen negative, urine Legionella antigen pending. HIV antibody pending.  Active Problems:   Shortness of breath: Seems disproportionate to the amount of infiltrate seen on chest x-ray. Appears significantly more anemic than baseline. Suspect exertional shortness of breath due to the combination of pneumonia and anemia. Will continue antibiotics, transfuse 1 unit of PRBC and reassess. Do not think that further workup including CTA chest, echocardiogram would change overall management, as patient appears very frail and has had history of falls recently.Marland Kitchen Spoke with patient, family friend at bedside who agree with this approach. Await arrival of patient's daughter later this afternoon.   Subacute blood loss Anemia: Appears to have chronic anemia-? Secondary to chronic disease. Further worsening of hemoglobin probably secondary to recent GI bleed-patient endorses intermittent hematochezia and melena. Rectal exam done by this M.D., stools brown in color but weakly guaiac positive. Check anemia panel, not a candidate for endoscopic procedures. Transfuse 1 unit of PRBC, follow CBC.   Myasthenia gravis: Continue Mestinon and prednisone   BPH: Continue Flomax   Chronic kidney disease stage III: Creatinine close to usual baseline   Hypertension: Recently patient off all antihypertensive medications by PCP. BP appears controlled. Follow.   Malnutrition of moderate degree:  Continue supplements   Failure to thrive syndrome: DO NOT RESUSCITATE. Nutrition/PT eval pain. Disposition not clear at this time.  Disposition: Remain inpatient  Antibiotics:  See Below   Anti-infectives    Start     Dose/Rate Route Frequency Ordered Stop   08/24/14 0600  ceFEPIme (MAXIPIME) 1 g in dextrose 5 % 50 mL IVPB     1 g100 mL/hr over 30 Minutes Intravenous Every 24 hours 08/23/14 1039     08/23/14 2200  vancomycin (VANCOCIN) IVPB 1000 mg/200 mL premix     1,000 mg200 mL/hr over 60 Minutes Intravenous Every 24 hours 08/23/14 1047     08/22/14 2300  ceFEPIme (MAXIPIME) 1 g in dextrose 5 % 50 mL IVPB  Status:  Discontinued     1 g100 mL/hr over 30 Minutes Intravenous 3 times per day 08/22/14 2221 08/23/14 1039   08/22/14 2200  ceFEPIme (MAXIPIME) 1 g in dextrose 5 % 50 mL IVPB  Status:  Discontinued     1 g100 mL/hr over 30 Minutes Intravenous 3 times per day 08/22/14 2103 08/22/14 2221   08/22/14 2115  vancomycin (VANCOCIN) 1,500 mg in sodium chloride 0.9 % 500 mL IVPB     1,500 mg250 mL/hr over 120 Minutes Intravenous  Once 08/22/14 2114 08/22/14 2331   08/22/14 2100  levofloxacin (LEVAQUIN) IVPB 500 mg  Status:  Discontinued     500 mg100 mL/hr over 60 Minutes Intravenous  Once 08/22/14 2058 08/22/14 2103      DVT Prophylaxis: Prophylactic Heparin  Code Status: Full code   Family Communication Family friend at bedside, Daughter over the phone  Procedures:  None  CONSULTS:  None  Time spent 40 minutes-which includes 50% of the time with face-to-face with patient/ family and coordinating care related to the above assessment and  plan.  MEDICATIONS: Scheduled Meds: . acetaminophen  650 mg Oral Once  . aspirin  81 mg Oral BH-q7a  . [START ON 08/24/2014] ceFEPime (MAXIPIME) IV  1 g Intravenous Q24H  . diphenhydrAMINE  25 mg Intravenous Once  . feeding supplement (ENSURE COMPLETE)  237 mL Oral BID BM  . furosemide  20 mg Intravenous Once  . heparin  5,000  Units Subcutaneous 3 times per day  . predniSONE  5 mg Oral TID  . pyridostigmine  60 mg Oral TID  . simvastatin  5 mg Oral QHS  . tamsulosin  0.4 mg Oral QHS  . vancomycin  1,000 mg Intravenous Q24H   Continuous Infusions:  PRN Meds:.acetaminophen    PHYSICAL EXAM: Vital signs in last 24 hours: Filed Vitals:   08/22/14 1900 08/22/14 2132 08/22/14 2233 08/23/14 0608  BP: 132/60 149/54 162/58 145/75  Pulse: 75 48 95 89  Temp:   98.3 F (36.8 C) 97.9 F (36.6 C)  TempSrc:   Oral   Resp: 17 21 20 19   Height:   5\' 7"  (1.702 m)   Weight:   74.526 kg (164 lb 4.8 oz)   SpO2: 95% 93% 95% 95%    Weight change:  Filed Weights   08/22/14 2233  Weight: 74.526 kg (164 lb 4.8 oz)   Body mass index is 25.73 kg/(m^2).   Gen Exam: Awake and alert with clear speech.  Hard of hearing. Neck: Supple, No JVD.   Chest: B/L Clear.   CVS: S1 S2 Regular, no murmurs.  Abdomen: soft, BS +, non tender, non distended.  Extremities: no edema, lower extremities warm to touch. Neurologic: Non Focal.   Skin: No Rash.   Wounds: N/A.   Intake/Output from previous day:  Intake/Output Summary (Last 24 hours) at 08/23/14 1358 Last data filed at 08/23/14 0930  Gross per 24 hour  Intake    120 ml  Output    450 ml  Net   -330 ml     LAB RESULTS: CBC  Recent Labs Lab 08/22/14 1844 08/23/14 0806  WBC 12.1* 9.2  HGB 7.9* 7.6*  HCT 24.7* 23.6*  PLT 243 214  MCV 92.2 93.7  MCH 29.5 30.2  MCHC 32.0 32.2  RDW 17.1* 16.9*  LYMPHSABS 0.4*  --   MONOABS 1.0  --   EOSABS 0.0  --   BASOSABS 0.0  --     Chemistries   Recent Labs Lab 08/22/14 1844 08/23/14 0806  NA 136 137  K 4.5 4.1  CL 104 106  CO2 22 23  GLUCOSE 134* 105*  BUN 23 21  CREATININE 1.38* 1.31  CALCIUM 8.5 8.1*    CBG:  Recent Labs Lab 08/22/14 1820  GLUCAP 141*    GFR Estimated Creatinine Clearance: 29.4 mL/min (by C-G formula based on Cr of 1.31).  Coagulation profile No results for input(s): INR,  PROTIME in the last 168 hours.  Cardiac Enzymes  Recent Labs Lab 08/22/14 1844  TROPONINI 0.03    Invalid input(s): POCBNP No results for input(s): DDIMER in the last 72 hours. No results for input(s): HGBA1C in the last 72 hours. No results for input(s): CHOL, HDL, LDLCALC, TRIG, CHOLHDL, LDLDIRECT in the last 72 hours. No results for input(s): TSH, T4TOTAL, T3FREE, THYROIDAB in the last 72 hours.  Invalid input(s): FREET3 No results for input(s): VITAMINB12, FOLATE, FERRITIN, TIBC, IRON, RETICCTPCT in the last 72 hours. No results for input(s): LIPASE, AMYLASE in the last 72 hours.  Urine Studies  No results for input(s): UHGB, CRYS in the last 72 hours.  Invalid input(s): UACOL, UAPR, USPG, UPH, UTP, UGL, UKET, UBIL, UNIT, UROB, ULEU, UEPI, UWBC, URBC, UBAC, CAST, UCOM, BILUA  MICROBIOLOGY: No results found for this or any previous visit (from the past 240 hour(s)).  RADIOLOGY STUDIES/RESULTS: Ct Head Wo Contrast  08/22/2014   CLINICAL DATA:  81 year old male with syncopal episode.  Weakness.  EXAM: CT HEAD WITHOUT CONTRAST  TECHNIQUE: Contiguous axial images were obtained from the base of the skull through the vertex without intravenous contrast.  COMPARISON:  Most recent head CT 07/30/2013  FINDINGS: No acute intracranial hemorrhage. Encephalomalacia and sequela of right MCA distribution infarct, unchanged in appearance compared to prior exam. Stable atrophy and chronic small vessel ischemic change. Coarse calcifications midline frontal region and 2 cm in transverse dimension, unchanged from prior exam. No subdural fluid collection. No midline shift. The calvarium is intact. Included paranasal sinuses and mastoid air cells are well aerated. Diminished opacification of right ethmoid air cells from prior.  IMPRESSION: 1. No acute intracranial abnormality. 2. 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 Coarse calcifications in the anterior midline, unchanged from prior exam and  likely calcified meningioma. 3. Stable atrophy and chronic small vessel ischemic change.   Electronically Signed   By: Jeb Levering M.D.   On: 08/22/2014 19:42   Dg Chest Port 1 View  08/22/2014   CLINICAL DATA:  Shortness of breath for 1 day  EXAM: PORTABLE CHEST - 1 VIEW  COMPARISON:  07/12/2014  FINDINGS: Focal airspace consolidation in the anterior right upper lobe. Patchy atelectasis in the infrahilar regions. Stable scarring or atelectasis in the left upper lobe extending to the suprahilar region. Heart size upper limits normal. No effusion. Visualized skeletal structures are unremarkable.  IMPRESSION: 1. New anterior right upper lobe infiltrate suggesting pneumonia   Electronically Signed   By: Arne Cleveland M.D.   On: 08/22/2014 19:46    Oren Binet, MD  [facility] Pager:[phone]  If 7PM-7AM, please contact night-coverage [email or web address] Password [email or web address] 08/23/2014, 1:58 PM   LOS: 1 day

## 2014-08-23 NOTE — Progress Notes (Addendum)
INITIAL NUTRITION ASSESSMENT  DOCUMENTATION CODES Per approved criteria  -Non-severe (moderate) malnutrition in the context of chronic illness   Pt meets criteria for moderate MALNUTRITION in the context of chronic illness as evidenced by mild fat and muscle depletion, 7.8% wt loss x 3 months.  INTERVENTION: -Continue Ensure Complete po BID, each supplement provides 350 kcal and 13 grams of protein  NUTRITION DIAGNOSIS: Inadequate oral intake related to decreased appetite as evidenced by PO: 25%, 7.8% wt loss x 3 months..   Goal: Pt will meet >90% of estimated nutritional needs  Monitor:  PO/supplement intake, labs, weight changes, I/O's  Reason for Assessment: MST=3  81 y.o. male  Admitting Dx: HCAP (healthcare-associated pneumonia)  Marvin [name] is a 81 y.o. male who presents to the ED with cough, SOB, chills. Symptoms have been going on for a few days, worse with activity, better at rest. Cough is non-productive. Patient complains of generalized weakness. No chest pain. No abdominal pain. No headaches, no rash.  ASSESSMENT: Pt admitted with HCAP. He is a resident of [place] at [facility]. He is fairly independent at baseline and uses a rolling walker to ambulate.  Hx obtained by pt and close friend at bedside. Noted pt is very HOH, however, can hear you if you speak clearly in his left ear. Both confirm hx of weight loss and poor appetite PTA. Pt reports poor appetite for the past 2 weeks. He reports he knows he has lost weight, but is unsure of how much; he believe weight loss started 2-3 weeks ago along with symptom of poor appetite. Both confirm UBW of 180#. Documented wt hx reveals a 14# (7.8%) wt loss over the past 3 months.  SLP evaluated and BSE revealed no deficits or signs of aspiration- recommended a regular consistency diet with thin liquids. Pt confirms no concerns with swallowing. He reports he ate very little today, due to not having an appetite (PO:  25%). Ensure Complete at bedside table was 75% complete at visit. Pt reports he really enjoys them and is agreeable to continuing supplements. Educated pt on importance of good PO intake via meals and supplements to promote healing process.  Labs reviewed. Calcium: 8.1, Glucose: 105, CBGS: 141.   Nutrition Focused Physical Exam:  Subcutaneous Fat:  Orbital Region: mild depletion Upper Arm Region: mild depletion Thoracic and Lumbar Region: WDL  Muscle:  Temple Region: mild depletion Clavicle Bone Region: mild depletion Clavicle and Acromion Bone Region: mild depletion Scapular Bone Region: mild depletion Dorsal Hand: WDL Patellar Region: mild depletion Anterior Thigh Region: mild depletion Posterior Calf Region: mild depletion  Edema: none present  Height: Ht Readings from Last 1 Encounters:  08/22/14 5\' 7"  (1.702 m)    Weight: Wt Readings from Last 1 Encounters:  08/22/14 164 lb 4.8 oz (74.526 kg)    Ideal Body Weight: 148#  % Ideal Body Weight: 111%  Wt Readings from Last 10 Encounters:  08/22/14 164 lb 4.8 oz (74.526 kg)  05/17/14 178 lb (80.74 kg)  04/27/14 185 lb 12.8 oz (84.278 kg)  04/12/14 186 lb (84.369 kg)  03/31/14 179 lb 4.8 oz (81.33 kg)  02/16/14 181 lb (82.101 kg)  02/07/14 181 lb 4.8 oz (82.237 kg)  01/30/14 180 lb (81.647 kg)  10/24/13 185 lb 6.4 oz (84.097 kg)  07/30/13 182 lb 1.6 oz (82.6 kg)    Usual Body Weight: 182#  % Usual Body Weight: 90%  BMI:  Body mass index is 25.73 kg/(m^2). Overweight  Estimated Nutritional  Needs: Kcal: 1800-2000 Protein: 85-95 grams Fluid: 1.8-2.0 L  Skin: WDL  Diet Order: Diet Heart  EDUCATION NEEDS: -Education needs addressed   Intake/Output Summary (Last 24 hours) at 08/23/14 1107 Last data filed at 08/23/14 0930  Gross per 24 hour  Intake    120 ml  Output    450 ml  Net   -330 ml    Last BM: PTA  Labs:   Recent Labs Lab 08/22/14 1844 08/23/14 0806  NA 136 137  K 4.5 4.1  CL 104  106  CO2 22 23  BUN 23 21  CREATININE 1.38* 1.31  CALCIUM 8.5 8.1*  GLUCOSE 134* 105*    CBG (last 3)   Recent Labs  08/22/14 1820  GLUCAP 141*    Scheduled Meds: . aspirin  81 mg Oral BH-q7a  . [START ON 08/24/2014] ceFEPime (MAXIPIME) IV  1 g Intravenous Q24H  . feeding supplement (ENSURE COMPLETE)  237 mL Oral BID BM  . heparin  5,000 Units Subcutaneous 3 times per day  . predniSONE  5 mg Oral TID  . pyridostigmine  60 mg Oral TID  . simvastatin  5 mg Oral QHS  . tamsulosin  0.4 mg Oral QHS  . vancomycin  1,000 mg Intravenous Q24H    Continuous Infusions:   Past Medical History  Diagnosis Date  . Hyperlipidemia   . Hypertension   . Dizziness   . Blood clot in vein   . Arthritis   . Peripheral arterial disease   . Carotid artery occlusion   . Ocular myasthenia gravis   . BPH (benign prostatic hyperplasia)   . Nephrolithiasis   . Low back pain   . Gait disorder   . Hx of radiation therapy 07/06/12, 07/08/12, 07/13/12    left upper lung nodule, 3 fractions  . Hearing difficulty   . Lumbago   . Abnormality of gait 05/09/2013  . Hearing deficit     right hearing aid  . Peripheral edema   . Lung cancer 05/2012    left    Past Surgical History  Procedure Laterality Date  . Appendectomy    . Cataract extraction      bilateral  . Retinal detachment surgery  02/2011  . Thyroidectomy, partial    . Transthoracic echocardiogram  01/15/2011    EF=>55%, mild conc LVH; mild MR & mild mitral annular calcification; mild TR, RVSP 30-70mmHg; aortic root sclerosis/calcification  . Carotid doppler  04/2011    80-99% stenosis of right prox to mid ICA    Maximillion Gill A. Jimmye Norman, RD, LDN, CDE Pager: [phone] After hours Pager: [phone]

## 2014-08-23 NOTE — Progress Notes (Signed)
Dear Doctor: This patient has been identified as a candidate for PICC for the following reason (s): poor veins/poor circulatory system (CHF, COPD, emphysema, diabetes, steroid use, IV drug abuse, etc.) If you agree, please write an order for the indicated device. For any questions contact the Vascular Access Team at [phone] if no answer, please leave a message.  Thank you for supporting the early vascular access assessment program.

## 2014-08-24 ENCOUNTER — Inpatient Hospital Stay (HOSPITAL_COMMUNITY): Payer: Medicare Other

## 2014-08-24 DIAGNOSIS — S42201A Unspecified fracture of upper end of right humerus, initial encounter for closed fracture: Secondary | ICD-10-CM

## 2014-08-24 LAB — TYPE AND SCREEN
ABO/RH(D): B POS
Antibody Screen: NEGATIVE
Unit division: 0

## 2014-08-24 LAB — CBC
HEMATOCRIT: 26.7 % — AB (ref 39.0–52.0)
HEMOGLOBIN: 8.7 g/dL — AB (ref 13.0–17.0)
MCH: 29.2 pg (ref 26.0–34.0)
MCHC: 32.6 g/dL (ref 30.0–36.0)
MCV: 89.6 fL (ref 78.0–100.0)
Platelets: 239 10*3/uL (ref 150–400)
RBC: 2.98 MIL/uL — ABNORMAL LOW (ref 4.22–5.81)
RDW: 18 % — ABNORMAL HIGH (ref 11.5–15.5)
WBC: 8 10*3/uL (ref 4.0–10.5)

## 2014-08-24 LAB — LEGIONELLA ANTIGEN, URINE

## 2014-08-24 LAB — IRON AND TIBC
IRON: 13 ug/dL — AB (ref 42–165)
Saturation Ratios: 7 % — ABNORMAL LOW (ref 20–55)
TIBC: 195 ug/dL — ABNORMAL LOW (ref 215–435)
UIBC: 182 ug/dL (ref 125–400)

## 2014-08-24 LAB — FERRITIN: FERRITIN: 394 ng/mL — AB (ref 22–322)

## 2014-08-24 LAB — VITAMIN B12: Vitamin B-12: 621 pg/mL (ref 211–911)

## 2014-08-24 LAB — FOLATE: Folate: >20 ng/mL

## 2014-08-24 MED ORDER — TRAMADOL HCL 50 MG PO TABS
50.0000 mg | ORAL_TABLET | Freq: Two times a day (BID) | ORAL | Status: DC | PRN
  Administered 2014-08-24 – 2014-08-26 (×3): 50 mg via ORAL
  Filled 2014-08-24 (×3): qty 1

## 2014-08-24 MED ORDER — MORPHINE SULFATE 2 MG/ML IJ SOLN
1.0000 mg | INTRAMUSCULAR | Status: DC | PRN
  Administered 2014-08-24 – 2014-08-26 (×2): 1 mg via INTRAVENOUS
  Filled 2014-08-24 (×2): qty 1

## 2014-08-24 MED ORDER — MORPHINE SULFATE (CONCENTRATE) 10 MG/0.5ML PO SOLN
5.0000 mg | ORAL | Status: DC | PRN
  Administered 2014-08-24: 5 mg via ORAL
  Filled 2014-08-24: qty 0.5

## 2014-08-24 NOTE — Care Management Note (Addendum)
  Page 1 of 1   08/25/2014     8:29:29 AM CARE MANAGEMENT NOTE 08/25/2014  Patient:  Marvin [name], Marvin [name]   Account Number:  [id number]  Date Initiated:  08/24/2014  Documentation initiated by:  Magdalen Spatz  Subjective/Objective Assessment:     Action/Plan:   Anticipated DC Date:  08/26/2014   Anticipated DC Plan:  [place]  In-house referral  Clinical Social Worker         Choice offered to / List presented to:             Status of service:   Medicare Important Message given?  YES (If response is "NO", the following Medicare IM given date fields will be blank) Date Medicare IM given:  08/24/2014 Medicare IM given by:  Magdalen Spatz Date Additional Medicare IM given:  08/28/2014 Additional Medicare IM given by:  Magdalen Spatz  Discharge Disposition:    Per UR Regulation:  Reviewed for med. necessity/level of care/duration of stay  If discussed at [place] of Stay Meetings, dates discussed:    Comments:

## 2014-08-24 NOTE — Progress Notes (Signed)
Orthopedic Tech Progress Note Patient Details:  Marvin [name] 27-Jul-1914 [id number] [employer] Type of Ortho Device: Arm sling Ortho Device/Splint Location: RUE Ortho Device/Splint Interventions: Application   Asia R Thompson 08/24/2014, 2:28 PM

## 2014-08-24 NOTE — Progress Notes (Signed)
Orthopedic Tech Progress Note Patient Details:  Marvin [name] [birth year] [id number] [employer] contacted for brace order Patient ID: [name], male   DOB: 1913/08/30, 81 y.o.   MRN: [id number]   [name] 08/24/2014, 12:09 PM

## 2014-08-24 NOTE — Consult Note (Signed)
ORTHOPAEDIC CONSULTATION  REQUESTING PHYSICIAN: Jonetta Osgood, MD  Chief Complaint: right humerus fracture  HPI: Marvin [name] is a 81 y.o. male who was being helped to the bathroom when he felt a pop and pain in his R arm. He has a history of lung cancer treated 65yr ago.   Past Medical History  Diagnosis Date  . Hyperlipidemia   . Hypertension   . Dizziness   . Blood clot in vein   . Arthritis   . Peripheral arterial disease   . Carotid artery occlusion   . Ocular myasthenia gravis   . BPH (benign prostatic hyperplasia)   . Nephrolithiasis   . Low back pain   . Gait disorder   . Hx of radiation therapy 07/06/12, 07/08/12, 07/13/12    left upper lung nodule, 3 fractions  . Hearing difficulty   . Lumbago   . Abnormality of gait 05/09/2013  . Hearing deficit     right hearing aid  . Peripheral edema   . Lung cancer 05/2012    left   Past Surgical History  Procedure Laterality Date  . Appendectomy    . Cataract extraction      bilateral  . Retinal detachment surgery  02/2011  . Thyroidectomy, partial    . Transthoracic echocardiogram  01/15/2011    EF=>55%, mild conc LVH; mild MR & mild mitral annular calcification; mild TR, RVSP 30-473mg; aortic root sclerosis/calcification  . Carotid doppler  04/2011    80-99% stenosis of right prox to mid ICA   History   Social History  . Marital Status: Widowed    Spouse Name: N/A    Number of Children: 3  . Years of Education: HS   Occupational History  .      Retired   Social History Main Topics  . Smoking status: Former Smoker -- 1.50 packs/day for 40 years    Types: Cigarettes    Quit date: 03/27/1957  . Smokeless tobacco: Never Used     Comment: Quit in 1958  . Alcohol Use: No  . Drug Use: No  . Sexual Activity: None   Other Topics Concern  . None   Social History Narrative   Patient lives [facility]   Caffeine Use- Does consume   Patient is retired.   Patient has some college  education.   Family History  Problem Relation Age of Onset  . Bone cancer Mother   . Pneumonia Father   . Stroke Sister   . Hypertension Sister   . Heart disease Sister    Allergies  Allergen Reactions  . Hydrocodone     unknown   Prior to Admission medications   Medication Sig Start Date End Date Taking? Authorizing Provider  acetaminophen (TYLENOL) 325 MG tablet Take 2 tablets (650 mg total) by mouth every 6 (six) hours as needed for mild pain (or Fever >/= 101). 08/02/13  Yes AlRobbie LisMD  aspirin 81 MG chewable tablet Chew 81 mg by mouth every morning.    Yes Historical Provider, MD  Multiple Vitamin (MULTIVITAMIN WITH MINERALS) TABS Take 1 tablet by mouth every morning.    Yes Historical Provider, MD  pyridostigmine (MESTINON) 60 MG tablet Take 1 tablet (60 mg total) by mouth 3 (three) times daily. 06/20/14  Yes ChKathrynn DuckingMD  simvastatin (ZOCOR) 5 MG tablet Take 5 mg by mouth at bedtime.   Yes Historical Provider, MD  Tamsulosin HCl (FLOMAX) 0.4 MG  CAPS Take 0.4 mg by mouth at bedtime.   Yes Historical Provider, MD  Vitamin D, Ergocalciferol, (DRISDOL) 50000 UNITS CAPS Take 50,000 Units by mouth every 7 (seven) days. On Sundays   Yes Historical Provider, MD   Ct Head Wo Contrast  08/22/2014   CLINICAL DATA:  81 year old male with syncopal episode.  Weakness.  EXAM: CT HEAD WITHOUT CONTRAST  TECHNIQUE: Contiguous axial images were obtained from the base of the skull through the vertex without intravenous contrast.  COMPARISON:  Most recent head CT 07/30/2013  FINDINGS: No acute intracranial hemorrhage. Encephalomalacia and sequela of right MCA distribution infarct, unchanged in appearance compared to prior exam. Stable atrophy and chronic small vessel ischemic change. Coarse calcifications midline frontal region and 2 cm in transverse dimension, unchanged from prior exam. No subdural fluid collection. No midline shift. The calvarium is intact. Included paranasal sinuses and  mastoid air cells are well aerated. Diminished opacification of right ethmoid air cells from prior.  IMPRESSION: 1. No acute intracranial abnormality. 2. 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 Coarse calcifications in the anterior midline, unchanged from prior exam and likely calcified meningioma. 3. Stable atrophy and chronic small vessel ischemic change.   Electronically Signed   By: Jeb Levering M.D.   On: 08/22/2014 19:42   Dg Chest Port 1 View  08/22/2014   CLINICAL DATA:  Shortness of breath for 1 day  EXAM: PORTABLE CHEST - 1 VIEW  COMPARISON:  07/12/2014  FINDINGS: Focal airspace consolidation in the anterior right upper lobe. Patchy atelectasis in the infrahilar regions. Stable scarring or atelectasis in the left upper lobe extending to the suprahilar region. Heart size upper limits normal. No effusion. Visualized skeletal structures are unremarkable.  IMPRESSION: 1. New anterior right upper lobe infiltrate suggesting pneumonia   Electronically Signed   By: Arne Cleveland M.D.   On: 08/22/2014 19:46    Positive ROS: All other systems have been reviewed and were otherwise negative with the exception of those mentioned in the HPI and as above.  Labs cbc  Recent Labs  08/23/14 0806 08/24/14 0735  WBC 9.2 8.0  HGB 7.6* 8.7*  HCT 23.6* 26.7*  PLT 214 239    Labs inflam No results for input(s): CRP in the last 72 hours.  Invalid input(s): ESR  Labs coag No results for input(s): INR, PTT in the last 72 hours.  Invalid input(s): PT   Recent Labs  08/22/14 1844 08/23/14 0806  NA 136 137  K 4.5 4.1  CL 104 106  CO2 22 23  GLUCOSE 134* 105*  BUN 23 21  CREATININE 1.38* 1.31  CALCIUM 8.5 8.1*    Physical Exam: Filed Vitals:   08/24/14 0505  BP: 148/65  Pulse: 98  Temp: 98.2 F (36.8 C)  Resp: 19   General: Alert, no acute distress Cardiovascular: some pedal edema Respiratory: No cyanosis, no use of accessory musculature GI: No organomegaly, abdomen  is soft and non-tender Skin: No lesions in the area of chief complaint other than those listed below in MSK exam.  Neurologic: Sensation intact distally Psychiatric: Patient is competent for consent with normal mood and affect Lymphatic: No axillary or cervical lymphadenopathy  MUSCULOSKELETAL:  RUE: pain with ROM, distally swelling, SILT M/R/U, +EPL/FPL/IO, fingers WWP.  Other extremities are atraumatic with painless ROM and NVI.  Assessment: R humerus fracture, likley pathologic based on X-ray  Plan: I long talk with the patient and his daughter. I discussed with them the fact that I  feel this is likely a pathologic lesion. Patient and his daughter agree that should he have metastasis to his arm they do not want any treatment done for this I did discuss with her that the next step if we wanted treatment with DP to do a biopsy of the lesion. Both patient and his daughter agree that they do not want this done. In light of that I think we can treat his fracture nonoperatively with a Sarmiento brace.   F/u with me in Diana Eves, D, MD Cell [phone]   08/24/2014 11:05 AM

## 2014-08-24 NOTE — Progress Notes (Signed)
Family (pt's daughter Mrs. MaGee) voicing concerns re: "arm not set" and requesting to speak with orthopedist. Aware of order for brace and awaiting delivery this afternoon. Requesting something to be done sooner. Spoke with Dr. Alain Marion. Orders received for sling to be placed by ortho tech and to inform family physician will round soon (after next surgery case).

## 2014-08-24 NOTE — Clinical Social Work Note (Signed)
Social work consult received that patient would like a SNF in [place], did not have time to assess patient will complete tomorrow.  Jones Broom. [place], MSW, [place] 08/24/2014 6:01 PM

## 2014-08-24 NOTE — Progress Notes (Signed)
Patient complaining of right arm pain post transfer from bed to bedside commode, patient unable to lift arm up upon assessment, Md made aware. New orders given.

## 2014-08-24 NOTE — Progress Notes (Addendum)
PATIENT DETAILS Name: Marvin [name] Age: 81 y.o. Sex: male Date of Birth: 1914-04-22 Admit Date: 08/22/2014 Admitting Physician Etta Quill, DO POE:[email or web address],RXVQM, MD  Brief narrative:  Patient is a 81 year old Caucasian male with a history of non-small cell lung cancer status post radiation treatment, chronic kidney disease, chronic normocytic anemia, myasthenia gravis admitted on 1/5 with weakness, and exertional dyspnea. Chest x-ray on admission suggestive of pneumonia. Patient admitted and started on intravenous antibiotics, since having significant worsening anemia was transfused 1 unit of PRBC on 08/23/14. After discussion with family, we decided against further investigation including cardiac or pulmonary evaluation. Unfortunately on 08/24/14 while attempting to get up from the bedside commode, patient had significant pain in his right upper extremity. X-ray shows right femur fracture, likely a pathological fracture given his history of lung cancer. Plans are not to pursue any further investigations, have consulted orthopedics for nonsurgical management. I've also consulted palliative care medicine, patient previously from ALF, will require SNF on discharge.  Subjective: Right arm pain-"heard a pop" while getting up from bedside commode.  Assessment/Plan: Principal Problem:   HCAP (healthcare-associated pneumonia): Suspect some aspiration component given history of myasthenia gravis. Continue with vancomycin and cefepime. Blood cultures on 1/5 negative, urine streptococcal and legionella antigen negative.HIV antibody pending.  Active Problems:   Shortness of breath: Seems disproportionate to the amount of infiltrate seen on chest x-ray. Appears significantly more anemic than baseline. Suspect exertional shortness of breath due to the combination of pneumonia and anemia. Continue antibiotics, transfused 1 unit of PRBC on 1/6. Has a history of lung cancer, at risk for VT E.  However do not think that further workup including CTA chest, echocardiogram would change overall management, as patient appears very frail and has had history of falls recently. Now given pathologic Right humerus fracture, have consulted palliative care medicine. After discussion with family on 1/7, suspect we will slowly gradually moved to symptomatic management and comfort care.   Subacute blood loss Anemia on top of anemia of chronic kidney disease: Appears to have chronic anemia-? Secondary to chronic disease. Further worsening of hemoglobin probably secondary to recent GI bleed-patient endorses intermittent hematochezia and melena. Rectal exam done by this M.D., stools brown in color but weakly guaiac positive. Not a candidate for endoscopic procedures-family very understanding. Transfused 1 unit of PRBC on 1/6, hemoglobin improved to 8.7. Follow periodically.    Right humerus fracture: Likely pathologic fracture, per x-ray-possible underlying lesion present. Has history of lung cancer, perhaps has metastatic deposits to bone.This occurred on 1/7-when patient heard a "pop" while being assisted off the commode by nursing staff. Per family, ?recent fall prior to admission as well. Per family-patient was noted to have arm swelling over the past few days prior to admission.  I have advised against any further investigation/workup-family agreeable and willing to pursue pain control, orthopedics (Dr Percell Miller) consulted for sling/cast placement. We will consult palliative care. Start tramadol for moderate pain, IV morphine for severe pain. Family aware that patient would likely need SNF on discharge, and that this will likely cause patient to be less ambulatory, further causing worsening of his generalized weakness.   Myasthenia gravis: Continue Mestinon and prednisone   BPH: Continue Flomax   Chronic kidney disease stage III: Creatinine close to usual baseline   Hypertension: Recently patient off all  antihypertensive medications by PCP. BP appears controlled. Follow.   Malnutrition of moderate degree: Continue supplements   Failure to thrive syndrome:  DO NOT RESUSCITATE.   Disposition: Remain inpatient-will require SNF  Antibiotics:  See Below   Anti-infectives    Start     Dose/Rate Route Frequency Ordered Stop   08/24/14 0600  ceFEPIme (MAXIPIME) 1 g in dextrose 5 % 50 mL IVPB     1 g100 mL/hr over 30 Minutes Intravenous Every 24 hours 08/23/14 1039     08/23/14 2200  vancomycin (VANCOCIN) IVPB 1000 mg/200 mL premix     1,000 mg200 mL/hr over 60 Minutes Intravenous Every 24 hours 08/23/14 1047     08/22/14 2300  ceFEPIme (MAXIPIME) 1 g in dextrose 5 % 50 mL IVPB  Status:  Discontinued     1 g100 mL/hr over 30 Minutes Intravenous 3 times per day 08/22/14 2221 08/23/14 1039   08/22/14 2200  ceFEPIme (MAXIPIME) 1 g in dextrose 5 % 50 mL IVPB  Status:  Discontinued     1 g100 mL/hr over 30 Minutes Intravenous 3 times per day 08/22/14 2103 08/22/14 2221   08/22/14 2115  vancomycin (VANCOCIN) 1,500 mg in sodium chloride 0.9 % 500 mL IVPB     1,500 mg250 mL/hr over 120 Minutes Intravenous  Once 08/22/14 2114 08/22/14 2331   08/22/14 2100  levofloxacin (LEVAQUIN) IVPB 500 mg  Status:  Discontinued     500 mg100 mL/hr over 60 Minutes Intravenous  Once 08/22/14 2058 08/22/14 2103      DVT Prophylaxis: Prophylactic Heparin  Code Status: DNR   Family Communication Daughter at bedside  Procedures:  None  CONSULTS:  None  Time spent 40 minutes-which includes 50% of the time with face-to-face with patient/ family and coordinating care related to the above assessment and plan.  MEDICATIONS: Scheduled Meds: . aspirin  81 mg Oral BH-q7a  . ceFEPime (MAXIPIME) IV  1 g Intravenous Q24H  . feeding supplement (ENSURE COMPLETE)  237 mL Oral BID BM  . heparin  5,000 Units Subcutaneous 3 times per day  . predniSONE  5 mg Oral TID  . pyridostigmine  60 mg Oral TID  . simvastatin   5 mg Oral QHS  . tamsulosin  0.4 mg Oral QHS  . vancomycin  1,000 mg Intravenous Q24H   Continuous Infusions:  PRN Meds:.acetaminophen, morphine injection, traMADol    PHYSICAL EXAM: Vital signs in last 24 hours: Filed Vitals:   08/23/14 2000 08/23/14 2100 08/23/14 2149 08/24/14 0505  BP: 145/98 133/47 142/51 148/65  Pulse: 86 83 72 98  Temp: 98.3 F (36.8 C) 98.5 F (36.9 C) 98.4 F (36.9 C) 98.2 F (36.8 C)  TempSrc: Oral Oral Oral   Resp: 19 18 18 19   Height:      Weight:      SpO2: 94% 95% 95% 94%    Weight change:  Filed Weights   08/22/14 2233  Weight: 74.526 kg (164 lb 4.8 oz)   Body mass index is 25.73 kg/(m^2).   Gen Exam: Awake and alert with clear speech.  Hard of hearing. Neck: Supple, No JVD.   Chest: B/L Clear.   CVS: S1 S2 Regular, no murmurs.  Abdomen: soft, BS +, non tender, non distended.  Extremities: no edema, lower extremities warm to touch.Right arm +swelling Neurologic: Non Focal.   Skin: No Rash.   Wounds: N/A.   Intake/Output from previous day:  Intake/Output Summary (Last 24 hours) at 08/24/14 1337 Last data filed at 08/24/14 0823  Gross per 24 hour  Intake    845 ml  Output  0 ml  Net    845 ml     LAB RESULTS: CBC  Recent Labs Lab 08/22/14 1844 08/23/14 0806 08/24/14 0735  WBC 12.1* 9.2 8.0  HGB 7.9* 7.6* 8.7*  HCT 24.7* 23.6* 26.7*  PLT 243 214 239  MCV 92.2 93.7 89.6  MCH 29.5 30.2 29.2  MCHC 32.0 32.2 32.6  RDW 17.1* 16.9* 18.0*  LYMPHSABS 0.4*  --   --   MONOABS 1.0  --   --   EOSABS 0.0  --   --   BASOSABS 0.0  --   --     Chemistries   Recent Labs Lab 08/22/14 1844 08/23/14 0806  NA 136 137  K 4.5 4.1  CL 104 106  CO2 22 23  GLUCOSE 134* 105*  BUN 23 21  CREATININE 1.38* 1.31  CALCIUM 8.5 8.1*    CBG:  Recent Labs Lab 08/22/14 1820  GLUCAP 141*    GFR Estimated Creatinine Clearance: 29.4 mL/min (by C-G formula based on Cr of 1.31).  Coagulation profile No results for  input(s): INR, PROTIME in the last 168 hours.  Cardiac Enzymes  Recent Labs Lab 08/22/14 1844  TROPONINI 0.03    Invalid input(s): POCBNP No results for input(s): DDIMER in the last 72 hours. No results for input(s): HGBA1C in the last 72 hours. No results for input(s): CHOL, HDL, LDLCALC, TRIG, CHOLHDL, LDLDIRECT in the last 72 hours. No results for input(s): TSH, T4TOTAL, T3FREE, THYROIDAB in the last 72 hours.  Invalid input(s): FREET3  Recent Labs  08/23/14 1635  VITAMINB12 621  FOLATE >20.0  FERRITIN 394*  TIBC 195*  IRON 13*  RETICCTPCT 3.1   No results for input(s): LIPASE, AMYLASE in the last 72 hours.  Urine Studies No results for input(s): UHGB, CRYS in the last 72 hours.  Invalid input(s): UACOL, UAPR, USPG, UPH, UTP, UGL, UKET, UBIL, UNIT, UROB, ULEU, UEPI, UWBC, URBC, UBAC, CAST, UCOM, BILUA  MICROBIOLOGY: Recent Results (from the past 240 hour(s))  Culture, blood (routine x 2) Call MD if unable to obtain prior to antibiotics being given     Status: None (Preliminary result)   Collection Time: 08/22/14  9:14 PM  Result Value Ref Range Status   Specimen Description BLOOD LEFT ARM  Final   Special Requests BOTTLES DRAWN AEROBIC AND ANAEROBIC 10CC EACH  Final   Culture   Final           BLOOD CULTURE RECEIVED NO GROWTH TO DATE CULTURE WILL BE HELD FOR 5 DAYS BEFORE ISSUING A FINAL NEGATIVE REPORT Performed at [facility]    Report Status PENDING  Incomplete  Culture, blood (routine x 2) Call MD if unable to obtain prior to antibiotics being given     Status: None (Preliminary result)   Collection Time: 08/22/14  9:21 PM  Result Value Ref Range Status   Specimen Description BLOOD LEFT HAND  Final   Special Requests BOTTLES DRAWN AEROBIC AND ANAEROBIC 10CC EACH  Final   Culture   Final           BLOOD CULTURE RECEIVED NO GROWTH TO DATE CULTURE WILL BE HELD FOR 5 DAYS BEFORE ISSUING A FINAL NEGATIVE REPORT Performed at [facility]     Report Status PENDING  Incomplete    RADIOLOGY STUDIES/RESULTS: Ct Head Wo Contrast  08/22/2014   CLINICAL DATA:  81 year old male with syncopal episode.  Weakness.  EXAM: CT HEAD WITHOUT CONTRAST  TECHNIQUE: Contiguous axial images were obtained  from the base of the skull through the vertex without intravenous contrast.  COMPARISON:  Most recent head CT 07/30/2013  FINDINGS: No acute intracranial hemorrhage. Encephalomalacia and sequela of right MCA distribution infarct, unchanged in appearance compared to prior exam. Stable atrophy and chronic small vessel ischemic change. Coarse calcifications midline frontal region and 2 cm in transverse dimension, unchanged from prior exam. No subdural fluid collection. No midline shift. The calvarium is intact. Included paranasal sinuses and mastoid air cells are well aerated. Diminished opacification of right ethmoid air cells from prior.  IMPRESSION: 1. No acute intracranial abnormality. 2. 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 Coarse calcifications in the anterior midline, unchanged from prior exam and likely calcified meningioma. 3. Stable atrophy and chronic small vessel ischemic change.   Electronically Signed   By: Jeb Levering M.D.   On: 08/22/2014 19:42   Dg Chest Port 1 View  08/22/2014   CLINICAL DATA:  Shortness of breath for 1 day  EXAM: PORTABLE CHEST - 1 VIEW  COMPARISON:  07/12/2014  FINDINGS: Focal airspace consolidation in the anterior right upper lobe. Patchy atelectasis in the infrahilar regions. Stable scarring or atelectasis in the left upper lobe extending to the suprahilar region. Heart size upper limits normal. No effusion. Visualized skeletal structures are unremarkable.  IMPRESSION: 1. New anterior right upper lobe infiltrate suggesting pneumonia   Electronically Signed   By: Arne Cleveland M.D.   On: 08/22/2014 19:46   Dg Shoulder Right Port  08/24/2014   CLINICAL DATA:  New 81 year old male with abnormality right humerus.  Subsequent encounter.  EXAM: PORTABLE RIGHT SHOULDER - 2+ VIEW  COMPARISON:  08/22/2014 and 07/12/2014.  FINDINGS: Fracture of the midshaft of the right humerus with angulation and mild separation. Underlying lesion (pathologic fracture) may be present.  Progressive consolidation right upper lobe may represent infectious infiltrate and will need to be followed until clearance.  Pulmonary vascular congestion.  Moderate acromioclavicular joint degenerative changes.  IMPRESSION: Fracture midshaft right humerus with angulation and separation of fracture fragments. This may be related to pathologic fracture as underlying lesion may be present.  Progressive consolidation right upper lobe may represent infectious infiltrate. Follow-up and clearance recommended.  Pulmonary vascular congestion.  These results will be called to the ordering clinician or representative by the Radiologist Assistant, and communication documented in the PACS or zVision Dashboard.   Electronically Signed   By: Chauncey Cruel M.D.   On: 08/24/2014 11:18    Oren Binet, MD  [facility] Pager:[phone]  If 7PM-7AM, please contact night-coverage [email or web address] Password [email or web address] 08/24/2014, 1:37 PM   LOS: 2 days

## 2014-08-25 DIAGNOSIS — Z515 Encounter for palliative care: Secondary | ICD-10-CM

## 2014-08-25 DIAGNOSIS — S42302S Unspecified fracture of shaft of humerus, left arm, sequela: Secondary | ICD-10-CM

## 2014-08-25 DIAGNOSIS — M79601 Pain in right arm: Secondary | ICD-10-CM | POA: Diagnosis not present

## 2014-08-25 NOTE — Progress Notes (Signed)
ANTIBIOTIC CONSULT NOTE - INITIAL  Pharmacy Consult for vancomycin Indication: pneumonia  Allergies  Allergen Reactions  . Hydrocodone     unknown    Patient Measurements: Height: 5\' 7"  (170.2 cm) Weight: 164 lb 4.8 oz (74.526 kg) IBW/kg (Calculated) : 66.1 Adjusted Body Weight:  Vital Signs: Temp: 97.7 F (36.5 C) (01/08 0554) Temp Source: Oral (01/08 0554) BP: 135/75 mmHg (01/08 0554) Pulse Rate: 81 (01/08 0554)   Labs:  Recent Labs  08/22/14 1844 08/23/14 0806 08/24/14 0735  WBC 12.1* 9.2 8.0  HGB 7.9* 7.6* 8.7*  PLT 243 214 239  CREATININE 1.38* 1.31  --     Microbiology: Recent Results (from the past 720 hour(s))  Culture, blood (routine x 2) Call MD if unable to obtain prior to antibiotics being given     Status: None (Preliminary result)   Collection Time: 08/22/14  9:14 PM  Result Value Ref Range Status   Specimen Description BLOOD LEFT ARM  Final   Special Requests BOTTLES DRAWN AEROBIC AND ANAEROBIC 10CC EACH  Final   Culture   Final           BLOOD CULTURE RECEIVED NO GROWTH TO DATE CULTURE WILL BE HELD FOR 5 DAYS BEFORE ISSUING A FINAL NEGATIVE REPORT Performed at [facility]    Report Status PENDING  Incomplete  Culture, blood (routine x 2) Call MD if unable to obtain prior to antibiotics being given     Status: None (Preliminary result)   Collection Time: 08/22/14  9:21 PM  Result Value Ref Range Status   Specimen Description BLOOD LEFT HAND  Final   Special Requests BOTTLES DRAWN AEROBIC AND ANAEROBIC 10CC EACH  Final   Culture   Final           BLOOD CULTURE RECEIVED NO GROWTH TO DATE CULTURE WILL BE HELD FOR 5 DAYS BEFORE ISSUING A FINAL NEGATIVE REPORT Performed at [facility]    Report Status PENDING  Incomplete   Assessment: Admitted with SOB with exertion, concern for HCAP given recent hospitalizatoin in November, currently afebrile, WBC normal, Scr 1.31, eCrCl 30 ml/min.   Goal of Therapy:  Vancomycin trough level  15-20 mcg/ml   Plan: Cefepime 1 g IV q24 Vancomycin 1g IV daily  Monitor renal fx, cultures, VT as indicated  May be able to stop vancomycin soon as MRSA not isolated  Heide Guile, PharmD, [facility] Clinical Pharmacist Pager [phone]   08/25/2014 12:40 PM

## 2014-08-25 NOTE — Progress Notes (Signed)
PATIENT DETAILS Name: Marvin [name] Age: 81 y.o. Sex: male Date of Birth: 08/02/1914 Admit Date: 08/22/2014 Admitting Physician Etta Quill, DO JYN:[id number],QMVHQ, MD  Brief narrative:  Patient is a 81 year old Caucasian male with a history of non-small cell lung cancer status post radiation treatment, chronic kidney disease, chronic normocytic anemia, myasthenia gravis admitted on 1/5 with weakness, and exertional dyspnea. Chest x-ray on admission suggestive of pneumonia. Patient admitted and started on intravenous antibiotics, since having significant worsening anemia was transfused 1 unit of PRBC on 08/23/14. After discussion with family, we decided against further investigation including cardiac or pulmonary evaluation. Unfortunately on 08/24/14 while attempting to get up from the bedside commode, patient had significant pain in his right upper extremity. X-ray shows right femur fracture, likely a pathological fracture given his history of lung cancer. Plans are not to pursue any further investigations, have consulted orthopedics for nonsurgical management. I've also consulted palliative care medicine, patient previously from ALF, will require SNF on discharge.  Subjective: No complaints today. Had a conversation with the patient and family today about obtaining a bone scan to confirm whether there is a bone met present. They are in agreement.   Assessment/Plan: Principal Problem:   HCAP (healthcare-associated pneumonia): Suspect some aspiration component given history of myasthenia gravis. Continue with vancomycin and cefepime. Blood cultures on 1/5 negative, urine streptococcal and legionella antigen negative.HIV antibody pending.  Active Problems:   Shortness of breath: Seems disproportionate to the amount of infiltrate seen on chest x-ray. Appears significantly more anemic than baseline. Suspect exertional shortness of breath due to the combination of pneumonia and anemia.  Continue antibiotics, transfused 1 unit of PRBC on 1/6. Has a history of lung cancer, at risk for VTE. However do not think that further workup including CTA chest, echocardiogram would change overall management, as patient appears very frail and has had history of falls recently. Given suspected pathologic right humerus fracture, Dr Tera Helper consulted palliative care medicine.  Have recommended a CXR in 2 wks to ensure clearance and no underlying metastasis.   Subacute blood loss Anemia on top of anemia of chronic kidney disease: Appears to have chronic anemia-? Secondary to chronic disease. Further worsening of hemoglobin probably secondary to recent GI bleed-patient endorses intermittent hematochezia and melena. Rectal exam done by this M.D., stools brown in color but weakly guaiac positive. Not a candidate for endoscopic procedures-family very understanding. Transfused 1 unit of PRBC on 1/6, hemoglobin improved to 8.7. Follow periodically.    Right humerus fracture: Likely pathologic fracture, per x-ray-possible underlying lesion present. Has history of lung cancer, perhaps has metastatic deposits to bone.This occurred on 1/7-when patient heard a "pop" while being assisted off the commode by nursing staff. Per family, ?recent fall prior to admission as well. Per family-patient was noted to have arm swelling over the past few days prior to admission. Obtain bone scan in AM- ortho managing as well.    Myasthenia gravis: Continue Mestinon and prednisone   BPH: Continue Flomax   Chronic kidney disease stage III: Creatinine close to usual baseline   Hypertension: Recently patient off all antihypertensive medications by PCP. BP appears controlled. Follow.   Malnutrition of moderate degree: Continue supplements   Failure to thrive syndrome: DO NOT RESUSCITATE.   Disposition: Remain inpatient-will require SNF  Antibiotics:  See Below   Anti-infectives    Start     Dose/Rate Route Frequency  Ordered Stop   08/24/14 0600  ceFEPIme (MAXIPIME)  1 g in dextrose 5 % 50 mL IVPB     1 g100 mL/hr over 30 Minutes Intravenous Every 24 hours 08/23/14 1039     08/23/14 2200  vancomycin (VANCOCIN) IVPB 1000 mg/200 mL premix     1,000 mg200 mL/hr over 60 Minutes Intravenous Every 24 hours 08/23/14 1047     08/22/14 2300  ceFEPIme (MAXIPIME) 1 g in dextrose 5 % 50 mL IVPB  Status:  Discontinued     1 g100 mL/hr over 30 Minutes Intravenous 3 times per day 08/22/14 2221 08/23/14 1039   08/22/14 2200  ceFEPIme (MAXIPIME) 1 g in dextrose 5 % 50 mL IVPB  Status:  Discontinued     1 g100 mL/hr over 30 Minutes Intravenous 3 times per day 08/22/14 2103 08/22/14 2221   08/22/14 2115  vancomycin (VANCOCIN) 1,500 mg in sodium chloride 0.9 % 500 mL IVPB     1,500 mg250 mL/hr over 120 Minutes Intravenous  Once 08/22/14 2114 08/22/14 2331   08/22/14 2100  levofloxacin (LEVAQUIN) IVPB 500 mg  Status:  Discontinued     500 mg100 mL/hr over 60 Minutes Intravenous  Once 08/22/14 2058 08/22/14 2103      DVT Prophylaxis: Prophylactic Heparin  Code Status: DNR   Family Communication Daughter at bedside  Procedures:  None  CONSULTS:  Ortho  Time spent 45 minutes-which includes 50% of the time with face-to-face with patient/ family and coordinating care related to the above assessment and plan.  MEDICATIONS: Scheduled Meds: . aspirin  81 mg Oral BH-q7a  . ceFEPime (MAXIPIME) IV  1 g Intravenous Q24H  . feeding supplement (ENSURE COMPLETE)  237 mL Oral BID BM  . heparin  5,000 Units Subcutaneous 3 times per day  . predniSONE  5 mg Oral TID  . pyridostigmine  60 mg Oral TID  . simvastatin  5 mg Oral QHS  . tamsulosin  0.4 mg Oral QHS  . vancomycin  1,000 mg Intravenous Q24H   Continuous Infusions:  PRN Meds:.acetaminophen, morphine injection, morphine CONCENTRATE, traMADol    PHYSICAL EXAM: Vital signs in last 24 hours: Filed Vitals:   08/24/14 1410 08/24/14 2206 08/25/14 0554 08/25/14  1430  BP: 143/53 168/65 135/75 150/53  Pulse: 70 75 81 87  Temp: 98.6 F (37 C) 98.2 F (36.8 C) 97.7 F (36.5 C) 98 F (36.7 C)  TempSrc: Oral Oral Oral Oral  Resp: $Remo'18 17 16 16  'mJsQV$ Height:      Weight:      SpO2: 92% 93% 92% 95%    Weight change:  Filed Weights   08/22/14 2233  Weight: 74.526 kg (164 lb 4.8 oz)   Body mass index is 25.73 kg/(m^2).   Gen Exam: Awake and alert with clear speech.  Hard of hearing. Neck: Supple, No JVD.   Chest: B/L Clear.   CVS: S1 S2 Regular, no murmurs.  Abdomen: soft, BS +, non tender, non distended.  Extremities: no edema, lower extremities warm to touch. Right arm +swelling Neurologic: Non Focal.   Skin: No Rash.   Wounds: N/A.   Intake/Output from previous day:  Intake/Output Summary (Last 24 hours) at 08/25/14 1607 Last data filed at 08/25/14 0640  Gross per 24 hour  Intake    125 ml  Output    125 ml  Net      0 ml     LAB RESULTS: CBC  Recent Labs Lab 08/22/14 1844 08/23/14 0806 08/24/14 0735  WBC 12.1* 9.2 8.0  HGB 7.9* 7.6*  8.7*  HCT 24.7* 23.6* 26.7*  PLT 243 214 239  MCV 92.2 93.7 89.6  MCH 29.5 30.2 29.2  MCHC 32.0 32.2 32.6  RDW 17.1* 16.9* 18.0*  LYMPHSABS 0.4*  --   --   MONOABS 1.0  --   --   EOSABS 0.0  --   --   BASOSABS 0.0  --   --     Chemistries   Recent Labs Lab 08/22/14 1844 08/23/14 0806  NA 136 137  K 4.5 4.1  CL 104 106  CO2 22 23  GLUCOSE 134* 105*  BUN 23 21  CREATININE 1.38* 1.31  CALCIUM 8.5 8.1*    CBG:  Recent Labs Lab 08/22/14 1820  GLUCAP 141*    GFR Estimated Creatinine Clearance: 29.4 mL/min (by C-G formula based on Cr of 1.31).  Coagulation profile No results for input(s): INR, PROTIME in the last 168 hours.  Cardiac Enzymes  Recent Labs Lab 08/22/14 1844  TROPONINI 0.03    Invalid input(s): POCBNP No results for input(s): DDIMER in the last 72 hours. No results for input(s): HGBA1C in the last 72 hours. No results for input(s): CHOL, HDL,  LDLCALC, TRIG, CHOLHDL, LDLDIRECT in the last 72 hours. No results for input(s): TSH, T4TOTAL, T3FREE, THYROIDAB in the last 72 hours.  Invalid input(s): FREET3  Recent Labs  08/23/14 1635  VITAMINB12 621  FOLATE >20.0  FERRITIN 394*  TIBC 195*  IRON 13*  RETICCTPCT 3.1   No results for input(s): LIPASE, AMYLASE in the last 72 hours.  Urine Studies No results for input(s): UHGB, CRYS in the last 72 hours.  Invalid input(s): UACOL, UAPR, USPG, UPH, UTP, UGL, UKET, UBIL, UNIT, UROB, ULEU, UEPI, UWBC, URBC, UBAC, CAST, UCOM, BILUA  MICROBIOLOGY: Recent Results (from the past 240 hour(s))  Culture, blood (routine x 2) Call MD if unable to obtain prior to antibiotics being given     Status: None (Preliminary result)   Collection Time: 08/22/14  9:14 PM  Result Value Ref Range Status   Specimen Description BLOOD LEFT ARM  Final   Special Requests BOTTLES DRAWN AEROBIC AND ANAEROBIC 10CC EACH  Final   Culture   Final           BLOOD CULTURE RECEIVED NO GROWTH TO DATE CULTURE WILL BE HELD FOR 5 DAYS BEFORE ISSUING A FINAL NEGATIVE REPORT Performed at [facility]    Report Status PENDING  Incomplete  Culture, blood (routine x 2) Call MD if unable to obtain prior to antibiotics being given     Status: None (Preliminary result)   Collection Time: 08/22/14  9:21 PM  Result Value Ref Range Status   Specimen Description BLOOD LEFT HAND  Final   Special Requests BOTTLES DRAWN AEROBIC AND ANAEROBIC 10CC EACH  Final   Culture   Final           BLOOD CULTURE RECEIVED NO GROWTH TO DATE CULTURE WILL BE HELD FOR 5 DAYS BEFORE ISSUING A FINAL NEGATIVE REPORT Performed at [facility]    Report Status PENDING  Incomplete    RADIOLOGY STUDIES/RESULTS: Ct Head Wo Contrast  08/22/2014   CLINICAL DATA:  81 year old male with syncopal episode.  Weakness.  EXAM: CT HEAD WITHOUT CONTRAST  TECHNIQUE: Contiguous axial images were obtained from the base of the skull through the vertex  without intravenous contrast.  COMPARISON:  Most recent head CT 07/30/2013  FINDINGS: No acute intracranial hemorrhage. Encephalomalacia and sequela of right MCA distribution infarct, unchanged  in appearance compared to prior exam. Stable atrophy and chronic small vessel ischemic change. Coarse calcifications midline frontal region and 2 cm in transverse dimension, unchanged from prior exam. No subdural fluid collection. No midline shift. The calvarium is intact. Included paranasal sinuses and mastoid air cells are well aerated. Diminished opacification of right ethmoid air cells from prior.  IMPRESSION: 1. No acute intracranial abnormality. 2. 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 Coarse calcifications in the anterior midline, unchanged from prior exam and likely calcified meningioma. 3. Stable atrophy and chronic small vessel ischemic change.   Electronically Signed   By: Jeb Levering M.D.   On: 08/22/2014 19:42   Dg Chest Port 1 View  08/22/2014   CLINICAL DATA:  Shortness of breath for 1 day  EXAM: PORTABLE CHEST - 1 VIEW  COMPARISON:  07/12/2014  FINDINGS: Focal airspace consolidation in the anterior right upper lobe. Patchy atelectasis in the infrahilar regions. Stable scarring or atelectasis in the left upper lobe extending to the suprahilar region. Heart size upper limits normal. No effusion. Visualized skeletal structures are unremarkable.  IMPRESSION: 1. New anterior right upper lobe infiltrate suggesting pneumonia   Electronically Signed   By: Arne Cleveland M.D.   On: 08/22/2014 19:46   Dg Shoulder Right Port  08/24/2014   CLINICAL DATA:  New 81 year old male with abnormality right humerus. Subsequent encounter.  EXAM: PORTABLE RIGHT SHOULDER - 2+ VIEW  COMPARISON:  08/22/2014 and 07/12/2014.  FINDINGS: Fracture of the midshaft of the right humerus with angulation and mild separation. Underlying lesion (pathologic fracture) may be present.  Progressive consolidation right upper lobe  may represent infectious infiltrate and will need to be followed until clearance.  Pulmonary vascular congestion.  Moderate acromioclavicular joint degenerative changes.  IMPRESSION: Fracture midshaft right humerus with angulation and separation of fracture fragments. This may be related to pathologic fracture as underlying lesion may be present.  Progressive consolidation right upper lobe may represent infectious infiltrate. Follow-up and clearance recommended.  Pulmonary vascular congestion.  These results will be called to the ordering clinician or representative by the Radiologist Assistant, and communication documented in the PACS or zVision Dashboard.   Electronically Signed   By: Chauncey Cruel M.D.   On: 08/24/2014 11:18    Debbe Odea, MD  [facility]  If 7PM-7AM, please contact night-coverage [email or web address] Password [email or web address] 08/25/2014, 4:07 PM   LOS: 3 days

## 2014-08-25 NOTE — Progress Notes (Signed)
OT Cancellation Note  Patient Details Name: Marvin [name] MRN: [id number] DOB: September 21, 1913   Cancelled Treatment:     Pt. Is not medically ready for treatment. Pt. Had fall at hospital yesterday and sustained UE fx. MD has not addressed mobility or WB status.   Jacquline Terrill 08/25/2014, 2:57 PM

## 2014-08-25 NOTE — Clinical Social Work Placement (Addendum)
Clinical Social Work Department CLINICAL SOCIAL WORK PLACEMENT NOTE 08/25/2014  Patient:  EWING, FANDINO  Account Number:  [id number] Admit date:  08/22/2014  Clinical Social Worker:  Aliciana Ricciardi, LCSWA  Date/time:  08/25/2014 05:20 PM  Clinical Social Work is seeking post-discharge placement for this patient at the following level of care:   SKILLED NURSING   (*CSW will update this form in Epic as items are completed)   08/25/2014  Patient/family provided with [place] Department of Clinical Social Work's list of facilities offering this level of care within the geographic area requested by the patient (or if unable, by the patient's family).  08/25/2014  Patient/family informed of their freedom to choose among providers that offer the needed level of care, that participate in Medicare, Medicaid or managed care program needed by the patient, have an available bed and are willing to accept the patient.  08/25/2014  Patient/family informed of [facility]' ownership interest in [facility], as well as of the fact that they are under no obligation to receive care at this facility.  PASARR submitted to EDS on 08/25/2014 PASARR number received on 08/25/2014  FL2 transmitted to all facilities in geographic area requested by pt/family on  08/25/2014 FL2 transmitted to all facilities within larger geographic area on 08/25/2014  Patient informed that his/her managed care company has contracts with or will negotiate with  certain facilities, including the following:     Patient/family informed of bed offers received:  08/26/2014 Patient chooses bed at [facility] (Evette Cristal, MSW, [place], updated 08/28/14)                                         Physician recommends and patient chooses bed at    Patient to be transferred to [facility] on 08/28/14 Evette Cristal, MSW, LCSWA, updated 08/28/14)   Patient to be transferred to facility by [facility] Evette Cristal, MSW, LCSWA, updated 08/28/14) Patient and family notified of transfer on 08/28/14 Evette Cristal, MSW, LCSWA, updated 08/28/14) Name of family member notified: Laural Benes and Lorenda Ishihara (Evette Cristal, MSW, [place], updated 08/28/14)   The following physician request were entered in Epic:  Additional Comments:  Evette Cristal, MSW, [place]

## 2014-08-25 NOTE — Clinical Social Work Note (Signed)
Met with patient and family to discuss what the discharge plan is.  Patient and family agreeable to going to SNF for short term rehab, once he is medically ready and discharge orders have been received.  CSW will continue to follow patient's progress.  Jones Broom. Tennant, MSW, [place] 08/25/2014 2:54 PM

## 2014-08-25 NOTE — Clinical Social Work Psychosocial (Signed)
Clinical Social Work Department BRIEF PSYCHOSOCIAL ASSESSMENT 08/25/2014  Patient:  NILE, PRISK     Account Number:  [id number]     Admit date:  08/22/2014  Clinical Social Worker:  Dian Queen  Date/Time:  08/25/2014 02:15 PM  Referred by:  Physician  Date Referred:  08/24/2014 Referred for  SNF Placement   Other Referral:   Interview type:  Family Other interview type:    PSYCHOSOCIAL DATA Living Status:  FACILITY Admitted from facility:  [facility] Level of care:  Assisted Living Primary support name:  Jayvyn Haselton and Laural Benes Primary support relationship to patient:  FAMILY Degree of support available:   Patient's children live in [place], but they have a family friend Aleatha Borer who checks on patient and helps make sure he gets to his appointments and patient has the staff at the ALF.    CURRENT CONCERNS Current Concerns  Post-Acute Placement   Other Concerns:    SOCIAL WORK ASSESSMENT / PLAN Patient is a 81 year old male who lives in [place]. Patient is hard of hearing but alert and oriented.  Patient was able to converse with CSW, but his daughters were at the bedside and able to talk about patient's needs. Patient has a recommendation from PT and OT to go to SNF for short term rehab, explained these options to patient and his daughters.  Patient and daughters are in agreement to go to SNF for short term rehab.  Patient may eventually need palliative care based on his diagnosis, but at this time familiy would like SNF.  Patient will discharge when he is medically ready and discharge orders have been received.   Assessment/plan status:  Psychosocial Support/Ongoing Assessment of Needs Other assessment/ plan:   Information/referral to community resources:    PATIENT'S/FAMILY'S RESPONSE TO PLAN OF CARE: Family and Patient in agreement to going to SNF for short term rehab.    Evette Cristal, MSW, [place]

## 2014-08-25 NOTE — Consult Note (Signed)
Patient Marvin [name]:Marvin [name] A [place]thewey      DOB: Sep 24, 1913      YPP:[id number]     Consult Note from the Palliative Medicine Team at [facility] Requested by: Dr. Sloan Leiter     PCP: Thressa Sheller, MD Reason for Consultation: [place]     Phone [place]  Assessment of patients Current state: I met today with Mr. Fischman and his 2 daughters at bedside (although his participation was limited d/t his hearing impairment). Family is very reasonable and understanding and we discussed the possibility of cancer recurrence vs aging process. We discussed that without further scans/testing we could not know for sure. They do not want to pursue this testing at this time. However they do reasonably want treatment such as antibiotics, fluids, etc. They are also wanting short term rehab but are concerned that he may not bounce back as well as he has before with this arm fracture. We did discuss issues to monitor for decline such as appetite, weight loss, weakness, sleeping more and more. I also introduced them to MOST form to consider for now and for future as his health status progresses- they were very open to this concept but we did not complete today (I did tell them that any provider can help them complete).    Goals of Care: 1.  Code Status: DNR   2. Scope of Treatment: Continue supportive treatment.    4. Disposition: Short term rehab.    3. Symptom Management:   1. Pain: Tramadol 50 mg every 12 hours prn. Roxanol 5 mg every 4 hours prn moderate pain. Morphine 1 mg IV every 4 hours prn severe pain. 2. Constipation: Monitor closely with opioid usage and with minimal ambulation. Consider scheduled Senokot-S.   4. Psychosocial: Emotional support provided to patient and to family at bedside.     Brief HPI: 81 yo male admitted with shortness of breath on exertion, non-productive cough, chills, weakness for a few days r/t RUL pneumonia as seen on CXR treated with antibiotics, IVF, and he  required 1 unit PRBC (h/o anemia?). During hospitalization he "heard a pop" and pain in right arm while attempting to get up from [facility] and x-ray shows right humerus fracture that has been placed in brace by ortho. Given his h/o NSCLC s/p radiation treatment we believe this is likely a pathological fracture given the lack of injury and how easily he fractured. PMH reviewed below.    ROS: + right arm pain    PMH:  Past Medical History  Diagnosis Date  . Hyperlipidemia   . Hypertension   . Dizziness   . Blood clot in vein   . Arthritis   . Peripheral arterial disease   . Carotid artery occlusion   . Ocular myasthenia gravis   . BPH (benign prostatic hyperplasia)   . Nephrolithiasis   . Low back pain   . Gait disorder   . Hx of radiation therapy 07/06/12, 07/08/12, 07/13/12    left upper lung nodule, 3 fractions  . Hearing difficulty   . Lumbago   . Abnormality of gait 05/09/2013  . Hearing deficit     right hearing aid  . Peripheral edema   . Lung cancer 05/2012    left     PSH: Past Surgical History  Procedure Laterality Date  . Appendectomy    . Cataract extraction      bilateral  . Retinal detachment surgery  02/2011  . Thyroidectomy, partial    . Transthoracic  echocardiogram  01/15/2011    EF=>55%, mild conc LVH; mild MR & mild mitral annular calcification; mild TR, RVSP 30-8mmHg; aortic root sclerosis/calcification  . Carotid doppler  04/2011    80-99% stenosis of right prox to mid ICA   I have reviewed the FH and SH and  If appropriate update it with new information. Allergies  Allergen Reactions  . Hydrocodone     unknown   Scheduled Meds: . aspirin  81 mg Oral BH-q7a  . ceFEPime (MAXIPIME) IV  1 g Intravenous Q24H  . feeding supplement (ENSURE COMPLETE)  237 mL Oral BID BM  . heparin  5,000 Units Subcutaneous 3 times per day  . predniSONE  5 mg Oral TID  . pyridostigmine  60 mg Oral TID  . simvastatin  5 mg Oral QHS  . tamsulosin  0.4 mg Oral QHS  .  vancomycin  1,000 mg Intravenous Q24H   Continuous Infusions:  PRN Meds:.acetaminophen, morphine injection, morphine CONCENTRATE, traMADol    BP 135/75 mmHg  Pulse 81  Temp(Src) 97.7 F (36.5 C) (Oral)  Resp 16  Ht 5\' 7"  (1.702 m)  Wt 74.526 kg (164 lb 4.8 oz)  BMI 25.73 kg/m2  SpO2 92%   PPS: 40%   Intake/Output Summary (Last 24 hours) at 08/25/14 1000 Last data filed at 08/25/14 0640  Gross per 24 hour  Intake    235 ml  Output    125 ml  Net    110 ml   LBM: 08/24/13  Physical Exam:  General: NAD, sitting up in chair HEENT: [place]/AT, hearing aides in place Chest: No labored breathing, symmetric CVS: RRR Abdomen: Soft, NT, ND Ext: MAE, no edema, warm to touch, RUE swelling and in brace Neuro: Awake, alert, oriented x 3  Labs: CBC    Component Value Date/Time   WBC 8.0 08/24/2014 0735   RBC 2.98* 08/24/2014 0735   RBC 2.63* 08/23/2014 1635   HGB 8.7* 08/24/2014 0735   HCT 26.7* 08/24/2014 0735   PLT 239 08/24/2014 0735   MCV 89.6 08/24/2014 0735   MCH 29.2 08/24/2014 0735   MCHC 32.6 08/24/2014 0735   RDW 18.0* 08/24/2014 0735   LYMPHSABS 0.4* 08/22/2014 1844   MONOABS 1.0 08/22/2014 1844   EOSABS 0.0 08/22/2014 1844   BASOSABS 0.0 08/22/2014 1844    BMET    Component Value Date/Time   NA 137 08/23/2014 0806   K 4.1 08/23/2014 0806   CL 106 08/23/2014 0806   CO2 23 08/23/2014 0806   GLUCOSE 105* 08/23/2014 0806   BUN 21 08/23/2014 0806   CREATININE 1.31 08/23/2014 0806   CREATININE 1.77* 02/13/2014 0859   CALCIUM 8.1* 08/23/2014 0806   GFRNONAA 44* 08/23/2014 0806   GFRAA 50* 08/23/2014 0806    CMP     Component Value Date/Time   NA 137 08/23/2014 0806   K 4.1 08/23/2014 0806   CL 106 08/23/2014 0806   CO2 23 08/23/2014 0806   GLUCOSE 105* 08/23/2014 0806   BUN 21 08/23/2014 0806   CREATININE 1.31 08/23/2014 0806   CREATININE 1.77* 02/13/2014 0859   CALCIUM 8.1* 08/23/2014 0806   PROT 5.5* 08/22/2014 1844   ALBUMIN 2.8* 08/22/2014  1844   AST 36 08/22/2014 1844   ALT 31 08/22/2014 1844   ALKPHOS 106 08/22/2014 1844   BILITOT 1.2 08/22/2014 1844   GFRNONAA 44* 08/23/2014 0806   GFRAA 50* 08/23/2014 0806    Right Shoulder Xray Reviewed/Impressions: 1) Fracture midshaft right humerus with angulation  and separation of fracture fragments. This may be related to pathologic fracture as underlying lesion may be present.  2) Progressive consolidation right upper lobe may represent infectious infiltrate. Follow-up and clearance recommended.  3) Pulmonary vascular congestion.     Time In Time Out Total Time Spent with Patient Total Overall Time  1000 1110 71mn 761m    Greater than 50%  of this time was spent counseling and coordinating care related to the above assessment and plan.  AlVinie SillNP Palliative Medicine Team Pager # [phone]-F 8a-5p) Team Phone # [phone]/Weekends)

## 2014-08-25 NOTE — Clinical Social Work Note (Signed)
Received phone call from physician saying patient may be ready to discharge tomorrow and to try to see if there is a bed available.  Contacted patient's family to inform them that patient may be discharging tomorrow and he may need to go to a SNF which will take a LOG because insurance will not be able to be authorized until Monday at the earliest.  Clinicals faxed to [facility], phone call made, and message left for insurance to call back with authorization number.  Weekend Education officer, museum and [place] notified about patient possibly needing to discharge tomorrow to an LOG bed.  Informed patient's family that weekend social worker will call tomorrow and give an update to family.   Patient is from [facility].  Jones Broom. [place], MSW, [place] 08/25/2014 6:49 PM

## 2014-08-25 NOTE — Progress Notes (Signed)
PT Cancellation Note  Patient Details Name: Marvin [name] MRN: [id number] DOB: 01-Mar-1914   Cancelled Treatment:    Reason Eval/Treat Not Completed: Patient not medically ready Holding PT evaluation as pt with new humerus fx secondary to pathologic lesion after transferring to [facility] yesterday. No WB restrictions or activity orders. Contacted MD for orders due to concern for other mets/lesions. Family made aware of plan and agreeable to await MD orders. Will follow up next available time.   Candy Sledge A 08/25/2014, 11:26 AM Candy Sledge, PT, DPT [phone]

## 2014-08-25 NOTE — Evaluation (Signed)
Physical Therapy Evaluation Patient Details Name: Marvin [name] MRN: [id number] DOB: 1914/02/25 Today's Date: 08/25/2014   History of Present Illness  Patient is a 81 y/o male who presents to the ED with cough, SOB, chills. Symptoms have been going on for a few days, worse with activity, better at rest. CXR- RUL PNA. S/p Right humerus fx on 1/7 (most likely pathologic based on Xray) s/p transfer to [facility] with nursing. PMH of history of non-small cell lung cancer s/p radiation treatment, CKD, chronic normocytic anemia and myasthenia gravis.    Clinical Impression  Patient presents with functional limitations due to deficits listed in PT problem list (see below). Pt with generalized weakness, pain in RUE secondary to acute humerus fx and impaired activity tolerance. Required coaxing to participate in therapy. Concerned about increased risk of fx with WB during mobility secondary to possible pathological lesions (most likely causing fx of R humerus). No other scans performed to determine evidence of Mets/lesions. Discussed concerns with MD and family regarding mobility and weighed risks vs benefit and MD strongly encouraged PT evaluation. Pt with decreased activity tolerance and limited by pain. Pt could benefit from skilled PT to improve overall mobility however would recommend further imaging to determine increased risk of fx with activity and need WB precautions/restrictions. If increased risk for fx, PT may not be appropriate.    Follow Up Recommendations SNF;Supervision/Assistance - 24 hour    Equipment Recommendations  None recommended by PT    Recommendations for Other Services       Precautions / Restrictions Precautions Precautions: Fall Precaution Comments: Spoke with Dr. Reesa Chew via phone- no activity/WB restrictions despite increased risk for pathological fx secondary to hx of lung ca. Discussed concern for increased risk of fx with WB during mobility esp after humerus fx s/p  transfer. Discussed concerns with family and MD. Family wary and wanted clearance from MD prior to mobility. MD encouraging PT evaluation and mobility.  Required Braces or Orthoses: Sling Other Brace/Splint: Sarmiento brace on RUE s/p humerus fx (tx non operatively). Restrictions Weight Bearing Restrictions: Yes Other Position/Activity Restrictions: No WB restrictions in chart, assuming NWB RUE.      Mobility  Bed Mobility Overal bed mobility: Needs Assistance Bed Mobility: Supine to Sit;Sit to Supine     Supine to sit: Mod assist;HOB elevated Sit to supine: Max assist;+2 for physical assistance   General bed mobility comments: Mod A for trunk elevation. Max A of 2 to return to supine.  Transfers Overall transfer level: Needs assistance Equipment used:  (pole on back of chair) Transfers: Sit to/from Stand Sit to Stand: Max assist         General transfer comment: Max A of 1 with therapist assisting with hip extension using chuck pad- cues for anterior translation and pt pulling up on pole using LUE. Multiple attempts initially without use of polehowever pt unable to stand.  Ambulation/Gait Ambulation/Gait assistance:  (Not assessed.)              Stairs            Wheelchair Mobility    Modified Rankin (Stroke Patients Only)       Balance Overall balance assessment: Needs assistance Sitting-balance support: Feet supported;Single extremity supported Sitting balance-Leahy Scale: Fair Sitting balance - Comments: Impaired trunk control initially sitting EOB requiring UE support. Required mod A to scoot bottom to EOB with LOB. Postural control: Posterior lean Standing balance support: During functional activity Standing balance-Leahy Scale: Zero Standing balance comment:  Able to stand for ~15 sec with 1 UE support and therapist supporting hips/trunk.                             Pertinent Vitals/Pain Pain Assessment: 0-10 Pain Score:  (not  rated on pains cale.) Pain Location: RUE Pain Descriptors / Indicators: Sore Pain Intervention(s): Limited activity within patient's tolerance;Repositioned;Monitored during session    Home Living Family/patient expects to be discharged to:: Skilled nursing facility                      Prior Function           Comments: Pt uses RW for ambulation PTA and assist with ADLs.     Hand Dominance   Dominant Hand: Right    Extremity/Trunk Assessment   Upper Extremity Assessment: Defer to OT evaluation;RUE deficits/detail RUE Deficits / Details: Swelling in right digits. AROM and strength not assessed due to RUE immobilized and acute fx.         Lower Extremity Assessment: Generalized weakness         Communication   Communication: HOH  Cognition Arousal/Alertness: Awake/alert Behavior During Therapy: WFL for tasks assessed/performed Overall Cognitive Status: Within Functional Limits for tasks assessed                      General Comments General comments (skin integrity, edema, etc.): Pt required coaxing and encouraging to participate in therapy.     Exercises        Assessment/Plan    PT Assessment Patient needs continued PT services  PT Diagnosis Generalized weakness;Acute pain   PT Problem List Decreased strength;Pain;Decreased activity tolerance;Decreased balance;Decreased mobility  PT Treatment Interventions Balance training;Patient/family education;Functional mobility training;Therapeutic activities;Therapeutic exercise;Gait training;Neuromuscular re-education   PT Goals (Current goals can be found in the Care Plan section) Acute Rehab PT Goals Patient Stated Goal: none stated PT Goal Formulation: With patient Time For Goal Achievement: 09/08/14 Potential to Achieve Goals: Fair    Frequency Min 2X/week   Barriers to discharge Decreased caregiver support      Co-evaluation               End of Session   Activity Tolerance:  Patient limited by pain Patient left: in bed;with call bell/phone within reach;with bed alarm set Nurse Communication: Mobility status;Precautions         Time: 8416-6063 PT Time Calculation (min) (ACUTE ONLY): 13 min   Charges:   PT Evaluation $Initial PT Evaluation Tier I: 1 Procedure PT Treatments $Therapeutic Activity: 8-22 mins   PT G CodesCandy Sledge A 05-Sep-2014, 5:07 PM  Candy Sledge, [place], DPT [phone]

## 2014-08-26 ENCOUNTER — Inpatient Hospital Stay (HOSPITAL_COMMUNITY): Payer: Medicare Other

## 2014-08-26 LAB — CBC
HEMATOCRIT: 25.7 % — AB (ref 39.0–52.0)
HEMOGLOBIN: 8.2 g/dL — AB (ref 13.0–17.0)
MCH: 28.5 pg (ref 26.0–34.0)
MCHC: 31.9 g/dL (ref 30.0–36.0)
MCV: 89.2 fL (ref 78.0–100.0)
Platelets: 260 10*3/uL (ref 150–400)
RBC: 2.88 MIL/uL — ABNORMAL LOW (ref 4.22–5.81)
RDW: 16.9 % — AB (ref 11.5–15.5)
WBC: 6.6 10*3/uL (ref 4.0–10.5)

## 2014-08-26 MED ORDER — TECHNETIUM TC 99M MEDRONATE IV KIT
25.0000 | PACK | Freq: Once | INTRAVENOUS | Status: AC | PRN
  Administered 2014-08-26: 25 via INTRAVENOUS

## 2014-08-26 MED ORDER — SODIUM CHLORIDE 0.9 % IV BOLUS (SEPSIS)
250.0000 mL | Freq: Once | INTRAVENOUS | Status: AC
  Administered 2014-08-26: 250 mL via INTRAVENOUS

## 2014-08-26 MED ORDER — SODIUM CHLORIDE 0.9 % IV SOLN
INTRAVENOUS | Status: DC
  Administered 2014-08-26: 18:00:00 via INTRAVENOUS

## 2014-08-26 MED ORDER — FUROSEMIDE 10 MG/ML IJ SOLN
20.0000 mg | Freq: Once | INTRAMUSCULAR | Status: AC
  Administered 2014-08-27: 20 mg via INTRAVENOUS
  Filled 2014-08-26: qty 2

## 2014-08-26 NOTE — Clinical Social Work Note (Signed)
CSW met with patient's daughter and provided her with current bed offers: [facility]. Patient's daughter [name] has accepted bed with [facility]. CSW made facility aware of bed acceptance.  CSW to follow tomorrow.   [place], [place] Weekend Clinical Social Worker [phone]

## 2014-08-26 NOTE — Progress Notes (Signed)
PATIENT DETAILS Name: Marvin [name] Age: 81 y.o. Sex: male Date of Birth: [birth year] Admit Date: 08/22/2014 Admitting Physician Etta Quill, DO GLO:VFIEPPIRJ,JOACZ, MD  Brief narrative:  Patient is a 81 year old Caucasian male with a history of non-small cell lung cancer status post radiation treatment, chronic kidney disease, chronic normocytic anemia, myasthenia gravis admitted on 1/5 with weakness, and exertional dyspnea. Chest x-ray on admission suggestive of pneumonia. Patient admitted and started on intravenous antibiotics, since having significant worsening anemia was transfused 1 unit of PRBC on 08/23/14. After discussion with family, we decided against further investigation including cardiac or pulmonary evaluation. Unfortunately on 08/24/14 while attempting to get up from the bedside commode, patient had significant pain in his right upper extremity. X-ray shows right femur fracture, likely a pathological fracture given his history of lung cancer. Plans are not to pursue any further investigations, have consulted orthopedics for nonsurgical management. I've also consulted palliative care medicine, patient previously from ALF, will require SNF on discharge.  Subjective: No complaints today. He was able to stand up with PT yesterday.   Assessment/Plan: Principal Problem:   HCAP (healthcare-associated pneumonia): Suspect some aspiration component given history of myasthenia gravis. Today is day 5 of vancomycin and cefepime. Blood cultures on 1/5 negative, urine streptococcal and legionella antigen negative.HIV antibody pending. Will need to stop antibiotics tomorrow.  Active Problems:   Shortness of breath: . Suspect exertional shortness of breath due to the combination of pneumonia, deconditioning and anemia. Transfused 1 unit of PRBC on 1/6. Has a history of lung cancer, at risk for VTE. However do not think that further workup including CTA chest, echocardiogram would change  overall management, as patient appears very frail and has had history of falls recently. Given suspected pathologic right humerus fracture, Dr Tera Helper consulted palliative care medicine.  I have recommended a CXR in 2 wks to ensure clearance and no underlying metastasis.   Subacute blood loss Anemia on top of anemia of chronic kidney disease: Appears to have chronic anemia-? Secondary to chronic disease. Further worsening of hemoglobin probably secondary to recent GI bleed-patient endorses intermittent hematochezia and melena. Rectal exam done by this M.D., stools brown in color but weakly guaiac positive. Not a candidate for endoscopic procedures-family very understanding. Transfused 1 unit of PRBC on 1/6, hemoglobin improved to 8.7. And has been stable    Right humerus fracture: suspecting pathologic fracture. Has history of lung cancer, perhaps has metastatic deposits to bone.This occurred on 1/7-when patient heard a "pop" while being assisted off the commode by nursing staff. Per family, ?recent fall prior to admission as well. Per family-patient was noted to have arm swelling over the past few days prior to admission. Obtain bone scan- ortho consulted- cont sling   Myasthenia gravis: Continue Mestinon and prednisone   BPH: Continue Flomax   Chronic kidney disease stage III: Creatinine close to usual baseline   Hypertension: Recently patient off all antihypertensive medications by PCP. BP appears controlled. Follow.   Malnutrition of moderate degree: Continue supplements   Failure to thrive syndrome: DO NOT RESUSCITATE.   Disposition: Remain inpatient-will require SNF  Antibiotics:  See Below   Anti-infectives    Start     Dose/Rate Route Frequency Ordered Stop   08/24/14 0600  ceFEPIme (MAXIPIME) 1 g in dextrose 5 % 50 mL IVPB     1 g100 mL/hr over 30 Minutes Intravenous Every 24 hours 08/23/14 1039     08/23/14 2200  vancomycin (VANCOCIN) IVPB 1000 mg/200 mL premix     1,000  mg200 mL/hr over 60 Minutes Intravenous Every 24 hours 08/23/14 1047     08/22/14 2300  ceFEPIme (MAXIPIME) 1 g in dextrose 5 % 50 mL IVPB  Status:  Discontinued     1 g100 mL/hr over 30 Minutes Intravenous 3 times per day 08/22/14 2221 08/23/14 1039   08/22/14 2200  ceFEPIme (MAXIPIME) 1 g in dextrose 5 % 50 mL IVPB  Status:  Discontinued     1 g100 mL/hr over 30 Minutes Intravenous 3 times per day 08/22/14 2103 08/22/14 2221   08/22/14 2115  vancomycin (VANCOCIN) 1,500 mg in sodium chloride 0.9 % 500 mL IVPB     1,500 mg250 mL/hr over 120 Minutes Intravenous  Once 08/22/14 2114 08/22/14 2331   08/22/14 2100  levofloxacin (LEVAQUIN) IVPB 500 mg  Status:  Discontinued     500 mg100 mL/hr over 60 Minutes Intravenous  Once 08/22/14 2058 08/22/14 2103      DVT Prophylaxis: Prophylactic Heparin  Code Status: DNR   Family Communication Daughter at bedside  Procedures:  None  CONSULTS:  Ortho  Time spent 45 minutes-which includes 50% of the time with face-to-face with patient/ family and coordinating care related to the above assessment and plan.  MEDICATIONS: Scheduled Meds: . aspirin  81 mg Oral BH-q7a  . ceFEPime (MAXIPIME) IV  1 g Intravenous Q24H  . feeding supplement (ENSURE COMPLETE)  237 mL Oral BID BM  . heparin  5,000 Units Subcutaneous 3 times per day  . predniSONE  5 mg Oral TID  . pyridostigmine  60 mg Oral TID  . simvastatin  5 mg Oral QHS  . tamsulosin  0.4 mg Oral QHS  . vancomycin  1,000 mg Intravenous Q24H   Continuous Infusions:  PRN Meds:.acetaminophen, morphine injection, morphine CONCENTRATE, traMADol    PHYSICAL EXAM: Vital signs in last 24 hours: Filed Vitals:   08/25/14 0554 08/25/14 1430 08/25/14 2149 08/26/14 0613  BP: 135/75 150/53 152/61 154/63  Pulse: 81 87 79 87  Temp: 97.7 F (36.5 C) 98 F (36.7 C) 98 F (36.7 C) 97.4 F (36.3 C)  TempSrc: Oral Oral Oral Oral  Resp: 16 16 18 18   Height:      Weight:      SpO2: 92% 95% 92% 93%      Weight change:  Filed Weights   08/22/14 2233  Weight: 74.526 kg (164 lb 4.8 oz)   Body mass index is 25.73 kg/(m^2).   Gen Exam: Awake and alert with clear speech.  Hard of hearing. Neck: Supple, No JVD.   Chest: B/L Clear.   CVS: S1 S2 Regular, no murmurs.  Abdomen: soft, BS +, non tender, non distended.  Extremities: no edema, lower extremities warm to touch. Right arm +swelling Neurologic: Non Focal.   Skin: No Rash.   Wounds: N/A.   Intake/Output from previous day:  Intake/Output Summary (Last 24 hours) at 08/26/14 1207 Last data filed at 08/26/14 0820  Gross per 24 hour  Intake    630 ml  Output    300 ml  Net    330 ml     LAB RESULTS: CBC  Recent Labs Lab 08/22/14 1844 08/23/14 0806 08/24/14 0735 08/26/14 0350  WBC 12.1* 9.2 8.0 6.6  HGB 7.9* 7.6* 8.7* 8.2*  HCT 24.7* 23.6* 26.7* 25.7*  PLT 243 214 239 260  MCV 92.2 93.7 89.6 89.2  MCH 29.5 30.2 29.2 28.5  MCHC 32.0  32.2 32.6 31.9  RDW 17.1* 16.9* 18.0* 16.9*  LYMPHSABS 0.4*  --   --   --   MONOABS 1.0  --   --   --   EOSABS 0.0  --   --   --   BASOSABS 0.0  --   --   --     Chemistries   Recent Labs Lab 08/22/14 1844 08/23/14 0806  NA 136 137  K 4.5 4.1  CL 104 106  CO2 22 23  GLUCOSE 134* 105*  BUN 23 21  CREATININE 1.38* 1.31  CALCIUM 8.5 8.1*    CBG:  Recent Labs Lab 08/22/14 1820  GLUCAP 141*    GFR Estimated Creatinine Clearance: 29.4 mL/min (by C-G formula based on Cr of 1.31).  Coagulation profile No results for input(s): INR, PROTIME in the last 168 hours.  Cardiac Enzymes  Recent Labs Lab 08/22/14 1844  TROPONINI 0.03    Invalid input(s): POCBNP No results for input(s): DDIMER in the last 72 hours. No results for input(s): HGBA1C in the last 72 hours. No results for input(s): CHOL, HDL, LDLCALC, TRIG, CHOLHDL, LDLDIRECT in the last 72 hours. No results for input(s): TSH, T4TOTAL, T3FREE, THYROIDAB in the last 72 hours.  Invalid input(s):  FREET3  Recent Labs  08/23/14 1635  VITAMINB12 621  FOLATE >20.0  FERRITIN 394*  TIBC 195*  IRON 13*  RETICCTPCT 3.1   No results for input(s): LIPASE, AMYLASE in the last 72 hours.  Urine Studies No results for input(s): UHGB, CRYS in the last 72 hours.  Invalid input(s): UACOL, UAPR, USPG, UPH, UTP, UGL, UKET, UBIL, UNIT, UROB, ULEU, UEPI, UWBC, URBC, UBAC, CAST, UCOM, BILUA  MICROBIOLOGY: Recent Results (from the past 240 hour(s))  Culture, blood (routine x 2) Call MD if unable to obtain prior to antibiotics being given     Status: None (Preliminary result)   Collection Time: 08/22/14  9:14 PM  Result Value Ref Range Status   Specimen Description BLOOD LEFT ARM  Final   Special Requests BOTTLES DRAWN AEROBIC AND ANAEROBIC 10CC EACH  Final   Culture   Final           BLOOD CULTURE RECEIVED NO GROWTH TO DATE CULTURE WILL BE HELD FOR 5 DAYS BEFORE ISSUING A FINAL NEGATIVE REPORT Performed at [facility]    Report Status PENDING  Incomplete  Culture, blood (routine x 2) Call MD if unable to obtain prior to antibiotics being given     Status: None (Preliminary result)   Collection Time: 08/22/14  9:21 PM  Result Value Ref Range Status   Specimen Description BLOOD LEFT HAND  Final   Special Requests BOTTLES DRAWN AEROBIC AND ANAEROBIC 10CC EACH  Final   Culture   Final           BLOOD CULTURE RECEIVED NO GROWTH TO DATE CULTURE WILL BE HELD FOR 5 DAYS BEFORE ISSUING A FINAL NEGATIVE REPORT Performed at [facility]    Report Status PENDING  Incomplete    RADIOLOGY STUDIES/RESULTS: Ct Head Wo Contrast  08/22/2014   CLINICAL DATA:  81 year old male with syncopal episode.  Weakness.  EXAM: CT HEAD WITHOUT CONTRAST  TECHNIQUE: Contiguous axial images were obtained from the base of the skull through the vertex without intravenous contrast.  COMPARISON:  Most recent head CT 07/30/2013  FINDINGS: No acute intracranial hemorrhage. Encephalomalacia and sequela of right  MCA distribution infarct, unchanged in appearance compared to prior exam. Stable atrophy and chronic  small vessel ischemic change. Coarse calcifications midline frontal region and 2 cm in transverse dimension, unchanged from prior exam. No subdural fluid collection. No midline shift. The calvarium is intact. Included paranasal sinuses and mastoid air cells are well aerated. Diminished opacification of right ethmoid air cells from prior.  IMPRESSION: 1. No acute intracranial abnormality. 2. 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 Coarse calcifications in the anterior midline, unchanged from prior exam and likely calcified meningioma. 3. Stable atrophy and chronic small vessel ischemic change.   Electronically Signed   By: Jeb Levering M.D.   On: 08/22/2014 19:42   Dg Chest Port 1 View  08/22/2014   CLINICAL DATA:  Shortness of breath for 1 day  EXAM: PORTABLE CHEST - 1 VIEW  COMPARISON:  07/12/2014  FINDINGS: Focal airspace consolidation in the anterior right upper lobe. Patchy atelectasis in the infrahilar regions. Stable scarring or atelectasis in the left upper lobe extending to the suprahilar region. Heart size upper limits normal. No effusion. Visualized skeletal structures are unremarkable.  IMPRESSION: 1. New anterior right upper lobe infiltrate suggesting pneumonia   Electronically Signed   By: Arne Cleveland M.D.   On: 08/22/2014 19:46   Dg Shoulder Right Port  08/24/2014   CLINICAL DATA:  New 81 year old male with abnormality right humerus. Subsequent encounter.  EXAM: PORTABLE RIGHT SHOULDER - 2+ VIEW  COMPARISON:  08/22/2014 and 07/12/2014.  FINDINGS: Fracture of the midshaft of the right humerus with angulation and mild separation. Underlying lesion (pathologic fracture) may be present.  Progressive consolidation right upper lobe may represent infectious infiltrate and will need to be followed until clearance.  Pulmonary vascular congestion.  Moderate acromioclavicular joint  degenerative changes.  IMPRESSION: Fracture midshaft right humerus with angulation and separation of fracture fragments. This may be related to pathologic fracture as underlying lesion may be present.  Progressive consolidation right upper lobe may represent infectious infiltrate. Follow-up and clearance recommended.  Pulmonary vascular congestion.  These results will be called to the ordering clinician or representative by the Radiologist Assistant, and communication documented in the PACS or zVision Dashboard.   Electronically Signed   By: Chauncey Cruel M.D.   On: 08/24/2014 11:18    Debbe Odea, MD  [facility]  If 7PM-7AM, please contact night-coverage [email or web address] Password [email or web address] 08/26/2014, 12:07 PM   LOS: 4 days

## 2014-08-26 NOTE — Clinical Social Work Note (Addendum)
CSW spoke with the following facilities on this date regarding LOG SNF placement:  [place] Lelan Pons, RN Supervisor)-cannot access until Monday [facility] [facility] ([place])-no longer accepting LOG [place]-pending follow-up [facility] [place] facility has a bed available for patient on Sunday, 08/27/14. Patient's daughter must sign patient into facility.  CSW spoke with MD (Rizwan) and made her aware. CSW spoke with patient's RN who states patient is to have bone scan today and d/c is tentatively set for Sunday 1/10. CSW to speak with patient's daughter Laural Benes regarding the above.  [place], [place] Weekend Clinical Social Worker [phone]

## 2014-08-27 ENCOUNTER — Inpatient Hospital Stay (HOSPITAL_COMMUNITY): Payer: Medicare Other

## 2014-08-27 DIAGNOSIS — C7951 Secondary malignant neoplasm of bone: Secondary | ICD-10-CM

## 2014-08-27 LAB — BASIC METABOLIC PANEL
Anion gap: 9 (ref 5–15)
BUN: 18 mg/dL (ref 6–23)
CHLORIDE: 100 meq/L (ref 96–112)
CO2: 25 mmol/L (ref 19–32)
Calcium: 8.1 mg/dL — ABNORMAL LOW (ref 8.4–10.5)
Creatinine, Ser: 1.14 mg/dL (ref 0.50–1.35)
GFR calc Af Amer: 60 mL/min — ABNORMAL LOW (ref >90)
GFR, EST NON AFRICAN AMERICAN: 52 mL/min — AB (ref >90)
GLUCOSE: 140 mg/dL — AB (ref 70–99)
Potassium: 4.5 mmol/L (ref 3.5–5.1)
SODIUM: 134 mmol/L — AB (ref 135–145)

## 2014-08-27 MED ORDER — FUROSEMIDE 10 MG/ML IJ SOLN
20.0000 mg | Freq: Once | INTRAMUSCULAR | Status: AC
  Administered 2014-08-27: 20 mg via INTRAVENOUS
  Filled 2014-08-27: qty 2

## 2014-08-27 MED ORDER — IPRATROPIUM-ALBUTEROL 0.5-2.5 (3) MG/3ML IN SOLN: 3.0000 mL | Freq: Four times a day (QID) | RESPIRATORY_TRACT | Status: DC | PRN

## 2014-08-27 MED ORDER — ALBUTEROL SULFATE (2.5 MG/3ML) 0.083% IN NEBU: 2.5000 mg | INHALATION_SOLUTION | Freq: Four times a day (QID) | RESPIRATORY_TRACT | Status: DC | PRN

## 2014-08-27 NOTE — Plan of Care (Signed)
Problem: Phase I Progression Outcomes Goal: OOB as tolerated unless otherwise ordered Outcome: Completed/Met Date Met:  08/27/14 Up in recliner and tolerated well.

## 2014-08-27 NOTE — Plan of Care (Signed)
Problem: Phase I Progression Outcomes Goal: Pain controlled with appropriate interventions Outcome: Not Applicable Date Met:  03/90/56 Does not c/o pain.

## 2014-08-27 NOTE — Progress Notes (Signed)
PROGRESS NOTE  Marvin [name] XHB:[id number] DOB: 1914-03-22 DOA: 08/22/2014 PCP: Thressa Sheller, MD  HPI: Patient is a 81 year old Caucasian male with a history of non-small cell lung cancer status post radiation treatment, chronic kidney disease, chronic normocytic anemia, myasthenia gravis admitted on 1/5 with weakness, and exertional dyspnea. Chest x-ray on admission suggestive of pneumonia. Patient admitted and started on intravenous antibiotics, since having significant worsening anemia was transfused 1 unit of PRBC on 08/23/14. After discussion with family, we decided against further investigation including cardiac or pulmonary evaluation. Unfortunately on 08/24/14 while attempting to get up from the bedside commode, patient had significant pain in his right upper extremity. X-ray shows right femur fracture, likely a pathological fracture given his history of lung cancer. Plans are not to pursue any further investigations, have consulted orthopedics for nonsurgical management. I've also consulted palliative care medicine, patient previously from ALF, will require SNF on discharge.  Subjective / 24 H Interval events No complaints this morning, states breathing was "hard" last night  Assessment/Plan: Principal Problem:   HCAP (healthcare-associated pneumonia) Active Problems:   Hypertension   Lung cancer, upper lobe   Myasthenia gravis   Malnutrition of moderate degree  HCAP (healthcare-associated pneumonia): Suspect some aspiration component given history of myasthenia gravis. Today is day 6 of vancomycin and cefepime, continue given wheezing this morning and more dyspnea overnight.  - repeat CXR - Blood cultures on 1/5 negative, urine streptococcal and legionella antigen negative.HIV antibody pending  Shortness of breath: . Suspect exertional shortness of breath due to the combination of pneumonia, deconditioning and anemia, as well as possible fluid overload - repeat CXR this  morning - Transfused 1 unit of PRBC on 1/6.  - Has a history of lung cancer, at risk for VTE. However do not think that further workup including CTA chest, echocardiogram would change overall management, as patient appears very frail and has had history of falls recently.  Subacute blood loss Anemia on top of anemia of chronic kidney disease: Appears to have chronic anemia-? Secondary to chronic disease. Further worsening of hemoglobin probably secondary to recent GI bleed-patient endorses intermittent hematochezia and melena. Rectal exam done by Dr. Wynelle Cleveland stools brown in color but weakly guaiac positive. Not a candidate for endoscopic procedures-family very understanding. Transfused 1 unit of PRBC on 1/6, hemoglobin improved to 8.7. And has been stable  Right humerus fracture: pathologic fracture. Has history of lung cancer, bone scan showing multiple osseous metastases   Myasthenia gravis: Continue Mestinon and prednisone  BPH: Continue Flomax  Chronic kidney disease stage III: Creatinine close to usual baseline  Hypertension: Recently patient off all antihypertensive medications by PCP. BP appears controlled. Follow.  Malnutrition of moderate degree: Continue supplements  Failure to thrive syndrome: DO NOT RESUSCITATE.    Diet: Diet Heart Fluids: none  DVT Prophylaxis: heparin  Code Status: DNR Family Communication: d/w daughter bedside  Disposition Plan: SNF when ready, ?Monday   Consultants:  None   Procedures:  None    Antibiotics Vancomycin 1/5 >> Cefepime 1/5 >>   Studies  Nm Bone Scan Whole Body  08/26/2014   CLINICAL DATA:  Right humeral fracture, possibly pathologic. Evaluate for osseous metastases.  EXAM: NUCLEAR MEDICINE WHOLE BODY BONE SCAN  TECHNIQUE: Whole body anterior and posterior images were obtained approximately 3 hours after intravenous injection of radiopharmaceutical.  RADIOPHARMACEUTICALS:  25 mCi Technetium-99 MDP  COMPARISON:  Right shoulder  radiographs dated 08/24/2014  FINDINGS: Multifocal osseous metastases, including:  --Right mid humerus (corresponding to  site of pathologic fracture)  --Left mid/distal humerus  --T9 and L1 vertebral bodies  --Right posterior 9th rib and multiple left anterior ribs  --Right scapula  --Left proximal femur  IMPRESSION: Multifocal osseous metastases, as above, including the right mid humerus (corresponding to the site of pathologic fracture).   Electronically Signed   By: Julian Hy M.D.   On: 08/26/2014 12:36    Objective  Filed Vitals:   08/26/14 5643 08/26/14 1500 08/26/14 2311 08/27/14 0605  BP: 154/63 128/47 156/98 168/71  Pulse: 87 73 83 81  Temp: 97.4 F (36.3 C) 97.4 F (36.3 C) 97.5 F (36.4 C) 97.7 F (36.5 C)  TempSrc: Oral Oral Oral Oral  Resp: 18 20 20 20   Height:      Weight:      SpO2: 93% 95% 95% 99%    Intake/Output Summary (Last 24 hours) at 08/27/14 0820 Last data filed at 08/27/14 3295  Gross per 24 hour  Intake   2760 ml  Output    701 ml  Net   2059 ml   Filed Weights   08/22/14 2233  Weight: 74.526 kg (164 lb 4.8 oz)    Exam:  General:  NAD, HOH  HEENT: no scleral icterus   Cardiovascular: RRR  Respiratory: scattered wheezing  Abdomen: soft, nontender  Skin: no rashes  Neuro: non focal  Data Reviewed: Basic Metabolic Panel:  Recent Labs Lab 08/22/14 1844 08/23/14 0806 08/27/14 0555  NA 136 137 134*  K 4.5 4.1 4.5  CL 104 106 100  CO2 22 23 25   GLUCOSE 134* 105* 140*  BUN 23 21 18   CREATININE 1.38* 1.31 1.14  CALCIUM 8.5 8.1* 8.1*   Liver Function Tests:  Recent Labs Lab 08/22/14 1844  AST 36  ALT 31  ALKPHOS 106  BILITOT 1.2  PROT 5.5*  ALBUMIN 2.8*   CBC:  Recent Labs Lab 08/22/14 1844 08/23/14 0806 08/24/14 0735 08/26/14 0350  WBC 12.1* 9.2 8.0 6.6  NEUTROABS 10.7*  --   --   --   HGB 7.9* 7.6* 8.7* 8.2*  HCT 24.7* 23.6* 26.7* 25.7*  MCV 92.2 93.7 89.6 89.2  PLT 243 214 239 260   Cardiac  Enzymes:  Recent Labs Lab 08/22/14 1844  TROPONINI 0.03   CBG:  Recent Labs Lab 08/22/14 1820  GLUCAP 141*    Recent Results (from the past 240 hour(s))  Culture, blood (routine x 2) Call MD if unable to obtain prior to antibiotics being given     Status: None (Preliminary result)   Collection Time: 08/22/14  9:14 PM  Result Value Ref Range Status   Specimen Description BLOOD LEFT ARM  Final   Special Requests BOTTLES DRAWN AEROBIC AND ANAEROBIC 10CC EACH  Final   Culture   Final           BLOOD CULTURE RECEIVED NO GROWTH TO DATE CULTURE WILL BE HELD FOR 5 DAYS BEFORE ISSUING A FINAL NEGATIVE REPORT Performed at [facility]    Report Status PENDING  Incomplete  Culture, blood (routine x 2) Call MD if unable to obtain prior to antibiotics being given     Status: None (Preliminary result)   Collection Time: 08/22/14  9:21 PM  Result Value Ref Range Status   Specimen Description BLOOD LEFT HAND  Final   Special Requests BOTTLES DRAWN AEROBIC AND ANAEROBIC 10CC EACH  Final   Culture   Final           BLOOD  CULTURE RECEIVED NO GROWTH TO DATE CULTURE WILL BE HELD FOR 5 DAYS BEFORE ISSUING A FINAL NEGATIVE REPORT Performed at [facility]    Report Status PENDING  Incomplete     Scheduled Meds: . aspirin  81 mg Oral BH-q7a  . ceFEPime (MAXIPIME) IV  1 g Intravenous Q24H  . feeding supplement (ENSURE COMPLETE)  237 mL Oral BID BM  . heparin  5,000 Units Subcutaneous 3 times per day  . predniSONE  5 mg Oral TID  . pyridostigmine  60 mg Oral TID  . simvastatin  5 mg Oral QHS  . tamsulosin  0.4 mg Oral QHS  . vancomycin  1,000 mg Intravenous Q24H   Continuous Infusions:   Marzetta Board, MD [facility] Pager [phone]. If 7 PM - 7 AM, please contact night-coverage at [email or web address], password [facility] 08/27/2014, 8:20 AM  LOS: 5 days

## 2014-08-28 DIAGNOSIS — R531 Weakness: Secondary | ICD-10-CM

## 2014-08-28 MED ORDER — ENSURE COMPLETE PO LIQD: 237.0000 mL | Freq: Two times a day (BID) | ORAL | Status: AC

## 2014-08-28 MED ORDER — MORPHINE SULFATE (CONCENTRATE) 10 MG/0.5ML PO SOLN: 5.0000 mg | ORAL | Status: AC | PRN

## 2014-08-28 MED ORDER — LEVOFLOXACIN 500 MG PO TABS: 500.0000 mg | ORAL_TABLET | Freq: Every day | ORAL | Status: AC

## 2014-08-28 MED ORDER — PREDNISONE 5 MG PO TABS: 5.0000 mg | ORAL_TABLET | Freq: Three times a day (TID) | ORAL | Status: AC

## 2014-08-28 NOTE — Clinical Social Work Note (Addendum)
Patient and family requested SNF in [place] due to patient not having any family in [place].  Insurance authorization has been received.  Patient's information was faxed to [facility], [facility], [facility], and [facility].  Patient had bed offers from different SNFs and patient and family chose to go to [facility].  Patient will be transported via [facility], family did pay for cost of transport, due to facility being more than 50 miles away.  Patient to be d/c'ed today to [place].  Patient and family agreeable to plans will transport via ems RN to call report.  Evette Cristal, MSW, [place]

## 2014-08-28 NOTE — Clinical Social Work Note (Signed)
Received phone call from [facility] with authorization number for ambulance transport.  Authorization number is [id number].  Jones Broom. Rea, MSW, [place] 08/28/2014 4:59 PM

## 2014-08-28 NOTE — Progress Notes (Signed)
Progress Note from the Palliative Medicine Team at [facility]   Marvin [name] is awake and alert today but is complaining about shortness of breath that he says is worse with activity. Right fractured arm hurts only with movement he says. Two daughters and two other visitors are at bedside. Daughter, [name], asks me about the process to get hospice. We discussed this and also MOST form but they are not prepared to complete today. They tell me he is planning to go to rehab today in [place] so he will be close to [name] there (other daughter lives in [place]). Family is very reasonable.    Vinie Sill, NP Palliative Medicine Team Pager # [phone] (M-F 8a-5p) Team Phone # [phone] (Nights/Weekends)

## 2014-08-28 NOTE — Progress Notes (Signed)
ANTIBIOTIC CONSULT NOTE - Follow-up  Pharmacy Consult for cefepime Indication: pneumonia  Allergies  Allergen Reactions  . Hydrocodone     unknown    Patient Measurements: Height: 5\' 7"  (170.2 cm) Weight: 164 lb 4.8 oz (74.526 kg) IBW/kg (Calculated) : 66.1  Vital Signs: Temp: 97.7 F (36.5 C) (01/11 0635) Temp Source: Oral (01/11 0635) BP: 155/60 mmHg (01/11 0635) Pulse Rate: 90 (01/11 0635)Total I/O In: 120 [P.O.:120] Out: -   Labs:  Recent Labs  08/26/14 0350 08/27/14 0555  WBC 6.6  --   HGB 8.2*  --   PLT 260  --   CREATININE  --  1.14    Microbiology: Recent Results (from the past 720 hour(s))  Culture, blood (routine x 2) Call MD if unable to obtain prior to antibiotics being given     Status: None (Preliminary result)   Collection Time: 08/22/14  9:14 PM  Result Value Ref Range Status   Specimen Description BLOOD LEFT ARM  Final   Special Requests BOTTLES DRAWN AEROBIC AND ANAEROBIC 10CC EACH  Final   Culture   Final           BLOOD CULTURE RECEIVED NO GROWTH TO DATE CULTURE WILL BE HELD FOR 5 DAYS BEFORE ISSUING A FINAL NEGATIVE REPORT Performed at [facility]    Report Status PENDING  Incomplete  Culture, blood (routine x 2) Call MD if unable to obtain prior to antibiotics being given     Status: None (Preliminary result)   Collection Time: 08/22/14  9:21 PM  Result Value Ref Range Status   Specimen Description BLOOD LEFT HAND  Final   Special Requests BOTTLES DRAWN AEROBIC AND ANAEROBIC 10CC EACH  Final   Culture   Final           BLOOD CULTURE RECEIVED NO GROWTH TO DATE CULTURE WILL BE HELD FOR 5 DAYS BEFORE ISSUING A FINAL NEGATIVE REPORT Performed at [facility]    Report Status PENDING  Incomplete   Assessment: 82 y.o. male on Cefepime (Day #7) for HCAP given recent hospitalizatoin in November. Pt remains afeb with WBC wnl. SCr down to 1.14, est CrCl ~33 ml/min.   Noted plan home with Levaquin x 2 more days.  Goal of  Therapy:  Resolution of infection  Plan: Cefepime 1 g IV q24 Monitor renal fx, culture D/c home today with Levaquin x 2 more days.  Sherlon Handing, PharmD, BCPS Clinical pharmacist, pager [phone]  08/28/2014 1:17 PM   .

## 2014-08-28 NOTE — Progress Notes (Signed)
Physical Therapy Treatment Patient Details Name: Marvin [name] MRN: [id number] DOB: Apr 22, 1914 Today's Date: 08/28/2014    History of Present Illness Patient is a 81 y/o male who presents to the ED with cough, SOB, chills. Symptoms have been going on for a few days, worse with activity, better at rest. CXR- RUL PNA. S/p Right humerus fx on 1/7 (most likely pathologic based on Xray) s/p transfer to [facility] with nursing. PMH of non-small cell lung cancer s/p radiation treatment, CKD, chronic normocytic anemia and myasthenia gravis. Bone scan-Multifocal osseous metastases including L prox femur, R scapula, R post 9th rib, L distal humerus.    PT Comments    Patient progressing slowly with mobility. Continues to require Mod-Max assist with transfers and standing. Fatigues quickly during static and dynamic standing. Tolerated pre-gait activities in standing with Max A of 2 for balance/weight shifting. Dyspnea noted but able to maintain Sa02 >92% during exercise. Pt with increased risk of fx due to multiple multifocal osseous mets as newly identified by bone scan. Family and pt aware of risks with weightbearing and activity. No activity/WB restrictions in chart. MD aware. Will continue to follow acutely.   Follow Up Recommendations  SNF;Supervision/Assistance - 24 hour     Equipment Recommendations  None recommended by PT    Recommendations for Other Services       Precautions / Restrictions Precautions Precautions: Fall Precaution Comments: Osseous mets in LLE, R scapula, R post ribs, L distal humerus. Required Braces or Orthoses: Sling Other Brace/Splint: Sarmiento brace on RUE s/p humerus fx (tx non operatively). Restrictions Weight Bearing Restrictions: No Other Position/Activity Restrictions: No WB restrictions in chart, assuming NWB RUE.    Mobility  Bed Mobility Overal bed mobility: Needs Assistance Bed Mobility: Supine to Sit;Sit to Supine     Supine to sit: Mod assist;+2  for physical assistance;HOB elevated Sit to supine: Max assist;+2 for physical assistance   General bed mobility comments: Pt able to scoot bottom to EOB with cues for technique. Mod A for trunk elevation. Max A of 2 to return to supine.  Transfers Overall transfer level: Needs assistance Equipment used:  (pole on back of chair.) Transfers: Sit to/from Stand Sit to Stand: Mod assist;+2 physical assistance;Max assist         General transfer comment: Mod A of 2 for first stand, Max A of 2 for second stand due to fatigue using chuck pad to assist with hip extension and cues for hand/foot placement and anterior translation. + wheezing and dyspnea. Sa02 remained >93% on 2L 02 [place].  Ambulation/Gait Ambulation/Gait assistance: Max assist;+2 physical assistance           General Gait Details: Pt able to perform pre gait weightshifting and clearing BLEs with Max A of 2 to assist with weightshifting and balance.   Stairs            Wheelchair Mobility    Modified Rankin (Stroke Patients Only)       Balance Overall balance assessment: Needs assistance Sitting-balance support: Feet supported;Single extremity supported Sitting balance-Leahy Scale: Fair Sitting balance - Comments: Tolerated there ex sitting EOB with posterior lean but no LOB.    Standing balance support: During functional activity Standing balance-Leahy Scale: Zero Standing balance comment: Able to stand for ~1 minute with 1 UE support and therapist supporting hips/trunk. Stood for ~20 sec on second attempt.                    Cognition Arousal/Alertness:  Awake/alert Behavior During Therapy: WFL for tasks assessed/performed Overall Cognitive Status: Within Functional Limits for tasks assessed                      Exercises General Exercises - Lower Extremity Ankle Circles/Pumps: Both;20 reps;Seated Long Arc Quad: Both;20 reps;Seated Hip ABduction/ADduction: Both;20 reps;Seated Hip  Flexion/Marching: Both;20 reps;Seated    General Comments General comments (skin integrity, edema, etc.): Family present during therapy session.      Pertinent Vitals/Pain Pain Assessment: No/denies pain    Home Living                      Prior Function            PT Goals (current goals can now be found in the care plan section) Progress towards PT goals: Progressing toward goals    Frequency  Min 2X/week    PT Plan Current plan remains appropriate    Co-evaluation             End of Session Equipment Utilized During Treatment: Gait belt;Oxygen Activity Tolerance: Patient tolerated treatment well;Patient limited by fatigue Patient left: in bed;with call bell/phone within reach;with bed alarm set;with family/visitor present     Time: 8333-8329 PT Time Calculation (min) (ACUTE ONLY): 25 min  Charges:  $Therapeutic Exercise: 8-22 mins $Therapeutic Activity: 8-22 mins                    G CodesCandy Sledge A 09-16-14, 12:44 PM Candy Sledge, [place], DPT [phone]

## 2014-08-28 NOTE — Discharge Summary (Addendum)
Physician Discharge Summary  Marvin [name] OEH:[id number] DOB: [birth year] DOA: 08/22/2014  PCP: Thressa Sheller, MD  Admit date: 08/22/2014 Discharge date: 08/28/2014  Time spent: 45 minutes  Recommendations for Outpatient Follow-up:  1. Follow up with PCP in 1-2 weeks  2. Continue Levofloxacin for 2 additional days   Discharge Diagnoses:  Principal Problem:   HCAP (healthcare-associated pneumonia) Active Problems:   Hypertension   Lung cancer, upper lobe   Myasthenia gravis   Generalized weakness   Malnutrition of moderate degree   Palliative care encounter   Pain of right arm   Discharge Condition: stable  Diet recommendation: regular, as tolerated   Filed Weights   08/22/14 2233  Weight: 74.526 kg (164 lb 4.8 oz)   History of present illness:  Marvin [name] is a 81 y.o. male who presents to the ED with cough, SOB, chills. Symptoms have been going on for a few days, worse with activity, better at rest. Cough is non-productive. Patient complains of generalized weakness. No chest pain. No abdominal pain. No headaches, no rash  Hospital Course:  HCAP (healthcare-associated pneumonia): Suspect some aspiration component given history of myasthenia gravis. He has received 6 days of vancomycin and cefepime, stable, improved respiratory status, will transition to levofloxacin for 2 additional days  - Blood cultures on 1/5 negative, urine streptococcal and legionella antigen negative.HIV antibody pending Shortness of breath: . Suspect exertional shortness of breath due to the combination of pneumonia, deconditioning and anemia, as well as possible fluid overload. Patient's breathing improved with antibiotics and also received Lasix x 2. Continue to monitor fluid status and consider using Lasix as needed for fluid overload. Consider repeating CXR in 2-3 weeks to ensure PNA resolution.  Subacute blood loss Anemia on top of anemia of chronic kidney disease: Appears to  have chronic anemia-? Secondary to chronic disease. Further worsening of hemoglobin probably secondary to recent GI bleed-patient endorses intermittent hematochezia and melena. Rectal exam done by Dr. Wynelle Cleveland stools brown in color but weakly guaiac positive. Not a candidate for endoscopic procedures-family very understanding and in agreement. Transfused 1 unit of PRBC on 1/6, hemoglobin improved to 8.7 and has remained stable. Would recommend holding Aspirin for now, repeat CBC in 1 week and if stable to consider resuming aspirin. Lung cancer - patient's humerus fracture appears to be pathologic, bone scan with multiple osseous metastasis, poor overall prognosis. Right humerus fracture: pathologic fracture, continue conservative management. Myasthenia gravis: Continue Mestinon and prednisone BPH: Continue Flomax Chronic kidney disease stage III: Creatinine close to usual baseline Hypertension: Recently patient off all antihypertensive medications by PCP. BP appears controlled. Follow. Malnutrition of moderate degree: Continue supplements  Procedures:  None    Consultations:  None   Discharge Exam: Filed Vitals:   08/27/14 0605 08/27/14 1428 08/27/14 2103 08/28/14 0635  BP: 168/71 147/116 148/58 155/60  Pulse: 81 76 82 90  Temp: 97.7 F (36.5 C) 97.6 F (36.4 C) 97.8 F (36.6 C) 97.7 F (36.5 C)  TempSrc: Oral Oral Oral Oral  Resp: 20 18 19 17   Height:      Weight:      SpO2: 99% 97% 98% 97%    General: NAD Cardiovascular: RRR Respiratory: CTA biL  Discharge Instructions     Medication List    STOP taking these medications        aspirin 81 MG chewable tablet      TAKE these medications        acetaminophen 325 MG tablet  Commonly known as:  TYLENOL  Take 2 tablets (650 mg total) by mouth every 6 (six) hours as needed for mild pain (or Fever >/= 101).     feeding supplement (ENSURE COMPLETE) Liqd  Take 237 mLs by mouth 2 (two) times daily between meals.      levofloxacin 500 MG tablet  Commonly known as:  LEVAQUIN  Take 1 tablet (500 mg total) by mouth daily.     morphine CONCENTRATE 10 MG/0.5ML Soln concentrated solution  Take 0.25 mLs (5 mg total) by mouth every 4 (four) hours as needed for moderate pain.     multivitamin with minerals Tabs tablet  Take 1 tablet by mouth every morning.     predniSONE 5 MG tablet  Commonly known as:  DELTASONE  Take 1 tablet (5 mg total) by mouth 3 (three) times daily.     pyridostigmine 60 MG tablet  Commonly known as:  MESTINON  Take 1 tablet (60 mg total) by mouth 3 (three) times daily.     simvastatin 5 MG tablet  Commonly known as:  ZOCOR  Take 5 mg by mouth at bedtime.     tamsulosin 0.4 MG Caps capsule  Commonly known as:  FLOMAX  Take 0.4 mg by mouth at bedtime.     Vitamin D (Ergocalciferol) 50000 UNITS Caps capsule  Commonly known as:  DRISDOL  Take 50,000 Units by mouth every 7 (seven) days. On Sundays         The results of significant diagnostics from this hospitalization (including imaging, microbiology, ancillary and laboratory) are listed below for reference.    Significant Diagnostic Studies: Ct Head Wo Contrast  08/22/2014   CLINICAL DATA:  81 year old male with syncopal episode.  Weakness.  EXAM: CT HEAD WITHOUT CONTRAST  TECHNIQUE: Contiguous axial images were obtained from the base of the skull through the vertex without intravenous contrast.  COMPARISON:  Most recent head CT 07/30/2013  FINDINGS: No acute intracranial hemorrhage. Encephalomalacia and sequela of right MCA distribution infarct, unchanged in appearance compared to prior exam. Stable atrophy and chronic small vessel ischemic change. Coarse calcifications midline frontal region and 2 cm in transverse dimension, unchanged from prior exam. No subdural fluid collection. No midline shift. The calvarium is intact. Included paranasal sinuses and mastoid air cells are well aerated. Diminished opacification of right ethmoid  air cells from prior.  IMPRESSION: 1. No acute intracranial abnormality. 2. 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 Coarse calcifications in the anterior midline, unchanged from prior exam and likely calcified meningioma. 3. Stable atrophy and chronic small vessel ischemic change.   Electronically Signed   By: Jeb Levering M.D.   On: 08/22/2014 19:42   Nm Bone Scan Whole Body  08/26/2014   CLINICAL DATA:  Right humeral fracture, possibly pathologic. Evaluate for osseous metastases.  EXAM: NUCLEAR MEDICINE WHOLE BODY BONE SCAN  TECHNIQUE: Whole body anterior and posterior images were obtained approximately 3 hours after intravenous injection of radiopharmaceutical.  RADIOPHARMACEUTICALS:  25 mCi Technetium-99 MDP  COMPARISON:  Right shoulder radiographs dated 08/24/2014  FINDINGS: Multifocal osseous metastases, including:  --Right mid humerus (corresponding to site of pathologic fracture)  --Left mid/distal humerus  --T9 and L1 vertebral bodies  --Right posterior 9th rib and multiple left anterior ribs  --Right scapula  --Left proximal femur  IMPRESSION: Multifocal osseous metastases, as above, including the right mid humerus (corresponding to the site of pathologic fracture).   Electronically Signed   By: Julian Hy M.D.   On:  08/26/2014 12:36   Dg Chest Port 1 View  08/27/2014   CLINICAL DATA:  Shortness of breath  EXAM: PORTABLE CHEST - 1 VIEW  COMPARISON:  08/22/2014  FINDINGS: Increasing patchy opacity in the right mid lung and left lung apex, suspicious for multifocal pneumonia.  Additional patchy retrocardiac opacity, atelectasis versus pneumonia.  No pleural effusion or pneumothorax.  Cardiomegaly.  IMPRESSION: Increasing multifocal patchy opacities, suspicious for pneumonia.  Follow-up chest radiographs are suggested to document resolution.a   Electronically Signed   By: Julian Hy M.D.   On: 08/27/2014 15:05   Dg Chest Port 1 View  08/22/2014   CLINICAL DATA:  Shortness of  breath for 1 day  EXAM: PORTABLE CHEST - 1 VIEW  COMPARISON:  07/12/2014  FINDINGS: Focal airspace consolidation in the anterior right upper lobe. Patchy atelectasis in the infrahilar regions. Stable scarring or atelectasis in the left upper lobe extending to the suprahilar region. Heart size upper limits normal. No effusion. Visualized skeletal structures are unremarkable.  IMPRESSION: 1. New anterior right upper lobe infiltrate suggesting pneumonia   Electronically Signed   By: Arne Cleveland M.D.   On: 08/22/2014 19:46   Dg Shoulder Right Port  08/24/2014   CLINICAL DATA:  New 81 year old male with abnormality right humerus. Subsequent encounter.  EXAM: PORTABLE RIGHT SHOULDER - 2+ VIEW  COMPARISON:  08/22/2014 and 07/12/2014.  FINDINGS: Fracture of the midshaft of the right humerus with angulation and mild separation. Underlying lesion (pathologic fracture) may be present.  Progressive consolidation right upper lobe may represent infectious infiltrate and will need to be followed until clearance.  Pulmonary vascular congestion.  Moderate acromioclavicular joint degenerative changes.  IMPRESSION: Fracture midshaft right humerus with angulation and separation of fracture fragments. This may be related to pathologic fracture as underlying lesion may be present.  Progressive consolidation right upper lobe may represent infectious infiltrate. Follow-up and clearance recommended.  Pulmonary vascular congestion.  These results will be called to the ordering clinician or representative by the Radiologist Assistant, and communication documented in the PACS or zVision Dashboard.   Electronically Signed   By: Chauncey Cruel M.D.   On: 08/24/2014 11:18    Microbiology: Recent Results (from the past 240 hour(s))  Culture, blood (routine x 2) Call MD if unable to obtain prior to antibiotics being given     Status: None (Preliminary result)   Collection Time: 08/22/14  9:14 PM  Result Value Ref Range Status   Specimen  Description BLOOD LEFT ARM  Final   Special Requests BOTTLES DRAWN AEROBIC AND ANAEROBIC 10CC EACH  Final   Culture   Final           BLOOD CULTURE RECEIVED NO GROWTH TO DATE CULTURE WILL BE HELD FOR 5 DAYS BEFORE ISSUING A FINAL NEGATIVE REPORT Performed at [facility]    Report Status PENDING  Incomplete  Culture, blood (routine x 2) Call MD if unable to obtain prior to antibiotics being given     Status: None (Preliminary result)   Collection Time: 08/22/14  9:21 PM  Result Value Ref Range Status   Specimen Description BLOOD LEFT HAND  Final   Special Requests BOTTLES DRAWN AEROBIC AND ANAEROBIC 10CC EACH  Final   Culture   Final           BLOOD CULTURE RECEIVED NO GROWTH TO DATE CULTURE WILL BE HELD FOR 5 DAYS BEFORE ISSUING A FINAL NEGATIVE REPORT Performed at [facility]    Report Status  PENDING  Incomplete     Labs: Basic Metabolic Panel:  Recent Labs Lab 08/22/14 1844 08/23/14 0806 08/27/14 0555  NA 136 137 134*  K 4.5 4.1 4.5  CL 104 106 100  CO2 22 23 25   GLUCOSE 134* 105* 140*  BUN 23 21 18   CREATININE 1.38* 1.31 1.14  CALCIUM 8.5 8.1* 8.1*   Liver Function Tests:  Recent Labs Lab 08/22/14 1844  AST 36  ALT 31  ALKPHOS 106  BILITOT 1.2  PROT 5.5*  ALBUMIN 2.8*   CBC:  Recent Labs Lab 08/22/14 1844 08/23/14 0806 08/24/14 0735 08/26/14 0350  WBC 12.1* 9.2 8.0 6.6  NEUTROABS 10.7*  --   --   --   HGB 7.9* 7.6* 8.7* 8.2*  HCT 24.7* 23.6* 26.7* 25.7*  MCV 92.2 93.7 89.6 89.2  PLT 243 214 239 260   Cardiac Enzymes:  Recent Labs Lab 08/22/14 1844  TROPONINI 0.03   CBG:  Recent Labs Lab 08/22/14 1820  GLUCAP 141*       Signed:  Rayvon Dakin  [facility] 08/28/2014, 10:19 AM

## 2014-08-28 NOTE — Progress Notes (Signed)
OT Cancellation Note  Patient Details Name: Marvin [name] MRN: [id number] DOB: 04/30/1914   Cancelled Treatment:    Reason Eval/Treat Not Completed: Other (comment) Pt is Medicare and current D/C plan is SNF. No apparent immediate acute care OT needs, therefore will defer OT to SNF. If OT eval is needed please call Acute Rehab Dept. at [phone] or text page OT at [phone].  Dane, OTR/L  [phone] 08/28/2014 08/28/2014, 8:33 AM

## 2014-08-29 ENCOUNTER — Ambulatory Visit: Payer: Medicare Other | Admitting: Neurology

## 2014-08-29 LAB — CULTURE, BLOOD (ROUTINE X 2)
Culture: NO GROWTH
Culture: NO GROWTH

## 2014-08-29 LAB — HIV ANTIBODY (ROUTINE TESTING W REFLEX): HIV-1/HIV-2 Ab: NONREACTIVE

## 2014-09-15 ENCOUNTER — Telehealth (HOSPITAL_COMMUNITY): Payer: Self-pay | Admitting: *Deleted

## 2014-09-18 DEATH — deceased

## 2014-09-29 ENCOUNTER — Ambulatory Visit: Payer: Self-pay | Admitting: Podiatry

## 2014-10-26 ENCOUNTER — Ambulatory Visit: Payer: Medicare Other | Admitting: Radiation Oncology

## 2014-10-26 ENCOUNTER — Ambulatory Visit
Admission: RE | Admit: 2014-10-26 | Discharge: 2014-10-26 | Disposition: A | Discharge status: Home / Self Care | Payer: Medicare Other | Source: Ambulatory Visit | Attending: Radiation Oncology | Admitting: Radiation Oncology

## 2014-10-26 DIAGNOSIS — C3412 Malignant neoplasm of upper lobe, left bronchus or lung: Secondary | ICD-10-CM

## 2015-04-07 IMAGING — CR DG SHOULDER 2+V PORT*R*
1 series · 1 of 1 positions shown · non-contrast
Comparison: 08/22/2014 and 07/12/2014.

CLINICAL DATA: New [AGE] male with abnormality right humerus.
Subsequent encounter.

EXAM:
PORTABLE RIGHT SHOULDER - 2+ VIEW

[AP]
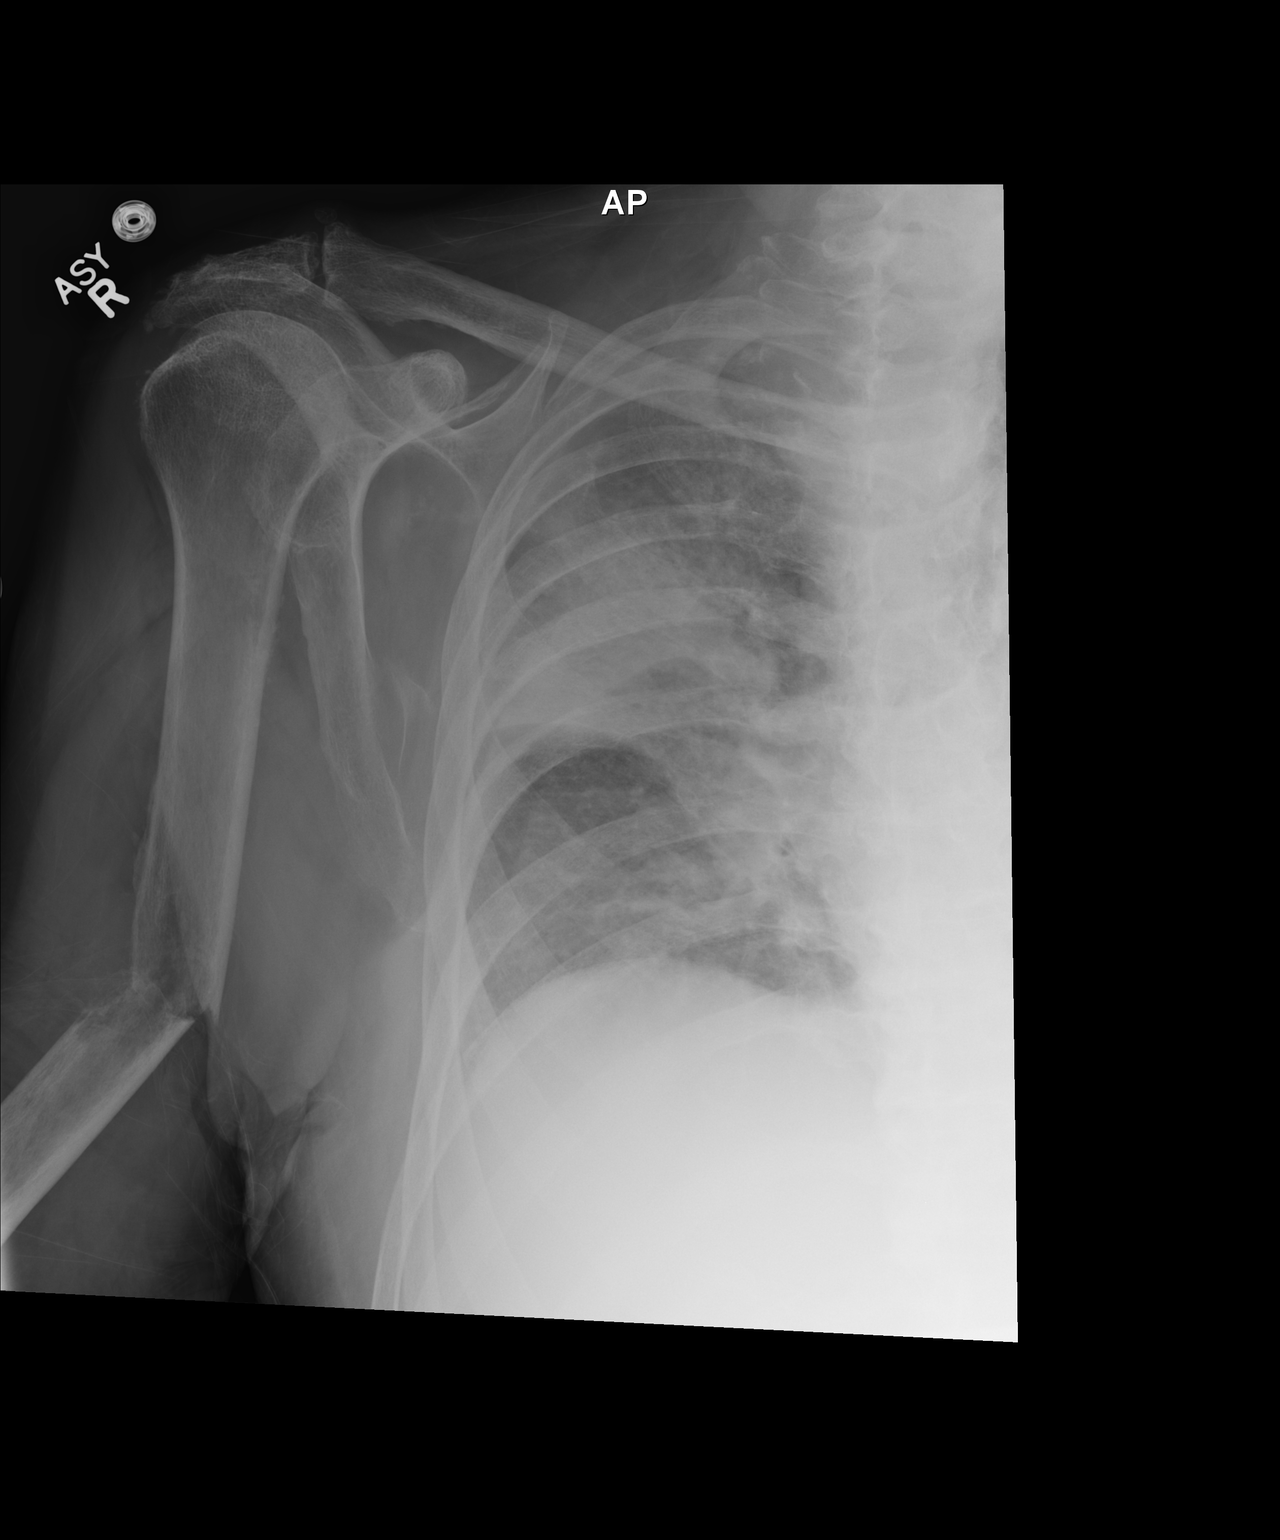

[1 of 1 positions shown; findings below may reference images not displayed]

FINDINGS: Fracture of the midshaft of the right humerus with angulation and
mild separation. Underlying lesion (pathologic fracture) may be
present.

Progressive consolidation right upper lobe may represent infectious
infiltrate and will need to be followed until clearance.

Pulmonary vascular congestion.

Moderate acromioclavicular joint degenerative changes.
IMPRESSION: Fracture midshaft right humerus with angulation and separation of
fracture fragments. This may be related to pathologic fracture as
underlying lesion may be present.

Progressive consolidation right upper lobe may represent infectious
infiltrate. Follow-up and clearance recommended.

Pulmonary vascular congestion.

These results will be called to the ordering clinician or
representative by the Radiologist Assistant, and communication
documented in the PACS or zVision Dashboard.
